# Patient Record
Sex: Female | Born: 1975 | Race: Black or African American | Hispanic: No | Marital: Single | Smoking: Never smoker
Health system: Southern US, Community
[De-identification: ages and names within clinical notes are randomized; demographics above are authoritative.]

## PROBLEM LIST (undated history)

## (undated) DIAGNOSIS — I8390 Asymptomatic varicose veins of unspecified lower extremity: Secondary | ICD-10-CM

## (undated) DIAGNOSIS — G43109 Migraine with aura, not intractable, without status migrainosus: Secondary | ICD-10-CM

## (undated) DIAGNOSIS — E119 Type 2 diabetes mellitus without complications: Secondary | ICD-10-CM

## (undated) HISTORY — DX: Asymptomatic varicose veins of unspecified lower extremity: I83.90

---

## 2010-09-26 ENCOUNTER — Emergency Department (HOSPITAL_COMMUNITY)
Admission: EM | Admit: 2010-09-26 | Discharge: 2010-09-27 | Disposition: A | Discharge status: Home / Self Care | Payer: 59 | Attending: Emergency Medicine | Admitting: Emergency Medicine

## 2010-09-26 DIAGNOSIS — M5430 Sciatica, unspecified side: Secondary | ICD-10-CM | POA: Insufficient documentation

## 2010-09-26 DIAGNOSIS — M545 Low back pain, unspecified: Secondary | ICD-10-CM | POA: Insufficient documentation

## 2010-09-26 DIAGNOSIS — N390 Urinary tract infection, site not specified: Secondary | ICD-10-CM | POA: Insufficient documentation

## 2010-09-27 LAB — URINE MICROSCOPIC-ADD ON

## 2010-09-27 LAB — URINALYSIS, ROUTINE W REFLEX MICROSCOPIC
Bilirubin Urine: NEGATIVE
Specific Gravity, Urine: 1.029 (ref 1.005–1.030)
Urobilinogen, UA: 0.2 mg/dL (ref 0.0–1.0)
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
## 2011-03-10 ENCOUNTER — Encounter: Payer: Self-pay | Admitting: Vascular Surgery

## 2011-03-12 ENCOUNTER — Other Ambulatory Visit: Payer: Self-pay

## 2011-03-12 DIAGNOSIS — I83893 Varicose veins of bilateral lower extremities with other complications: Secondary | ICD-10-CM

## 2011-03-20 ENCOUNTER — Encounter: Payer: Self-pay | Admitting: Vascular Surgery

## 2011-03-23 ENCOUNTER — Ambulatory Visit (INDEPENDENT_AMBULATORY_CARE_PROVIDER_SITE_OTHER): Payer: 59 | Admitting: Vascular Surgery

## 2011-03-23 ENCOUNTER — Encounter: Payer: Self-pay | Admitting: Vascular Surgery

## 2011-03-23 VITALS — BP 125/85 | HR 83 | Resp 18 | Ht 62.0 in | Wt 152.0 lb

## 2011-03-23 DIAGNOSIS — I83893 Varicose veins of bilateral lower extremities with other complications: Secondary | ICD-10-CM

## 2011-03-23 DIAGNOSIS — M79609 Pain in unspecified limb: Secondary | ICD-10-CM

## 2011-03-23 NOTE — Progress Notes (Signed)
Subjective:     Patient ID: Susan Guerrero, female   DOB: 1975-08-24, 35 y.o.   MRN: [id number]  [facility] [place] HISTORY AND PHYSICAL  Referring Physician:Dr Rankins History of Present Illness: This 35 year old healthy female was referred by Dr. Ephriam Knuckles for evaluation of venous insufficiency of both legs. 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 She has been having increasing aching throbbing and burning discomfort in both legs in the medial thigh and the back of the calf area bilaterally. She stands for 8-10 hours per day. She has recently started wearing short-leg elastic compression stockings with no improvement in her symptoms has no history of DVT or documented thrombophlebitis. He has no history of stasis ulcers or bleeding. He does have mild swelling in the ankles as the day progresses. Past Medical History  Diagnosis Date  . Asthma   . Varicose veins     ROS: [x]  Positive   [ ]  Negative   [ ]  All sytems reviewed and are negative  General: [ ]  Weight loss, [ ]  Fever, [ ]  chills Neurologic: [ ]  Dizziness, [ ]  Blackouts, [ ]  Seizure [ ]  Stroke, [ ]  "Mini stroke", [ ]  Slurred speech, [ ]  Temporary blindness; [ ]  weakness in arms or legs, [ ]  Hoarseness Cardiac: [ ]  Chest pain/pressure, [ ]  Shortness of breath at rest [x ] Shortness of breath with exertion, [ ]  Atrial fibrillation or irregular heartbeat Vascular: [ ]  Pain in legs with walking, [ ]  Pain in legs at rest, [ ]  Pain in legs at night,  [ ]  Non-healing ulcer, [ ]  Blood clot in vein/DVT,   Pulmonary: [ ]  Home oxygen, [ ]  Productive cough, [ ]  Coughing up blood, [x ] Asthma,  [x ] Wheezing Musculoskeletal:  [ ]  Arthritis, [ ]  Low back pain, [x ] Joint pain Hematologic: [ ]  Easy Bruising, [ ]  Anemia; [ ]  Hepatitis Gastrointestinal: [ ]  Blood in stool, [ ]  Gastroesophageal Reflux/heartburn, [ ]  Trouble swallowing Urinary: [ ]  chronic Kidney disease, [ ]  on HD - [ ]  MWF or [ ]   TTHS, [ ]  Burning with urination, [ ]  Difficulty urinating Skin: [ ]  Rashes, [ ]  Wounds Psychological: [ ]  Anxiety, [ ]  Depression   Social History History  Substance Use Topics  . Smoking status: Never Smoker   . Smokeless tobacco: Not on file  . Alcohol Use: Yes     occasionally    Family History Family History  Problem Relation Age of Onset  . Hypertension Father   . Migraines Sister   . Asthma Brother   . Obesity Brother   . Cancer Maternal Aunt     ovarian  . Cancer Maternal Grandmother     breast    Allergies  Allergen Reactions  . Flagyl (Metronidazole Hcl)     Causes stomach pain    Current Outpatient Prescriptions  Medication Sig Dispense Refill  . albuterol (VENTOLIN HFA) 108 (90 BASE) MCG/ACT inhaler Inhale 2 puffs into the lungs every 4 (four) hours as needed.        . Fluticasone-Salmeterol (ADVAIR) 100-50 MCG/DOSE AEPB Inhale 1 puff into the lungs every 12 (twelve) hours.        . naproxen sodium (ANAPROX) 220 MG tablet Take 220 mg by mouth as needed.        . clindamycin (CLEOCIN) 2 % vaginal cream Place 1 applicator vaginally at bedtime.        Marland Kitchen  omeprazole (PRILOSEC) 40 MG capsule Take 40 mg by mouth daily.        . prochlorperazine (COMPAZINE) 25 MG suppository Place 25 mg rectally every 12 (twelve) hours as needed.          Physical Examination  Filed Vitals:   03/23/11 1225  BP: 125/85  Pulse: 83  Resp: 18    Body mass index is 27.80 kg/(m^2).  General:  WDWN in NAD Gait: Normal HENT: WNL Eyes: Pupils equal Pulmonary: normal non-labored breathing , without Rales, rhonchi,  wheezing Cardiac: RRR, without  Murmurs, rubs or gallops; No carotid bruits Abdomen: soft, NT, no masses Skin: no rashes, ulcers noted Vascular Exam/Pulses: 23+ femoral popliteal and dorsalis pedis pulses palpable bilaterally. No significant distal edema is noted. She has some small reticular veins over the course of the great saphenous vein in the distal thigh left  worse than right. No bulging varicosities are noted. No hyperpigmentation or ulceration is noted. Extremities without ischemic changes, no Gangrene , no cellulitis; no open wounds;  Musculoskeletal: no muscle wasting or atrophy  Neurologic: A&O X 3; Appropriate Affect ; SENSATION: normal; MOTOR FUNCTION:  moving all extremities equally. Speech is fluent/normal  Non-Invasive Vascular Imaging: Today however a venous duplex exam of both lower extremities which I reviewed and interpreted to deep systems are essentially normal with the exception of mild reflux on the left side. There is some reflex in the right superficial system and the great saphenous vein but this vein is small. The left great saphenous vein and small saphenous veins are free of reflux.  ASSESSMENT: Symptomatic reticular veins both lower extremities in the medial thigh areas. PLAN: Patient will consider sclerotherapy for treatment of these areas. No laser ablation of greater saphenous or small saphenous systems is indicated   Review of Systems     Objective:   Physical Exam     Assessment:         Plan:

## 2011-03-23 NOTE — Progress Notes (Signed)
New VV

## 2011-03-30 NOTE — Procedures (Unsigned)
LOWER EXTREMITY VENOUS REFLUX EXAM  INDICATION:  Lower extremity varicose veins and lower extremity pain.  EXAM:  Using color-flow imaging and pulse Doppler spectral analysis, the bilateral common femoral, superficial femoral, popliteal, posterior tibial, greater and lesser saphenous veins are evaluated.  There is evidence suggesting deep venous insufficiency in the left popliteal vein.  The remainder appears normal without reflux.  The right saphenofemoral junction is not competent with Reflux of >582milliseconds. The left saphenofemoral junction is competent.  The right GSV is not competent with reflux >500 milliseconds with the caliber as described below.  The left GSV is competent.  The bilateral proximal small saphenous veins demonstrate competency.  GSV Diameter (used if found to be incompetent only)                                           Right    Left Proximal Greater Saphenous Vein           0.51 cm  cm Proximal-to-mid-thigh                     0.23 cm  cm Mid thigh                                 0.25 cm  cm Mid-distal thigh                          cm       cm Distal thigh                              0.35 cm  cm Knee                                      0.37 cm  cm  IMPRESSION: 1. The right greater saphenous vein is not competent with reflux     >57milliseconds. 2. The left greater saphenous vein is competent. 3. The bilateral greater saphenous veins are not tortuous. 4. The deep venous system of the right lower extremity is competent. 5. The deep venous system of the left lower extremity is not competent     with reflux >500 milliseconds. 6. The bilateral small saphenous veins are competent.  ___________________________________________ Quita Skye. Hart Rochester, M.D.  SH/MEDQ  D:  03/23/2011  T:  03/23/2011  Job:  [id number]

## 2012-08-21 ENCOUNTER — Emergency Department (HOSPITAL_COMMUNITY): Payer: 59

## 2012-08-21 ENCOUNTER — Encounter (HOSPITAL_COMMUNITY): Payer: Self-pay | Admitting: Emergency Medicine

## 2012-08-21 ENCOUNTER — Emergency Department (HOSPITAL_COMMUNITY)
Admission: EM | Admit: 2012-08-21 | Discharge: 2012-08-21 | Disposition: A | Discharge status: Home / Self Care | Payer: 59 | Attending: Emergency Medicine | Admitting: Emergency Medicine

## 2012-08-21 DIAGNOSIS — J45909 Unspecified asthma, uncomplicated: Secondary | ICD-10-CM | POA: Insufficient documentation

## 2012-08-21 DIAGNOSIS — S60511A Abrasion of right hand, initial encounter: Secondary | ICD-10-CM

## 2012-08-21 DIAGNOSIS — Z8679 Personal history of other diseases of the circulatory system: Secondary | ICD-10-CM | POA: Insufficient documentation

## 2012-08-21 DIAGNOSIS — S32591A Other specified fracture of right pubis, initial encounter for closed fracture: Secondary | ICD-10-CM

## 2012-08-21 DIAGNOSIS — S32509A Unspecified fracture of unspecified pubis, initial encounter for closed fracture: Secondary | ICD-10-CM | POA: Insufficient documentation

## 2012-08-21 DIAGNOSIS — IMO0002 Reserved for concepts with insufficient information to code with codable children: Secondary | ICD-10-CM | POA: Insufficient documentation

## 2012-08-21 DIAGNOSIS — Z792 Long term (current) use of antibiotics: Secondary | ICD-10-CM | POA: Insufficient documentation

## 2012-08-21 DIAGNOSIS — S0990XA Unspecified injury of head, initial encounter: Secondary | ICD-10-CM | POA: Insufficient documentation

## 2012-08-21 DIAGNOSIS — Z79899 Other long term (current) drug therapy: Secondary | ICD-10-CM | POA: Insufficient documentation

## 2012-08-21 DIAGNOSIS — Z23 Encounter for immunization: Secondary | ICD-10-CM | POA: Insufficient documentation

## 2012-08-21 DIAGNOSIS — Y9389 Activity, other specified: Secondary | ICD-10-CM | POA: Insufficient documentation

## 2012-08-21 DIAGNOSIS — Y92410 Unspecified street and highway as the place of occurrence of the external cause: Secondary | ICD-10-CM | POA: Insufficient documentation

## 2012-08-21 LAB — POCT I-STAT, CHEM 8
BUN: 9 mg/dL (ref 6–23)
Chloride: 110 mEq/L (ref 96–112)
Potassium: 3.4 mEq/L — ABNORMAL LOW (ref 3.5–5.1)
Sodium: 142 mEq/L (ref 135–145)
TCO2: 21 mmol/L (ref 0–100)

## 2012-08-21 LAB — COMPREHENSIVE METABOLIC PANEL
ALT: 18 U/L (ref 0–35)
AST: 37 U/L (ref 0–37)
CO2: 20 mEq/L (ref 19–32)
Calcium: 9 mg/dL (ref 8.4–10.5)
Sodium: 140 mEq/L (ref 135–145)
Total Protein: 7.1 g/dL (ref 6.0–8.3)

## 2012-08-21 LAB — CBC
HCT: 39.2 % (ref 36.0–46.0)
Hemoglobin: 13.6 g/dL (ref 12.0–15.0)
MCH: 29.3 pg (ref 26.0–34.0)
MCHC: 34.7 g/dL (ref 30.0–36.0)
MCV: 84.5 fL (ref 78.0–100.0)

## 2012-08-21 LAB — CG4 I-STAT (LACTIC ACID): Lactic Acid, Venous: 2.94 mmol/L — ABNORMAL HIGH (ref 0.5–2.2)

## 2012-08-21 LAB — URINALYSIS, MICROSCOPIC ONLY
Glucose, UA: NEGATIVE mg/dL
Ketones, ur: NEGATIVE mg/dL
Protein, ur: NEGATIVE mg/dL

## 2012-08-21 LAB — SAMPLE TO BLOOD BANK

## 2012-08-21 LAB — PROTIME-INR: Prothrombin Time: 12.9 seconds (ref 11.6–15.2)
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
## 2012-08-21 MED ORDER — SODIUM CHLORIDE 0.9 % IV SOLN
Freq: Once | INTRAVENOUS | Status: AC
  Administered 2012-08-21: 21:00:00 via INTRAVENOUS

## 2012-08-21 MED ORDER — LORAZEPAM 2 MG/ML IJ SOLN
1.0000 mg | Freq: Once | INTRAMUSCULAR | Status: DC
  Filled 2012-08-21: qty 1

## 2012-08-21 MED ORDER — MORPHINE SULFATE 4 MG/ML IJ SOLN
4.0000 mg | Freq: Once | INTRAMUSCULAR | Status: AC
  Administered 2012-08-21: 4 mg via INTRAVENOUS
  Filled 2012-08-21: qty 1

## 2012-08-21 MED ORDER — TETANUS-DIPHTH-ACELL PERTUSSIS 5-2.5-18.5 LF-MCG/0.5 IM SUSP
0.5000 mL | Freq: Once | INTRAMUSCULAR | Status: AC
Start: 2012-08-21 — End: 2012-08-21
  Administered 2012-08-21: 0.5 mL via INTRAMUSCULAR
  Filled 2012-08-21: qty 0.5

## 2012-08-21 MED ORDER — KETOROLAC TROMETHAMINE 30 MG/ML IJ SOLN
15.0000 mg | Freq: Once | INTRAMUSCULAR | Status: AC
  Administered 2012-08-21: 15 mg via INTRAVENOUS
  Filled 2012-08-21: qty 1

## 2012-08-21 MED ORDER — FENTANYL CITRATE 0.05 MG/ML IJ SOLN
50.0000 ug | Freq: Once | INTRAMUSCULAR | Status: AC
  Administered 2012-08-21: 50 ug via INTRAVENOUS
  Filled 2012-08-21: qty 2

## 2012-08-21 MED ORDER — SODIUM CHLORIDE 0.9 % IV BOLUS (SEPSIS)
1000.0000 mL | Freq: Once | INTRAVENOUS | Status: AC
  Administered 2012-08-21: 1000 mL via INTRAVENOUS

## 2012-08-21 MED ORDER — OXYCODONE-ACETAMINOPHEN 5-325 MG PO TABS: 1.0000 | ORAL_TABLET | ORAL | Status: DC | PRN

## 2012-08-21 MED ORDER — IOHEXOL 300 MG/ML  SOLN
100.0000 mL | Freq: Once | INTRAMUSCULAR | Status: AC | PRN
  Administered 2012-08-21: 100 mL via INTRAVENOUS
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
## 2012-08-21 NOTE — ED Notes (Signed)
Pt in xray

## 2012-08-21 NOTE — ED Notes (Signed)
Alert, NAD, calm, interactive, resps e/u, no changes, MAEx4, CMS intact, LS CTA, "feels about the same", pain med given, hand cleaned, dressed and bandaged. (saline, betadine, bacitracin, xeroform, telfa, gauze, kling and coban).
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
## 2012-08-21 NOTE — ED Notes (Signed)
Rx x 1.  Pt voiced understanding to f/u with PCP on Thursday and return for worsening condition.

## 2012-08-21 NOTE — ED Notes (Signed)
Dr. Bebe Shaggy in to room at [facility]

## 2012-08-21 NOTE — ED Notes (Signed)
Patient transported to CT 

## 2012-08-21 NOTE — ED Notes (Signed)
Patient has menstrual cycle at this time.

## 2012-08-22 ENCOUNTER — Telehealth (HOSPITAL_COMMUNITY): Payer: Self-pay | Admitting: *Deleted

## 2012-08-22 LAB — CDS SEROLOGY

## 2012-08-23 LAB — URINE CULTURE

## 2012-08-31 ENCOUNTER — Telehealth (HOSPITAL_COMMUNITY): Payer: Self-pay | Admitting: Emergency Medicine

## 2013-04-20 ENCOUNTER — Other Ambulatory Visit: Payer: Self-pay

## 2014-04-04 ENCOUNTER — Emergency Department (HOSPITAL_COMMUNITY)
Admission: EM | Admit: 2014-04-04 | Discharge: 2014-04-04 | Disposition: A | Discharge status: Home / Self Care | Payer: No Typology Code available for payment source | Attending: Emergency Medicine | Admitting: Emergency Medicine

## 2014-04-04 ENCOUNTER — Encounter (HOSPITAL_COMMUNITY): Payer: Self-pay | Admitting: Emergency Medicine

## 2014-04-04 DIAGNOSIS — M542 Cervicalgia: Secondary | ICD-10-CM | POA: Insufficient documentation

## 2014-04-04 DIAGNOSIS — M62830 Muscle spasm of back: Secondary | ICD-10-CM

## 2014-04-04 DIAGNOSIS — Z8679 Personal history of other diseases of the circulatory system: Secondary | ICD-10-CM | POA: Insufficient documentation

## 2014-04-04 DIAGNOSIS — Z79899 Other long term (current) drug therapy: Secondary | ICD-10-CM | POA: Insufficient documentation

## 2014-04-04 DIAGNOSIS — J45909 Unspecified asthma, uncomplicated: Secondary | ICD-10-CM | POA: Insufficient documentation

## 2014-04-04 MED ORDER — DIAZEPAM 5 MG PO TABS
5.0000 mg | ORAL_TABLET | Freq: Once | ORAL | Status: AC
  Administered 2014-04-04: 5 mg via ORAL
  Filled 2014-04-04: qty 1

## 2014-04-04 MED ORDER — TRAMADOL HCL 50 MG PO TABS: 50.0000 mg | ORAL_TABLET | Freq: Four times a day (QID) | ORAL | Status: DC | PRN

## 2014-04-04 MED ORDER — HYDROCODONE-ACETAMINOPHEN 5-325 MG PO TABS
2.0000 | ORAL_TABLET | Freq: Once | ORAL | Status: AC
  Administered 2014-04-04: 2 via ORAL
  Filled 2014-04-04: qty 2

## 2014-04-04 MED ORDER — MELOXICAM 7.5 MG PO TABS: 15.0000 mg | ORAL_TABLET | Freq: Every day | ORAL | Status: DC

## 2014-04-04 MED ORDER — DIAZEPAM 5 MG PO TABS: 5.0000 mg | ORAL_TABLET | Freq: Two times a day (BID) | ORAL | Status: DC

## 2014-04-04 NOTE — ED Notes (Signed)
Pt verbalizes she is leaving with ALL belongings she arrived with. She is alert and oriented. Her female visitor is driving her home.

## 2014-04-04 NOTE — Discharge Instructions (Signed)
Recommend Mobic as prescribed instead of ibuprofen or BC Powders. Take Valium for muscle spasms and tramadol as needed for severe pain. Alternate ice and heat to your back 4 times a day. Follow up with orthopedics. Followup with your primary doctor as needed for additional pain control.  Muscle Cramps and Spasms Muscle cramps and spasms occur when a muscle or muscles tighten and you have no control over this tightening (involuntary muscle contraction). They are a common problem and can develop in any muscle. The most common place is in the calf muscles of the leg. Both muscle cramps and muscle spasms are involuntary muscle contractions, but they also have differences:   Muscle cramps are sporadic and painful. They may last a few seconds to a quarter of an hour. Muscle cramps are often more forceful and last longer than muscle spasms.  Muscle spasms may or may not be painful. They may also last just a few seconds or much longer. CAUSES  It is uncommon for cramps or spasms to be due to a serious underlying problem. In many cases, the cause of cramps or spasms is unknown. Some common causes are:   Overexertion.   Overuse from repetitive motions (doing the same thing over and over).   Remaining in a certain position for a long period of time.   Improper preparation, form, or technique while performing a sport or activity.   Dehydration.   Injury.   Side effects of some medicines.   Abnormally low levels of the salts and ions in your blood (electrolytes), especially potassium and calcium. This could happen if you are taking water pills (diuretics) or you are pregnant.  Some underlying medical problems can make it more likely to develop cramps or spasms. These include, but are not limited to:   Diabetes.   Parkinson disease.   Hormone disorders, such as thyroid problems.   Alcohol abuse.   Diseases specific to muscles, joints, and bones.   Blood vessel disease where not  enough blood is getting to the muscles.  HOME CARE INSTRUCTIONS   Stay well hydrated. Drink enough water and fluids to keep your urine clear or pale yellow.  It may be helpful to massage, stretch, and relax the affected muscle.  For tight or tense muscles, use a warm towel, heating pad, or hot shower water directed to the affected area.  If you are sore or have pain after a cramp or spasm, applying ice to the affected area may relieve discomfort.  Put ice in a plastic bag.  Place a towel between your skin and the bag.  Leave the ice on for 15-20 minutes, 03-04 times a day.  Medicines used to treat a known cause of cramps or spasms may help reduce their frequency or severity. Only take over-the-counter or prescription medicines as directed by your caregiver. SEEK MEDICAL CARE IF:  Your cramps or spasms get more severe, more frequent, or do not improve over time.  MAKE SURE YOU:   Understand these instructions.  Will watch your condition.  Will get help right away if you are not doing well or get worse. Document Released: 11/21/2001 Document Revised: 09/26/2012 Document Reviewed: 05/18/2012 [facility] Patient Information 2015 [place], [place]. This information is not intended to replace advice given to you by your health care provider. Make sure you discuss any questions you have with your health care provider.
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
## 2014-04-05 NOTE — ED Provider Notes (Signed)
Medical screening examination/treatment/procedure(s) were performed by non-physician practitioner and as supervising physician I was immediately available for consultation/collaboration.   EKG Interpretation None       Jasper Riling. Alvino Chapel, MD 04/05/14 8251

## 2014-07-09 ENCOUNTER — Encounter (HOSPITAL_COMMUNITY): Payer: Self-pay | Admitting: Emergency Medicine

## 2014-07-09 ENCOUNTER — Emergency Department (HOSPITAL_COMMUNITY)
Admission: EM | Admit: 2014-07-09 | Discharge: 2014-07-10 | Disposition: A | Discharge status: Home / Self Care | Payer: Managed Care, Other (non HMO) | Attending: Emergency Medicine | Admitting: Emergency Medicine

## 2014-07-09 DIAGNOSIS — E876 Hypokalemia: Secondary | ICD-10-CM

## 2014-07-09 DIAGNOSIS — Z79899 Other long term (current) drug therapy: Secondary | ICD-10-CM | POA: Insufficient documentation

## 2014-07-09 DIAGNOSIS — R112 Nausea with vomiting, unspecified: Secondary | ICD-10-CM | POA: Diagnosis not present

## 2014-07-09 DIAGNOSIS — R0789 Other chest pain: Secondary | ICD-10-CM | POA: Diagnosis not present

## 2014-07-09 DIAGNOSIS — R51 Headache: Secondary | ICD-10-CM

## 2014-07-09 DIAGNOSIS — Z8679 Personal history of other diseases of the circulatory system: Secondary | ICD-10-CM | POA: Diagnosis not present

## 2014-07-09 DIAGNOSIS — H9203 Otalgia, bilateral: Secondary | ICD-10-CM | POA: Insufficient documentation

## 2014-07-09 DIAGNOSIS — M791 Myalgia, unspecified site: Secondary | ICD-10-CM

## 2014-07-09 DIAGNOSIS — Z791 Long term (current) use of non-steroidal anti-inflammatories (NSAID): Secondary | ICD-10-CM | POA: Diagnosis not present

## 2014-07-09 DIAGNOSIS — R1084 Generalized abdominal pain: Secondary | ICD-10-CM

## 2014-07-09 DIAGNOSIS — R197 Diarrhea, unspecified: Secondary | ICD-10-CM | POA: Insufficient documentation

## 2014-07-09 DIAGNOSIS — Z3202 Encounter for pregnancy test, result negative: Secondary | ICD-10-CM | POA: Insufficient documentation

## 2014-07-09 DIAGNOSIS — J069 Acute upper respiratory infection, unspecified: Secondary | ICD-10-CM

## 2014-07-09 DIAGNOSIS — R509 Fever, unspecified: Secondary | ICD-10-CM | POA: Diagnosis present

## 2014-07-09 DIAGNOSIS — J45909 Unspecified asthma, uncomplicated: Secondary | ICD-10-CM | POA: Diagnosis not present

## 2014-07-09 DIAGNOSIS — R0981 Nasal congestion: Secondary | ICD-10-CM

## 2014-07-09 DIAGNOSIS — J029 Acute pharyngitis, unspecified: Secondary | ICD-10-CM

## 2014-07-09 DIAGNOSIS — R519 Headache, unspecified: Secondary | ICD-10-CM

## 2014-07-09 LAB — CBC WITH DIFFERENTIAL/PLATELET
BASOS PCT: 0 % (ref 0–1)
Basophils Absolute: 0 10*3/uL (ref 0.0–0.1)
Eosinophils Absolute: 0 10*3/uL (ref 0.0–0.7)
Eosinophils Relative: 1 % (ref 0–5)
HEMATOCRIT: 44.4 % (ref 36.0–46.0)
HEMOGLOBIN: 14.6 g/dL (ref 12.0–15.0)
Lymphocytes Relative: 41 % (ref 12–46)
Lymphs Abs: 3 10*3/uL (ref 0.7–4.0)
MCH: 28.9 pg (ref 26.0–34.0)
MCHC: 32.9 g/dL (ref 30.0–36.0)
MCV: 87.7 fL (ref 78.0–100.0)
MONOS PCT: 8 % (ref 3–12)
Monocytes Absolute: 0.6 10*3/uL (ref 0.1–1.0)
NEUTROS PCT: 50 % (ref 43–77)
Neutro Abs: 3.7 10*3/uL (ref 1.7–7.7)
PLATELETS: 287 10*3/uL (ref 150–400)
RBC: 5.06 MIL/uL (ref 3.87–5.11)
RDW: 12.6 % (ref 11.5–15.5)
WBC: 7.4 10*3/uL (ref 4.0–10.5)

## 2014-07-09 LAB — COMPREHENSIVE METABOLIC PANEL
ALBUMIN: 4.7 g/dL (ref 3.5–5.2)
ALK PHOS: 77 U/L (ref 39–117)
ALT: 22 U/L (ref 0–35)
AST: 30 U/L (ref 0–37)
Anion gap: 15 (ref 5–15)
BUN: 11 mg/dL (ref 6–23)
CALCIUM: 9.6 mg/dL (ref 8.4–10.5)
CO2: 24 mmol/L (ref 19–32)
Chloride: 112 mmol/L (ref 96–112)
Creatinine, Ser: 1.01 mg/dL (ref 0.50–1.10)
GFR calc Af Amer: 82 mL/min — ABNORMAL LOW (ref >90)
GFR calc non Af Amer: 71 mL/min — ABNORMAL LOW (ref >90)
GLUCOSE: 95 mg/dL (ref 70–99)
POTASSIUM: 3.6 mmol/L (ref 3.5–5.1)
SODIUM: 151 mmol/L — AB (ref 135–145)
Total Bilirubin: 0.5 mg/dL (ref 0.3–1.2)
Total Protein: 8.3 g/dL (ref 6.0–8.3)

## 2014-07-09 LAB — URINE MICROSCOPIC-ADD ON

## 2014-07-09 LAB — LIPASE, BLOOD: Lipase: 30 U/L (ref 11–59)

## 2014-07-09 LAB — URINALYSIS, ROUTINE W REFLEX MICROSCOPIC
Glucose, UA: NEGATIVE mg/dL
Ketones, ur: 15 mg/dL — AB
Leukocytes, UA: NEGATIVE
Nitrite: NEGATIVE
PROTEIN: 30 mg/dL — AB
SPECIFIC GRAVITY, URINE: 1.034 — AB (ref 1.005–1.030)
Urobilinogen, UA: 1 mg/dL (ref 0.0–1.0)
pH: 5.5 (ref 5.0–8.0)

## 2014-07-09 LAB — PREGNANCY, URINE: PREG TEST UR: NEGATIVE

## 2014-07-09 MED ORDER — SODIUM CHLORIDE 0.9 % IV BOLUS (SEPSIS)
1000.0000 mL | Freq: Once | INTRAVENOUS | Status: AC
  Administered 2014-07-10: 1000 mL via INTRAVENOUS

## 2014-07-09 MED ORDER — ONDANSETRON HCL 4 MG/2ML IJ SOLN
4.0000 mg | Freq: Once | INTRAMUSCULAR | Status: AC
  Administered 2014-07-10: 4 mg via INTRAVENOUS
  Filled 2014-07-09: qty 2

## 2014-07-09 NOTE — ED Notes (Addendum)
Pt c/o n/vd, abd pain and fever x 1 week .

## 2014-07-09 NOTE — ED Provider Notes (Signed)
CSN: [id number]     Arrival date & time 07/09/14  1839 History   First MD Initiated Contact with Patient 07/09/14 2331     Chief Complaint  Patient presents with  . Emesis  . Fever     (Consider location/radiation/quality/duration/timing/severity/associated sxs/prior Treatment) HPI Comments: Susan Guerrero is a 39 y.o. female with a PMHx of asthma and varicose veins, who presents to the ED with complaints of 1 week of flulike symptoms which include body aches, cough with yellow sputum production, sore throat, sinus congestion, b/l ear pressure, generalized abdominal pain, nausea, vomiting, diarrhea, subjective fevers of 102 oral which have resolved, headache, and chest tightness. She states her symptoms started 1 week ago, beginning with body aches and fevers, for which she has been taking NyQuil and airborne with no relief. Her fevers have since subsided. She describes her chest tightness as 8/10 centrally located nonradiating worse with coughing and unrelieved with NyQuil and Motrin. Her headache is 7/10 frontal constant throbbing, nonradiating, worse with coughing, unrelieved with Motrin which she last took at 2 AM, and similar to prior headaches although she admits this is less severe than her normal headaches. Denies any thunderclap onset. Emesis has been 2 times today, nonbloody nonbilious with stomach contents only. She has had proximally for episodes of loose watery nonbloody diarrhea. Abdominal pain is generalized constant worse with vomiting and nonradiating. She states that she works around a lot of people, and is unsure if she may have picked up something at work. She reports that 3 days ago her daughter began to have similar symptoms and she believes she gave this to her. She denies any ear drainage, vision changes, dizziness, lightheadedness, syncope, shortness of breath, wheezing, leg swelling, hemoptysis, drooling, tinnitus, dysuria, hematuria, vaginal bleeding or discharge, recent  travel, or alcohol use. She denies any estrogen use and her last menstrual period was 06/15/2014.   Patient is a 39 y.o. female presenting with vomiting and fever. The history is provided by the patient. No language interpreter was used.  Emesis Severity:  Mild Duration:  1 week Timing:  Intermittent Number of daily episodes:  2 Quality:  Stomach contents Progression:  Unchanged Chronicity:  New Recent urination:  Decreased Relieved by:  None tried Worsened by:  Nothing tried Ineffective treatments:  None tried Associated symptoms: abdominal pain (generalized), arthralgias (generalized), diarrhea, headaches, myalgias (generalized body aches) and sore throat   Associated symptoms: no chills   Risk factors: sick contacts   Risk factors: no suspect food intake and no travel to endemic areas   Fever Associated symptoms: congestion, cough (yellow phlegm), diarrhea, ear pain (bilateral ear pressure), headaches, myalgias (generalized body aches), nausea, sore throat and vomiting   Associated symptoms: no chills, no confusion, no dysuria, no rash and no rhinorrhea     Past Medical History  Diagnosis Date  . Asthma   . Varicose veins    History reviewed. No pertinent past surgical history. Family History  Problem Relation Age of Onset  . Hypertension Father   . Migraines Sister   . Asthma Brother   . Obesity Brother   . Cancer Maternal Aunt     ovarian  . Cancer Maternal Grandmother     breast   History  Substance Use Topics  . Smoking status: Never Smoker   . Smokeless tobacco: Not on file  . Alcohol Use: Yes     Comment: occasionally   OB History    No data available     Review of  Systems  Constitutional: Positive for fever. Negative for chills.  HENT: Positive for congestion, ear pain (bilateral ear pressure), sinus pressure and sore throat. Negative for drooling, ear discharge, rhinorrhea, tinnitus and trouble swallowing.   Eyes: Negative for photophobia, pain,  discharge, itching and visual disturbance.  Respiratory: Positive for cough (yellow phlegm) and chest tightness (anteriorly). Negative for shortness of breath and wheezing.   Cardiovascular: Negative for palpitations and leg swelling.  Gastrointestinal: Positive for nausea, vomiting, abdominal pain (generalized) and diarrhea. Negative for constipation, blood in stool and anal bleeding.  Genitourinary: Positive for decreased urine volume. Negative for dysuria, hematuria, vaginal bleeding and vaginal discharge.  Musculoskeletal: Positive for myalgias (generalized body aches) and arthralgias (generalized). Negative for back pain, joint swelling, neck pain and neck stiffness.  Skin: Negative for rash.  Allergic/Immunologic: Positive for environmental allergies. Negative for immunocompromised state.  Neurological: Positive for headaches. Negative for dizziness, syncope, weakness, light-headedness and numbness.  Psychiatric/Behavioral: Negative for confusion.   10 Systems reviewed and are negative for acute change except as noted in the HPI.    Allergies  Flagyl  Home Medications   Prior to Admission medications   Medication Sig Start Date End Date Taking? Authorizing Provider  albuterol (VENTOLIN HFA) 108 (90 BASE) MCG/ACT inhaler Inhale 2 puffs into the lungs every 4 (four) hours as needed for shortness of breath.    Yes Historical Provider, MD  ibuprofen (ADVIL,MOTRIN) 200 MG tablet Take 400 mg by mouth every 6 (six) hours as needed for moderate pain.   Yes Historical Provider, MD  diazepam (VALIUM) 5 MG tablet Take 1 tablet (5 mg total) by mouth 2 (two) times daily. Patient not taking: Reported on 07/09/2014 04/04/14   Antonietta Breach, PA-C  meloxicam (MOBIC) 7.5 MG tablet Take 2 tablets (15 mg total) by mouth daily. Patient not taking: Reported on 07/09/2014 04/04/14   Antonietta Breach, PA-C  traMADol (ULTRAM) 50 MG tablet Take 1 tablet (50 mg total) by mouth every 6 (six) hours as needed. Patient  not taking: Reported on 07/09/2014 04/04/14   Antonietta Breach, PA-C   BP 114/80 mmHg  Pulse 65  Temp(Src) 98.3 F (36.8 C) (Oral)  Resp 20  SpO2 98%  LMP 06/14/2014 (Exact Date) Physical Exam  Constitutional: She is oriented to person, place, and time. Vital signs are normal. She appears well-developed and well-nourished.  Non-toxic appearance. No distress.  Afebrile, nontoxic, NAD, VSS  HENT:  Head: Normocephalic and atraumatic.  Right Ear: Hearing, tympanic membrane, external ear and ear canal normal.  Left Ear: Hearing, tympanic membrane, external ear and ear canal normal.  Nose: Mucosal edema and rhinorrhea present. Right sinus exhibits maxillary sinus tenderness. Left sinus exhibits maxillary sinus tenderness.  Mouth/Throat: Uvula is midline and oropharynx is clear and moist. Mucous membranes are dry. No trismus in the jaw. No uvula swelling. No oropharyngeal exudate, posterior oropharyngeal edema, posterior oropharyngeal erythema or tonsillar abscesses.  Ears clear bilaterally Nose with mild mucosal edema, clear rhinorrhea, mild maxillary sinus TTP Oropharynx clear with dry mucous membranes, no tonsillar swelling/exudates, no erythema, no PTA. Uvula midline without deviation  Eyes: Conjunctivae and EOM are normal. Pupils are equal, round, and reactive to light. Right eye exhibits no discharge. Left eye exhibits no discharge.  PERRL, EOMI without nystagmus  Neck: Normal range of motion. Neck supple.  FROM intact without spinous process or paraspinous muscle TTP, no bony stepoffs or deformities, no muscle spasms. No rigidity or meningeal signs. No bruising or swelling.   Cardiovascular: Normal rate,  regular rhythm, normal heart sounds and intact distal pulses.  Exam reveals no gallop and no friction rub.   No murmur heard. RRR, nl s1/s2, no m/r/g, distal pulses intact, no pedal edema   Pulmonary/Chest: Effort normal and breath sounds normal. No respiratory distress. She has no decreased  breath sounds. She has no wheezes. She has no rhonchi. She has no rales. She exhibits tenderness. She exhibits no crepitus, no deformity and no retraction.    CTAB in all lung fields, no w/r/r, no hypoxia or increased WOB Mild TTP to anterior chest wall, no crepitus or deformity, no retractions SpO2 98% on RA  Abdominal: Soft. Normal appearance and bowel sounds are normal. She exhibits no distension. There is generalized tenderness. There is no rigidity, no rebound, no guarding, no CVA tenderness, no tenderness at McBurney's point and negative Murphy's sign.  Soft, ND, +BS throughout, with very mild generalized TTP, no r/g/r, neg murphy's, neg mcburney's, no CVA TTP   Musculoskeletal: Normal range of motion.  MAE x4 Gait steady Strength 5/5 in all extremities Sensation grossly intact in all extremities Distal pulses intact  Lymphadenopathy:       Head (right side): No submandibular and no tonsillar adenopathy present.       Head (left side): No submandibular and no tonsillar adenopathy present.    She has cervical adenopathy.  Shotty anterior cervical LAD which is mildly TTP, no tonsillar/submandibular LAD  Neurological: She is alert and oriented to person, place, and time. She has normal strength. No cranial nerve deficit or sensory deficit. Coordination and gait normal. GCS eye subscore is 4. GCS verbal subscore is 5. GCS motor subscore is 6.  CN 2-12 grossly intact A&O x4 GCS 15 Sensation and strength intact Gait nonataxic Coordination with finger-to-nose WNL  Skin: Skin is warm, dry and intact. No rash noted.  Psychiatric: She has a normal mood and affect.  Nursing note and vitals reviewed.   ED Course  Procedures (including critical care time) Labs Review Labs Reviewed  COMPREHENSIVE METABOLIC PANEL - Abnormal; Notable for the following:    Sodium 151 (*)    GFR calc non Af Amer 71 (*)    GFR calc Af Amer 82 (*)    All other components within normal limits  URINALYSIS,  ROUTINE W REFLEX MICROSCOPIC - Abnormal; Notable for the following:    Color, Urine AMBER (*)    APPearance CLOUDY (*)    Specific Gravity, Urine 1.034 (*)    Hgb urine dipstick SMALL (*)    Bilirubin Urine SMALL (*)    Ketones, ur 15 (*)    Protein, ur 30 (*)    All other components within normal limits  URINE MICROSCOPIC-ADD ON - Abnormal; Notable for the following:    Squamous Epithelial / LPF MANY (*)    Bacteria, UA FEW (*)    All other components within normal limits  I-STAT CHEM 8, ED - Abnormal; Notable for the following:    Potassium 3.2 (*)    Calcium, Ion 1.04 (*)    All other components within normal limits  RAPID STREP SCREEN  CULTURE, GROUP A STREP  LIPASE, BLOOD  CBC WITH DIFFERENTIAL/PLATELET  PREGNANCY, URINE  I-STAT TROPOININ, ED    Imaging Review Dg Chest 2 View  07/10/2014   CLINICAL DATA:  Cough, congestion, chest tightness, and fever for 6 days.  EXAM: CHEST  2 VIEW  COMPARISON:  08/21/2012  FINDINGS: Normal heart size and pulmonary vascularity. No focal airspace disease or  consolidation in the lungs. No blunting of costophrenic angles. No pneumothorax. Mediastinal contours appear intact. Mild thoracolumbar scoliosis convex towards the right.  IMPRESSION: No active cardiopulmonary disease.   Electronically Signed   By: Lucienne Capers M.D.   On: 07/10/2014 00:29     EKG Interpretation   Date/Time:  Tuesday July 10 2014 01:51:05 EST Ventricular Rate:  69 PR Interval:  171 QRS Duration: 70 QT Interval:  391 QTC Calculation: 419 R Axis:   86 Text Interpretation:  Sinus rhythm Borderline repol abnrm, anterior leads  No old tracing to compare Confirmed by Kathrynn Humble, MD, Thelma Comp [phone]) on  07/10/2014 1:55:37 AM      MDM   Final diagnoses:  Chest tightness  URI (upper respiratory infection)  Sinus congestion  Sinus headache  Myalgia  Hypokalemia  Sore throat  Generalized abdominal pain  Nausea, vomiting, and diarrhea    39 y.o. female with  URI symptoms, body aches, headaches, generalized abd pain/n/v/d. States her HA is less severe than typical headache, no red flag s/sx, doubt need for imaging. Nonfocal neuro exam. Upreg neg, U/A showing dehydration and contamination but no UTI. CMP with Na 151 but I believe this may be a lab error, will check istat chem 8. Lipase WNL. CBC w/diff unremarkable. Will obtain trop, EKG, CXR, and rapid strep. Will give zofran, bolus IVFs, and toradol. Likely viral illness. No SOB, hypoxia, or wheezing, doubt need for nebs. Doubt DVT/PE/ACS. Reproducible chest wall tenderness likely costochondritis. Will reassess shortly.   1:54 AM Chem 8 showing normal sodium, slight hypokalemia at 3.2 therefore will give kdur at discharge. Trop neg. EKG NSR. CXR WNL. Meds, IVFs, and rapid strep hasn't been done yet. Discussed with nursing, who state they're starting IV now, will reassess shortly.   2:40 AM RST neg. Symptoms overall improved, nausea improved but she states she tried to consume a small amount of ginger ale and states it came back up. Doesn't feel nauseated right now, but given that she didn't tolerate PO, will give phenergan and reattempt.   3:19 AM Pt care signed over to [facility]. If pt tolerates PO, she can be sent home with phenergan suppositories, kdur, naprosyn, and norco. Discussed symptomatic care and recheck at PCP in 1wk. Discussed using her home meds. I explained the diagnosis and have given explicit precautions to return to the ER including for any other new or worsening symptoms. The patient understands and accepts the medical plan as it's been dictated and I have answered their questions. Discharge instructions concerning home care and prescriptions have been given. The patient is STABLE and is discharged to home in good condition unless she fails her PO challenge now, in which case please see Antonietta Breach' documentation.  BP 114/80 mmHg  Pulse 65  Temp(Src) 98.3 F (36.8 C) (Oral)  Resp  20  SpO2 98%  LMP 06/14/2014 (Exact Date)  Meds ordered this encounter  Medications  . sodium chloride 0.9 % bolus 1,000 mL    Sig:   . ondansetron (ZOFRAN) injection 4 mg    Sig:   . ketorolac (TORADOL) 30 MG/ML injection 30 mg    Sig:   . promethazine (PHENERGAN) injection 25 mg    Sig:   . promethazine (PHENERGAN) 25 MG suppository    Sig: Place 1 suppository (25 mg total) rectally every 6 (six) hours as needed for nausea or vomiting.    Dispense:  12 suppository    Refill:  0  . potassium chloride SA (  K-DUR,KLOR-CON) 20 MEQ tablet    Sig: Take 1 tablet (20 mEq total) by mouth daily.    Dispense:  3 tablet    Refill:  0  . naproxen (NAPROSYN) 500 MG tablet    Sig: Take 1 tablet (500 mg total) by mouth 2 (two) times daily as needed for mild pain, moderate pain or headache (TAKE WITH MEALS.).    Dispense:  20 tablet    Refill:  0  . HYDROcodone-acetaminophen (NORCO) 5-325 MG per tablet    Sig: Take 1 tablet by mouth every 6 (six) hours as needed for severe pain.    Dispense:  6 tablet    Refill:  [id number] [place]Soms, PA-C 07/10/14 0320  Varney Biles, MD 07/11/14 2149

## 2014-07-10 ENCOUNTER — Emergency Department (HOSPITAL_COMMUNITY): Payer: Managed Care, Other (non HMO)

## 2014-07-10 DIAGNOSIS — J069 Acute upper respiratory infection, unspecified: Secondary | ICD-10-CM | POA: Diagnosis not present

## 2014-07-10 LAB — RAPID STREP SCREEN (MED CTR MEBANE ONLY): Streptococcus, Group A Screen (Direct): NEGATIVE

## 2014-07-10 LAB — I-STAT TROPONIN, ED: TROPONIN I, POC: 0 ng/mL (ref 0.00–0.08)

## 2014-07-10 LAB — I-STAT CHEM 8, ED
BUN: 10 mg/dL (ref 6–23)
CHLORIDE: 105 mmol/L (ref 96–112)
Calcium, Ion: 1.04 mmol/L — ABNORMAL LOW (ref 1.12–1.23)
Creatinine, Ser: 0.8 mg/dL (ref 0.50–1.10)
Glucose, Bld: 84 mg/dL (ref 70–99)
HEMATOCRIT: 43 % (ref 36.0–46.0)
HEMOGLOBIN: 14.6 g/dL (ref 12.0–15.0)
POTASSIUM: 3.2 mmol/L — AB (ref 3.5–5.1)
Sodium: 140 mmol/L (ref 135–145)
TCO2: 20 mmol/L (ref 0–100)

## 2014-07-10 IMAGING — CR DG CHEST 2V
2 series · 2 of 2 positions shown · non-contrast
Comparison: [DATE]

CLINICAL DATA: Cough, congestion, chest tightness, and fever for 6
days.

EXAM:
CHEST  2 VIEW

[w chest pa]
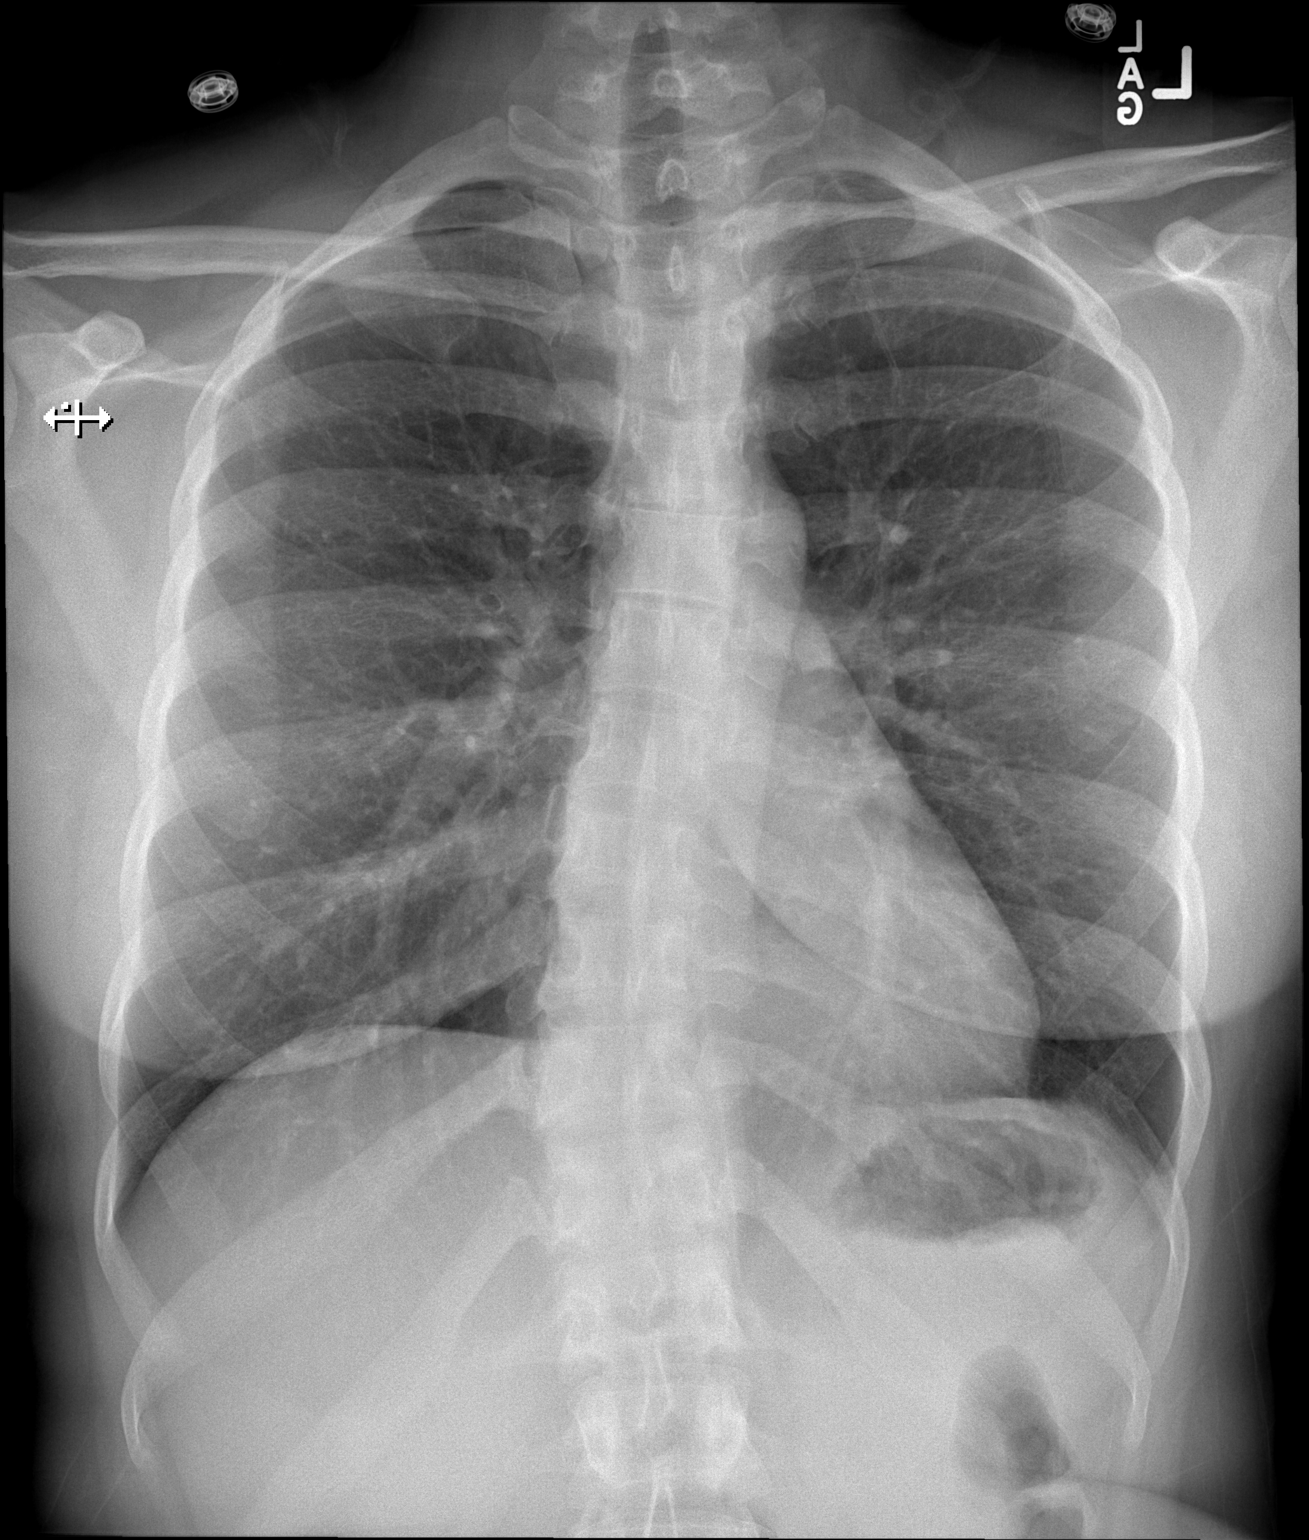

[w chest lat]
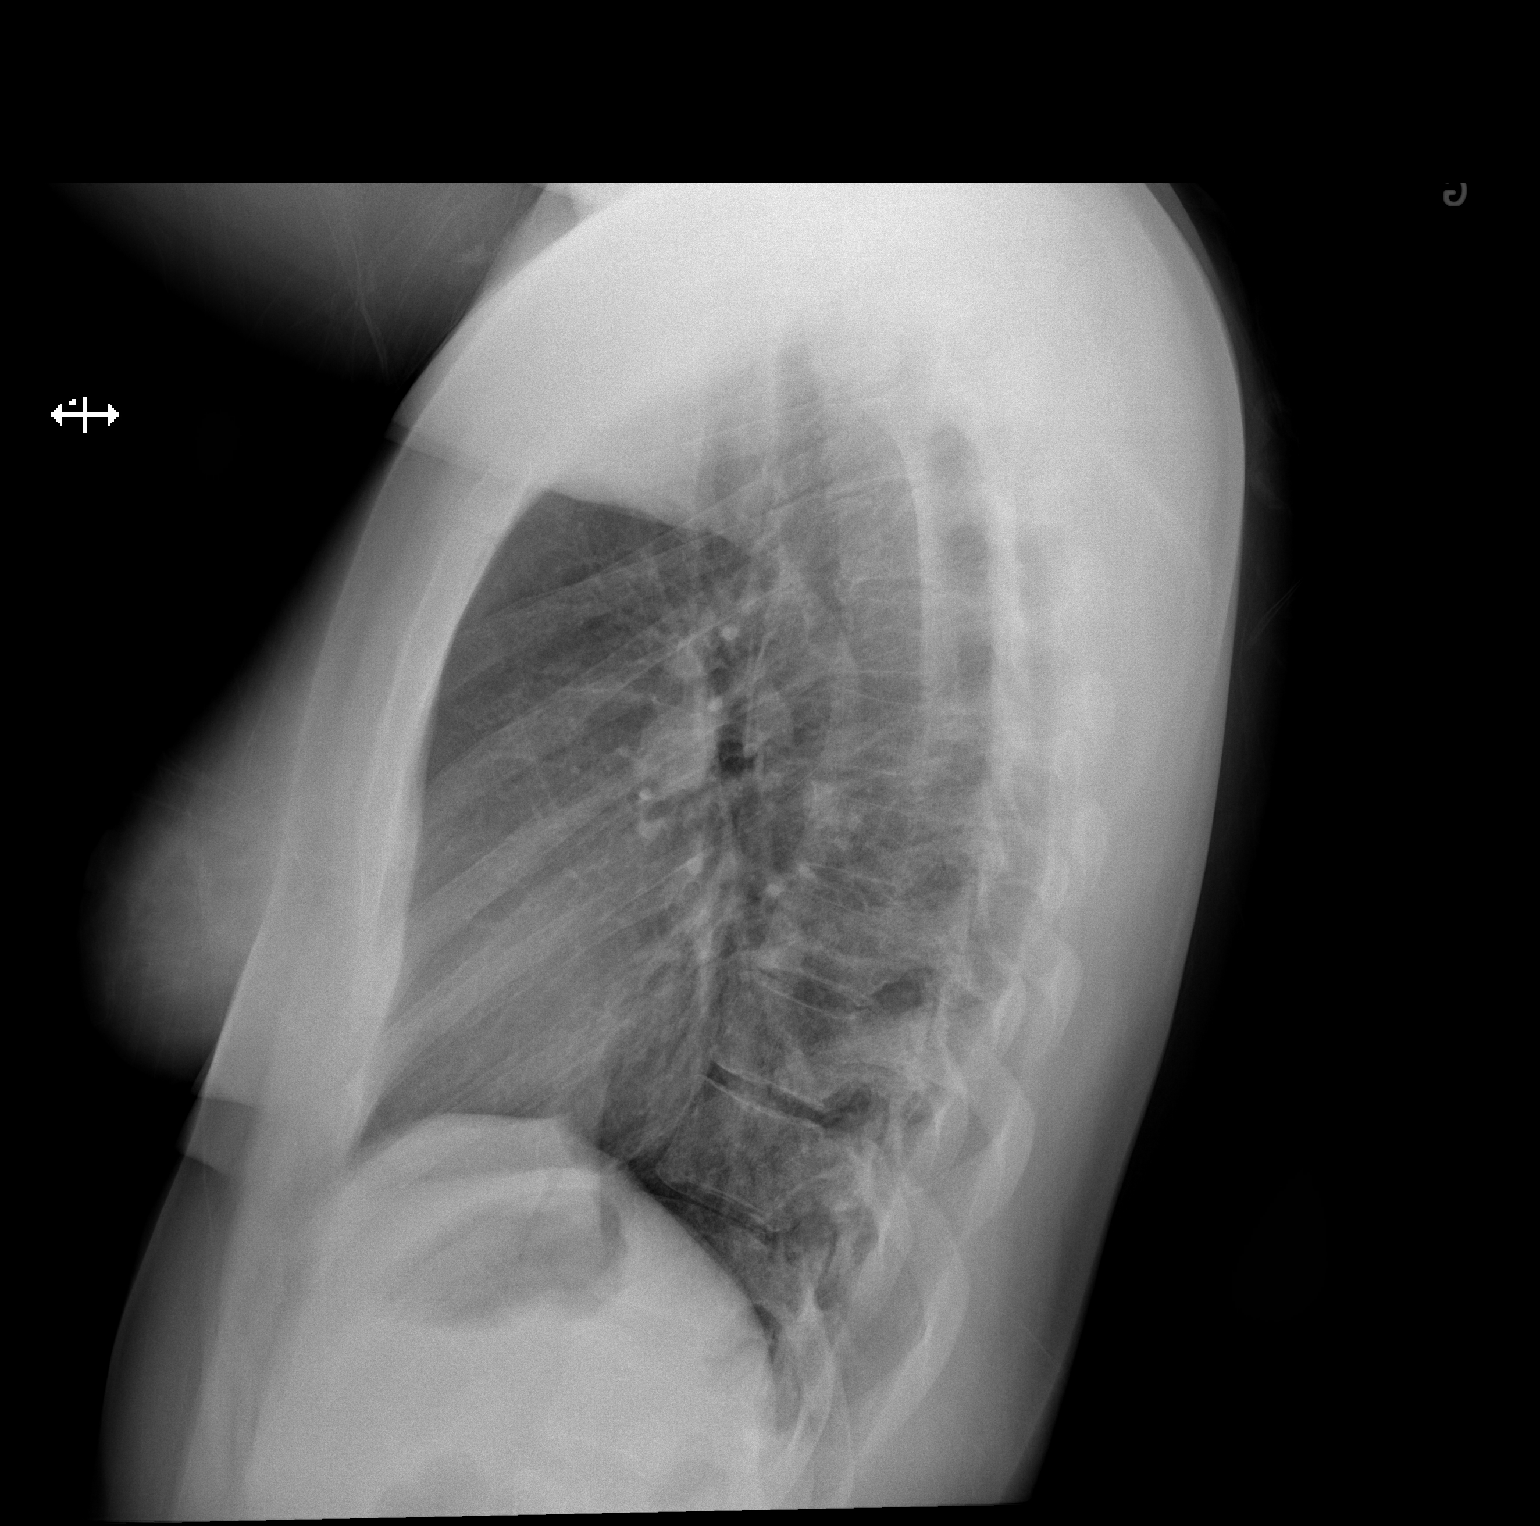

[2 of 2 positions shown; findings below may reference images not displayed]

FINDINGS: Normal heart size and pulmonary vascularity. No focal airspace
disease or consolidation in the lungs. No blunting of costophrenic
angles. No pneumothorax. Mediastinal contours appear intact. Mild
thoracolumbar scoliosis convex towards the right.
IMPRESSION: No active cardiopulmonary disease.

## 2014-07-10 MED ORDER — PROMETHAZINE HCL 25 MG/ML IJ SOLN
25.0000 mg | Freq: Once | INTRAMUSCULAR | Status: AC
  Administered 2014-07-10: 25 mg via INTRAVENOUS
  Filled 2014-07-10: qty 1

## 2014-07-10 MED ORDER — POTASSIUM CHLORIDE CRYS ER 20 MEQ PO TBCR: 20.0000 meq | EXTENDED_RELEASE_TABLET | Freq: Every day | ORAL | Status: DC

## 2014-07-10 MED ORDER — PROMETHAZINE HCL 25 MG RE SUPP: 25.0000 mg | Freq: Four times a day (QID) | RECTAL | Status: DC | PRN

## 2014-07-10 MED ORDER — HYDROCODONE-ACETAMINOPHEN 5-325 MG PO TABS: 1.0000 | ORAL_TABLET | Freq: Four times a day (QID) | ORAL | Status: DC | PRN

## 2014-07-10 MED ORDER — NAPROXEN 500 MG PO TABS: 500.0000 mg | ORAL_TABLET | Freq: Two times a day (BID) | ORAL | Status: DC | PRN

## 2014-07-10 MED ORDER — KETOROLAC TROMETHAMINE 30 MG/ML IJ SOLN
30.0000 mg | Freq: Once | INTRAMUSCULAR | Status: AC
  Administered 2014-07-10: 30 mg via INTRAVENOUS
  Filled 2014-07-10: qty 1

## 2014-07-10 NOTE — ED Provider Notes (Signed)
0435 - Patient care assumed from [facility], PA-C at shift change with plan to d/c when tolerating POs. Informed by nurse that patient has tolerated PO fluids without further emesis. She is stable for discharged at this time with instruction to f/u with her PCP; VSS.  Filed Vitals:   07/09/14 1852 07/09/14 2253  BP: 107/73 114/80  Pulse: 85 65  Temp: 98.1 F (36.7 C) 98.3 F (36.8 C)  TempSrc: Oral Oral  Resp: 22 20  SpO2: 96% 98%     Antonietta Breach, PA-C 07/10/14 [id number]

## 2014-07-10 NOTE — Discharge Instructions (Signed)
Continue to stay well-hydrated. Gargle warm salt water and spit it out. Continue to alternate between Tylenol and naprosyn for pain, headache, or fever. Use norco as directed as needed for severe pain but don't drive while taking this.  Use your normal home medications and inhalers. Use Mucinex for cough suppression/expectoration of mucus. Use netipot and flonase to help with nasal congestion. May consider over-the-counter Benadryl or other antihistamine to decrease secretions and for watery itchy eyes. Use phenergan as directed as needed for nausea. Follow the BRAT diet as described below to help with diarrhea. Followup with your primary care doctor in 5-7 days for recheck of ongoing symptoms. Return to emergency department for emergent changing or worsening of symptoms.   Viral Infections A virus is a type of germ. Viruses can cause:  Minor sore throats.  Aches and pains.  Headaches.  Runny nose.  Rashes.  Watery eyes.  Tiredness.  Coughs.  Loss of appetite.  Feeling sick to your stomach (nausea).  Throwing up (vomiting).  Watery poop (diarrhea). HOME CARE   Only take medicines as told by your doctor.  Drink enough water and fluids to keep your pee (urine) clear or pale yellow. Sports drinks are a good choice.  Get plenty of rest and eat healthy. Soups and broths with crackers or rice are fine. GET HELP RIGHT AWAY IF:   You have a very bad headache.  You have shortness of breath.  You have chest pain or neck pain.  You have an unusual rash.  You cannot stop throwing up.  You have watery poop that does not stop.  You cannot keep fluids down.  You or your child has a temperature by mouth above 102 F (38.9 C), not controlled by medicine.  Your baby is older than 3 months with a rectal temperature of 102 F (38.9 C) or higher.  Your baby is 20 months old or younger with a rectal temperature of 100.4 F (38 C) or higher. MAKE SURE YOU:   Understand these  instructions.  Will watch this condition.  Will get help right away if you are not doing well or get worse. Document Released: 05/14/2008 Document Revised: 08/24/2011 Document Reviewed: 10/07/2010 [facility] Patient Information 2015 [place], [place]. This information is not intended to replace advice given to you by your health care provider. Make sure you discuss any questions you have with your health care provider.  Sore Throat A sore throat is a painful, burning, sore, or scratchy feeling of the throat. There may be pain or tenderness when swallowing or talking. You may have other symptoms with a sore throat. These include coughing, sneezing, fever, or a swollen neck. A sore throat is often the first sign of another sickness. These sicknesses may include a cold, flu, strep throat, or an infection called mono. Most sore throats go away without medical treatment.  HOME CARE   Only take medicine as told by your doctor.  Drink enough fluids to keep your pee (urine) clear or pale yellow.  Rest as needed.  Try using throat sprays, lozenges, or suck on hard candy (if older than 4 years or as told).  Sip warm liquids, such as broth, herbal tea, or warm water with honey. Try sucking on frozen ice pops or drinking cold liquids.  Rinse the mouth (gargle) with salt water. Mix 1 teaspoon salt with 8 ounces of water.  Do not smoke. Avoid being around others when they are smoking.  Put a humidifier in your bedroom at  night to moisten the air. You can also turn on a hot shower and sit in the bathroom for 5-10 minutes. Be sure the bathroom door is closed. GET HELP RIGHT AWAY IF:   You have trouble breathing.  You cannot swallow fluids, soft foods, or your spit (saliva).  You have more puffiness (swelling) in the throat.  Your sore throat does not get better in 7 days.  You feel sick to your stomach (nauseous) and throw up (vomit).  You have a fever or lasting symptoms for more than 2-3  days.  You have a fever and your symptoms suddenly get worse. MAKE SURE YOU:   Understand these instructions.  Will watch your condition.  Will get help right away if you are not doing well or get worse. Document Released: 03/10/2008 Document Revised: 02/24/2012 Document Reviewed: 02/07/2012 [facility] Patient Information 2015 [place], [place]. This information is not intended to replace advice given to you by your health care provider. Make sure you discuss any questions you have with your health care provider.  Sinus Headache A sinus headache happens when your sinuses become clogged or puffy (swollen). Sinus headaches can be mild or severe. HOME CARE  Take your medicines (antibiotics) as told. Finish them even if you start to feel better.  Only take medicine as told by your doctor.  Use a nose spray if you feel stuffed up (congested). GET HELP RIGHT AWAY IF:  You have a fever.  You have trouble seeing.  You suddenly have pain in your face or head.  You start to twitch or shake (seizure).  You are confused.  You get headaches more than once a week.  Light or sound bothers you.  You feel sick to your stomach (nauseous) or throw up (vomit).  Your headaches do not get better with treatment. MAKE SURE YOU:  Understand these instructions.  Will watch your condition.  Will get help right away if you are not doing well or get worse. Document Released: 10/01/2010 Document Revised: 08/24/2011 Document Reviewed: 10/01/2010 [facility] Patient Information 2015 [place], [place]. This information is not intended to replace advice given to you by your health care provider. Make sure you discuss any questions you have with your health care provider.  Salt Water Gargle This solution will help make your mouth and throat feel better. HOME CARE INSTRUCTIONS   Mix 1 teaspoon of salt in 8 ounces of warm water.  Gargle with this solution as much or often as you need or as directed. Swish  and gargle gently if you have any sores or wounds in your mouth.  Do not swallow this mixture. Document Released: 03/05/2004 Document Revised: 08/24/2011 Document Reviewed: 07/27/2008 [facility] Patient Information 2015 [place], [place]. This information is not intended to replace advice given to you by your health care provider. Make sure you discuss any questions you have with your health care provider.  Nausea and Vomiting Nausea is a sick feeling that often comes before throwing up (vomiting). Vomiting is a reflex where stomach contents come out of your mouth. Vomiting can cause severe loss of body fluids (dehydration). Children and elderly adults can become dehydrated quickly, especially if they also have diarrhea. Nausea and vomiting are symptoms of a condition or disease. It is important to find the cause of your symptoms. CAUSES   Direct irritation of the stomach lining. This irritation can result from increased acid production (gastroesophageal reflux disease), infection, food poisoning, taking certain medicines (such as nonsteroidal anti-inflammatory drugs), alcohol use, or tobacco  use.  Signals from the brain.These signals could be caused by a headache, heat exposure, an inner ear disturbance, increased pressure in the brain from injury, infection, a tumor, or a concussion, pain, emotional stimulus, or metabolic problems.  An obstruction in the gastrointestinal tract (bowel obstruction).  Illnesses such as diabetes, hepatitis, gallbladder problems, appendicitis, kidney problems, cancer, sepsis, atypical symptoms of a heart attack, or eating disorders.  Medical treatments such as chemotherapy and radiation.  Receiving medicine that makes you sleep (general anesthetic) during surgery. DIAGNOSIS Your caregiver may ask for tests to be done if the problems do not improve after a few days. Tests may also be done if symptoms are severe or if the reason for the nausea and vomiting is not  clear. Tests may include:  Urine tests.  Blood tests.  Stool tests.  Cultures (to look for evidence of infection).  X-rays or other imaging studies. Test results can help your caregiver make decisions about treatment or the need for additional tests. TREATMENT You need to stay well hydrated. Drink frequently but in small amounts.You may wish to drink water, sports drinks, clear broth, or eat frozen ice pops or gelatin dessert to help stay hydrated.When you eat, eating slowly may help prevent nausea.There are also some antinausea medicines that may help prevent nausea. HOME CARE INSTRUCTIONS   Take all medicine as directed by your caregiver.  If you do not have an appetite, do not force yourself to eat. However, you must continue to drink fluids.  If you have an appetite, eat a normal diet unless your caregiver tells you differently.  Eat a variety of complex carbohydrates (rice, wheat, potatoes, bread), lean meats, yogurt, fruits, and vegetables.  Avoid high-fat foods because they are more difficult to digest.  Drink enough water and fluids to keep your urine clear or pale yellow.  If you are dehydrated, ask your caregiver for specific rehydration instructions. Signs of dehydration may include:  Severe thirst.  Dry lips and mouth.  Dizziness.  Dark urine.  Decreasing urine frequency and amount.  Confusion.  Rapid breathing or pulse. SEEK IMMEDIATE MEDICAL CARE IF:   You have blood or brown flecks (like coffee grounds) in your vomit.  You have black or bloody stools.  You have a severe headache or stiff neck.  You are confused.  You have severe abdominal pain.  You have chest pain or trouble breathing.  You do not urinate at least once every 8 hours.  You develop cold or clammy skin.  You continue to vomit for longer than 24 to 48 hours.  You have a fever. MAKE SURE YOU:   Understand these instructions.  Will watch your condition.  Will get help  right away if you are not doing well or get worse. Document Released: 06/01/2005 Document Revised: 08/24/2011 Document Reviewed: 10/29/2010 [facility] Patient Information 2015 [place], [place]. This information is not intended to replace advice given to you by your health care provider. Make sure you discuss any questions you have with your health care provider.  Food Choices to Help Relieve Diarrhea When you have diarrhea, the foods you eat and your eating habits are very important. Choosing the right foods and drinks can help relieve diarrhea. Also, because diarrhea can last up to 7 days, you need to replace lost fluids and electrolytes (such as sodium, potassium, and chloride) in order to help prevent dehydration.  WHAT GENERAL GUIDELINES DO I NEED TO FOLLOW?  Slowly drink 1 cup (8 oz) of fluid for  each episode of diarrhea. If you are getting enough fluid, your urine will be clear or pale yellow.  Eat starchy foods. Some good choices include white rice, white toast, pasta, low-fiber cereal, baked potatoes (without the skin), saltine crackers, and bagels.  Avoid large servings of any cooked vegetables.  Limit fruit to two servings per day. A serving is  cup or 1 small piece.  Choose foods with less than 2 g of fiber per serving.  Limit fats to less than 8 tsp (38 g) per day.  Avoid fried foods.  Eat foods that have probiotics in them. Probiotics can be found in certain dairy products.  Avoid foods and beverages that may increase the speed at which food moves through the stomach and intestines (gastrointestinal tract). Things to avoid include:  High-fiber foods, such as dried fruit, raw fruits and vegetables, nuts, seeds, and whole grain foods.  Spicy foods and high-fat foods.  Foods and beverages sweetened with high-fructose corn syrup, honey, or sugar alcohols such as xylitol, sorbitol, and mannitol. WHAT FOODS ARE RECOMMENDED? Grains White rice. White, [place], or pita breads (fresh  or toasted), including plain rolls, buns, or bagels. White pasta. Saltine, soda, or graham crackers. Pretzels. Low-fiber cereal. Cooked cereals made with water (such as cornmeal, farina, or cream cereals). Plain muffins. Matzo. Melba toast. Zwieback.  Vegetables Potatoes (without the skin). Strained tomato and vegetable juices. Most well-cooked and canned vegetables without seeds. Tender lettuce. Fruits Cooked or canned applesauce, apricots, cherries, fruit cocktail, grapefruit, peaches, pears, or plums. Fresh bananas, apples without skin, cherries, grapes, cantaloupe, grapefruit, peaches, oranges, or plums.  Meat and Other Protein Products Baked or boiled chicken. Eggs. Tofu. Fish. Seafood. Smooth peanut butter. Ground or well-cooked tender beef, ham, veal, lamb, pork, or poultry.  Dairy Plain yogurt, kefir, and unsweetened liquid yogurt. Lactose-free milk, buttermilk, or soy milk. Plain hard cheese. Beverages Sport drinks. Clear broths. Diluted fruit juices (except prune). Regular, caffeine-free sodas such as ginger ale. Water. Decaffeinated teas. Oral rehydration solutions. Sugar-free beverages not sweetened with sugar alcohols. Other Bouillon, broth, or soups made from recommended foods.  The items listed above may not be a complete list of recommended foods or beverages. Contact your dietitian for more options. WHAT FOODS ARE NOT RECOMMENDED? Grains Whole grain, whole wheat, bran, or rye breads, rolls, pastas, crackers, and cereals. Wild or brown rice. Cereals that contain more than 2 g of fiber per serving. Corn tortillas or taco shells. Cooked or dry oatmeal. Granola. Popcorn. Vegetables Raw vegetables. Cabbage, broccoli, Brussels sprouts, artichokes, baked beans, beet greens, corn, kale, legumes, peas, sweet potatoes, and yams. Potato skins. Cooked spinach and cabbage. Fruits Dried fruit, including raisins and dates. Raw fruits. Stewed or dried prunes. Fresh apples with skin, apricots,  mangoes, pears, raspberries, and strawberries.  Meat and Other Protein Products Chunky peanut butter. Nuts and seeds. Beans and lentils. Berniece Salines.  Dairy High-fat cheeses. Milk, chocolate milk, and beverages made with milk, such as milk shakes. Cream. Ice cream. Sweets and Desserts Sweet rolls, doughnuts, and sweet breads. Pancakes and waffles. Fats and Oils Butter. Cream sauces. Margarine. Salad oils. Plain salad dressings. Olives. Avocados.  Beverages Caffeinated beverages (such as coffee, tea, soda, or energy drinks). Alcoholic beverages. Fruit juices with pulp. Prune juice. Soft drinks sweetened with high-fructose corn syrup or sugar alcohols. Other Coconut. Hot sauce. Chili powder. Mayonnaise. Gravy. Cream-based or milk-based soups.  The items listed above may not be a complete list of foods and beverages to avoid. Contact your dietitian  for more information. WHAT SHOULD I DO IF I BECOME DEHYDRATED? Diarrhea can sometimes lead to dehydration. Signs of dehydration include dark urine and dry mouth and skin. If you think you are dehydrated, you should rehydrate with an oral rehydration solution. These solutions can be purchased at pharmacies, retail stores, or online.  Drink -1 cup (120-240 mL) of oral rehydration solution each time you have an episode of diarrhea. If drinking this amount makes your diarrhea worse, try drinking smaller amounts more often. For example, drink 1-3 tsp (5-15 mL) every 5-10 minutes.  A general rule for staying hydrated is to drink 1-2 L of fluid per day. Talk to your health care provider about the specific amount you should be drinking each day. Drink enough fluids to keep your urine clear or pale yellow. Document Released: 08/22/2003 Document Revised: 06/06/2013 Document Reviewed: 04/24/2013 [facility] Patient Information 2015 [place], [place]. This information is not intended to replace advice given to you by your health care provider. Make sure you discuss any  questions you have with your health care provider.  Chest Wall Pain Chest wall pain is pain in or around the bones and muscles of your chest. It may take up to 6 weeks to get better. It may take longer if you must stay physically active in your work and activities.  CAUSES  Chest wall pain may happen on its own. However, it may be caused by:  A viral illness like the flu.  Injury.  Coughing.  Exercise.  Arthritis.  Fibromyalgia.  Shingles. HOME CARE INSTRUCTIONS   Avoid overtiring physical activity. Try not to strain or perform activities that cause pain. This includes any activities using your chest or your abdominal and side muscles, especially if heavy weights are used.  Put ice on the sore area.  Put ice in a plastic bag.  Place a towel between your skin and the bag.  Leave the ice on for 15-20 minutes per hour while awake for the first 2 days.  Only take over-the-counter or prescription medicines for pain, discomfort, or fever as directed by your caregiver. SEEK IMMEDIATE MEDICAL CARE IF:   Your pain increases, or you are very uncomfortable.  You have a fever.  Your chest pain becomes worse.  You have new, unexplained symptoms.  You have nausea or vomiting.  You feel sweaty or lightheaded.  You have a cough with phlegm (sputum), or you cough up blood. MAKE SURE YOU:   Understand these instructions.  Will watch your condition.  Will get help right away if you are not doing well or get worse. Document Released: 06/01/2005 Document Revised: 08/24/2011 Document Reviewed: 01/26/2011 [facility] Patient Information 2015 [place], [place]. This information is not intended to replace advice given to you by your health care provider. Make sure you discuss any questions you have with your health care provider.

## 2014-07-10 NOTE — ED Notes (Signed)
Tolerated PO fluids well.

## 2014-07-11 LAB — CULTURE, GROUP A STREP

## 2014-08-17 ENCOUNTER — Encounter (HOSPITAL_COMMUNITY): Payer: Self-pay | Admitting: Emergency Medicine

## 2014-08-17 ENCOUNTER — Emergency Department (HOSPITAL_COMMUNITY)
Admission: EM | Admit: 2014-08-17 | Discharge: 2014-08-17 | Disposition: A | Discharge status: Home / Self Care | Payer: Medicaid Other | Attending: Emergency Medicine | Admitting: Emergency Medicine

## 2014-08-17 DIAGNOSIS — J45909 Unspecified asthma, uncomplicated: Secondary | ICD-10-CM | POA: Insufficient documentation

## 2014-08-17 DIAGNOSIS — Z79899 Other long term (current) drug therapy: Secondary | ICD-10-CM | POA: Insufficient documentation

## 2014-08-17 DIAGNOSIS — R05 Cough: Secondary | ICD-10-CM | POA: Diagnosis not present

## 2014-08-17 DIAGNOSIS — Z3202 Encounter for pregnancy test, result negative: Secondary | ICD-10-CM | POA: Diagnosis not present

## 2014-08-17 DIAGNOSIS — R110 Nausea: Secondary | ICD-10-CM

## 2014-08-17 DIAGNOSIS — R1912 Hyperactive bowel sounds: Secondary | ICD-10-CM | POA: Insufficient documentation

## 2014-08-17 DIAGNOSIS — R52 Pain, unspecified: Secondary | ICD-10-CM | POA: Diagnosis not present

## 2014-08-17 DIAGNOSIS — R509 Fever, unspecified: Secondary | ICD-10-CM | POA: Insufficient documentation

## 2014-08-17 DIAGNOSIS — R197 Diarrhea, unspecified: Secondary | ICD-10-CM | POA: Insufficient documentation

## 2014-08-17 DIAGNOSIS — Z8679 Personal history of other diseases of the circulatory system: Secondary | ICD-10-CM | POA: Diagnosis not present

## 2014-08-17 DIAGNOSIS — R112 Nausea with vomiting, unspecified: Secondary | ICD-10-CM | POA: Diagnosis not present

## 2014-08-17 LAB — URINALYSIS, ROUTINE W REFLEX MICROSCOPIC
GLUCOSE, UA: NEGATIVE mg/dL
KETONES UR: NEGATIVE mg/dL
Leukocytes, UA: NEGATIVE
Nitrite: NEGATIVE
Protein, ur: 30 mg/dL — AB
Specific Gravity, Urine: 1.031 — ABNORMAL HIGH (ref 1.005–1.030)
Urobilinogen, UA: 2 mg/dL — ABNORMAL HIGH (ref 0.0–1.0)
pH: 6.5 (ref 5.0–8.0)

## 2014-08-17 LAB — URINE MICROSCOPIC-ADD ON

## 2014-08-17 LAB — CBC WITH DIFFERENTIAL/PLATELET
Basophils Absolute: 0 10*3/uL (ref 0.0–0.1)
Basophils Relative: 0 % (ref 0–1)
EOS PCT: 0 % (ref 0–5)
Eosinophils Absolute: 0 10*3/uL (ref 0.0–0.7)
HEMATOCRIT: 42.1 % (ref 36.0–46.0)
Hemoglobin: 13.9 g/dL (ref 12.0–15.0)
Lymphocytes Relative: 27 % (ref 12–46)
Lymphs Abs: 1.5 10*3/uL (ref 0.7–4.0)
MCH: 28.7 pg (ref 26.0–34.0)
MCHC: 33 g/dL (ref 30.0–36.0)
MCV: 87 fL (ref 78.0–100.0)
Monocytes Absolute: 1.1 10*3/uL — ABNORMAL HIGH (ref 0.1–1.0)
Monocytes Relative: 20 % — ABNORMAL HIGH (ref 3–12)
Neutro Abs: 3 10*3/uL (ref 1.7–7.7)
Neutrophils Relative %: 53 % (ref 43–77)
PLATELETS: 205 10*3/uL (ref 150–400)
RBC: 4.84 MIL/uL (ref 3.87–5.11)
RDW: 12.4 % (ref 11.5–15.5)
WBC: 5.6 10*3/uL (ref 4.0–10.5)

## 2014-08-17 LAB — COMPREHENSIVE METABOLIC PANEL
ALT: 14 U/L (ref 0–35)
AST: 32 U/L (ref 0–37)
Albumin: 3.8 g/dL (ref 3.5–5.2)
Alkaline Phosphatase: 61 U/L (ref 39–117)
Anion gap: 7 (ref 5–15)
BUN: 11 mg/dL (ref 6–23)
CHLORIDE: 104 mmol/L (ref 96–112)
CO2: 28 mmol/L (ref 19–32)
Calcium: 8.4 mg/dL (ref 8.4–10.5)
Creatinine, Ser: 0.9 mg/dL (ref 0.50–1.10)
GFR calc Af Amer: >90 mL/min (ref >90)
GFR calc non Af Amer: 81 mL/min — ABNORMAL LOW (ref >90)
Glucose, Bld: 107 mg/dL — ABNORMAL HIGH (ref 70–99)
Potassium: 3 mmol/L — ABNORMAL LOW (ref 3.5–5.1)
Sodium: 139 mmol/L (ref 135–145)
Total Bilirubin: 0.5 mg/dL (ref 0.3–1.2)
Total Protein: 7.5 g/dL (ref 6.0–8.3)

## 2014-08-17 LAB — POC URINE PREG, ED: PREG TEST UR: NEGATIVE

## 2014-08-17 MED ORDER — ONDANSETRON HCL 4 MG/2ML IJ SOLN
4.0000 mg | Freq: Once | INTRAMUSCULAR | Status: AC
  Administered 2014-08-17: 4 mg via INTRAVENOUS
  Filled 2014-08-17: qty 2

## 2014-08-17 MED ORDER — MORPHINE SULFATE 2 MG/ML IJ SOLN
2.0000 mg | Freq: Once | INTRAMUSCULAR | Status: AC
  Administered 2014-08-17: 2 mg via INTRAVENOUS
  Filled 2014-08-17: qty 1

## 2014-08-17 MED ORDER — PROCHLORPERAZINE EDISYLATE 5 MG/ML IJ SOLN
10.0000 mg | Freq: Once | INTRAMUSCULAR | Status: AC
  Administered 2014-08-17: 10 mg via INTRAVENOUS
  Filled 2014-08-17: qty 2

## 2014-08-17 MED ORDER — SODIUM CHLORIDE 0.9 % IV BOLUS (SEPSIS)
500.0000 mL | Freq: Once | INTRAVENOUS | Status: AC
  Administered 2014-08-17: 500 mL via INTRAVENOUS

## 2014-08-17 MED ORDER — SODIUM CHLORIDE 0.9 % IV BOLUS (SEPSIS)
1000.0000 mL | Freq: Once | INTRAVENOUS | Status: AC
  Administered 2014-08-17: 1000 mL via INTRAVENOUS

## 2014-08-17 NOTE — Discharge Instructions (Signed)

## 2014-08-17 NOTE — ED Notes (Signed)
Bed: YT11 Expected date:  Expected time:  Means of arrival:  Comments: Hold for t2

## 2014-08-17 NOTE — ED Notes (Signed)
Pt from home c/o vomiting, fever, and body aches since Wednesday. Pt actively vomiting in triage.

## 2014-08-17 NOTE — ED Notes (Signed)
Pt. Tolerated PO fluid intake, no complaint of N/V.

## 2014-08-17 NOTE — ED Provider Notes (Signed)
CSN: [id number]     Arrival date & time 08/17/14  1537 History   First MD Initiated Contact with Patient 08/17/14 1727     Chief Complaint  Patient presents with  . Fever  . Emesis  . Generalized Body Aches     (Consider location/radiation/quality/duration/timing/severity/associated sxs/prior Treatment) The history is provided by the patient.     Susan Guerrero is a 39 y.o. female who presents for evaluation of 2 days illness with nausea, vomiting, diarrhea, fever, chills and cough. She is using Tylenol for fever. She has a sick contact, at work. She denies weakness, dizziness, back pain or headache. There are no other known modifying factors.   Past Medical History  Diagnosis Date  . Asthma   . Varicose veins    History reviewed. No pertinent past surgical history. Family History  Problem Relation Age of Onset  . Hypertension Father   . Migraines Sister   . Asthma Brother   . Obesity Brother   . Cancer Maternal Aunt     ovarian  . Cancer Maternal Grandmother     breast   History  Substance Use Topics  . Smoking status: Never Smoker   . Smokeless tobacco: Not on file  . Alcohol Use: Yes     Comment: occasionally   OB History    No data available     Review of Systems  All other systems reviewed and are negative.     Allergies  Flagyl  Home Medications   Prior to Admission medications   Medication Sig Start Date End Date Taking? Authorizing Provider  albuterol (VENTOLIN HFA) 108 (90 BASE) MCG/ACT inhaler Inhale 2 puffs into the lungs every 4 (four) hours as needed for shortness of breath.    Yes Historical Provider, MD  ibuprofen (ADVIL,MOTRIN) 200 MG tablet Take 400 mg by mouth every 6 (six) hours as needed for moderate pain.   Yes Historical Provider, MD  diazepam (VALIUM) 5 MG tablet Take 1 tablet (5 mg total) by mouth 2 (two) times daily. Patient not taking: Reported on 07/09/2014 04/04/14   Antonietta Breach, PA-C  HYDROcodone-acetaminophen (NORCO) 5-325  MG per tablet Take 1 tablet by mouth every 6 (six) hours as needed for severe pain. Patient not taking: Reported on 08/17/2014 07/10/14   Patty Sermons Camprubi-Soms, PA-C  meloxicam (MOBIC) 7.5 MG tablet Take 2 tablets (15 mg total) by mouth daily. Patient not taking: Reported on 07/09/2014 04/04/14   Antonietta Breach, PA-C  naproxen (NAPROSYN) 500 MG tablet Take 1 tablet (500 mg total) by mouth 2 (two) times daily as needed for mild pain, moderate pain or headache (TAKE WITH MEALS.). Patient not taking: Reported on 08/17/2014 07/10/14   Patty Sermons Camprubi-Soms, PA-C  potassium chloride SA (K-DUR,KLOR-CON) 20 MEQ tablet Take 1 tablet (20 mEq total) by mouth daily. Patient not taking: Reported on 08/17/2014 07/10/14   Patty Sermons Camprubi-Soms, PA-C  promethazine (PHENERGAN) 25 MG suppository Place 1 suppository (25 mg total) rectally every 6 (six) hours as needed for nausea or vomiting. Patient not taking: Reported on 08/17/2014 07/10/14   Patty Sermons Camprubi-Soms, PA-C  traMADol (ULTRAM) 50 MG tablet Take 1 tablet (50 mg total) by mouth every 6 (six) hours as needed. Patient not taking: Reported on 07/09/2014 04/04/14   Antonietta Breach, PA-C   BP 111/71 mmHg  Pulse 87  Temp(Src) 98.6 F (37 C) (Oral)  Resp 19  Ht 5\' 2"  (1.575 m)  Wt 150 lb (68.04 kg)  BMI 27.43 kg/m2  SpO2  96%  LMP 08/05/2014 Physical Exam  Constitutional: She is oriented to person, place, and time. She appears well-developed and well-nourished.  HENT:  Head: Normocephalic and atraumatic.  Right Ear: External ear normal.  Left Ear: External ear normal.  Eyes: Conjunctivae and EOM are normal. Pupils are equal, round, and reactive to light.  Neck: Normal range of motion and phonation normal. Neck supple.  Cardiovascular: Normal rate, regular rhythm and normal heart sounds.   Pulmonary/Chest: Effort normal and breath sounds normal. She exhibits no bony tenderness.  Abdominal: Soft. She exhibits no distension and no mass.  There is no tenderness. There is no guarding.  Hyperactive bowel sounds  Musculoskeletal: Normal range of motion.  Neurological: She is alert and oriented to person, place, and time. No cranial nerve deficit or sensory deficit. She exhibits normal muscle tone. Coordination normal.  Skin: Skin is warm, dry and intact.  Psychiatric: She has a normal mood and affect. Her behavior is normal. Judgment and thought content normal.  Nursing note and vitals reviewed.   ED Course  Procedures (including critical care time)  Medications  ondansetron (ZOFRAN) injection 4 mg (4 mg Intravenous Given 08/17/14 1738)  sodium chloride 0.9 % bolus 500 mL (0 mLs Intravenous Stopped 08/17/14 2051)  ondansetron (ZOFRAN) injection 4 mg (4 mg Intravenous Given 08/17/14 1924)  sodium chloride 0.9 % bolus 1,000 mL (0 mLs Intravenous Stopped 08/17/14 2114)  prochlorperazine (COMPAZINE) injection 10 mg (10 mg Intravenous Given 08/17/14 2116)  morphine 2 MG/ML injection 2 mg (2 mg Intravenous Given 08/17/14 2117)    Patient Vitals for the past 24 hrs:  BP Temp Temp src Pulse Resp SpO2 Height Weight  08/17/14 2136 111/71 mmHg 98.6 F (37 C) Oral 87 19 96 % - -  08/17/14 2112 (!) 97/49 mmHg 100 F (37.8 C) Oral 88 16 98 % 5\' 2"  (1.575 m) 150 lb (68.04 kg)  08/17/14 1601 118/70 mmHg 98.4 F (36.9 C) Oral 109 20 95 % - -    9:18 PM Reevaluation with update and discussion. After initial assessment and treatment, an updated evaluation reveals she is still uncomfortable with vomiting and a sensation of discomfort in the center of her chest. Additional medication ordered. [place] Review Labs Reviewed  CBC WITH DIFFERENTIAL/PLATELET - Abnormal; Notable for the following:    Monocytes Relative 20 (*)    Monocytes Absolute 1.1 (*)    All other components within normal limits  COMPREHENSIVE METABOLIC PANEL - Abnormal; Notable for the following:    Potassium 3.0 (*)    Glucose, Bld 107 (*)    GFR calc non Af  Amer 81 (*)    All other components within normal limits  URINALYSIS, ROUTINE W REFLEX MICROSCOPIC - Abnormal; Notable for the following:    APPearance CLOUDY (*)    Specific Gravity, Urine 1.031 (*)    Hgb urine dipstick LARGE (*)    Bilirubin Urine SMALL (*)    Protein, ur 30 (*)    Urobilinogen, UA 2.0 (*)    All other components within normal limits  URINE MICROSCOPIC-ADD ON - Abnormal; Notable for the following:    Squamous Epithelial / LPF MANY (*)    Bacteria, UA FEW (*)    All other components within normal limits  POC URINE PREG, ED    Imaging Review No results found.   EKG Interpretation None      MDM   Final diagnoses:  Nausea    Nonspecific, nausea, and  vomiting. Patient improved after treatment in the emergency department. Doubt serious bacterial infection, metabolic instability or impending vascular collapse.  Nursing Notes Reviewed/ Care Coordinated Applicable Imaging Reviewed Interpretation of Laboratory Data incorporated into ED treatment  The patient appears reasonably screened and/or stabilized for discharge and I doubt any other medical condition or other [facility] requiring further screening, evaluation, or treatment in the ED at this time prior to discharge.  Plan: Home Medications- none; Home Treatments- rest; return here if the recommended treatment, does not improve the symptoms; Recommended follow up- PCP prn   Richarda Blade, MD 08/18/14 0030

## 2014-10-20 ENCOUNTER — Emergency Department (HOSPITAL_COMMUNITY): Payer: Managed Care, Other (non HMO)

## 2014-10-20 ENCOUNTER — Encounter (HOSPITAL_COMMUNITY): Payer: Self-pay

## 2014-10-20 ENCOUNTER — Emergency Department (HOSPITAL_COMMUNITY)
Admission: EM | Admit: 2014-10-20 | Discharge: 2014-10-20 | Disposition: A | Discharge status: Home / Self Care | Payer: Managed Care, Other (non HMO) | Attending: Emergency Medicine | Admitting: Emergency Medicine

## 2014-10-20 DIAGNOSIS — S80211A Abrasion, right knee, initial encounter: Secondary | ICD-10-CM | POA: Insufficient documentation

## 2014-10-20 DIAGNOSIS — Z8679 Personal history of other diseases of the circulatory system: Secondary | ICD-10-CM | POA: Insufficient documentation

## 2014-10-20 DIAGNOSIS — S161XXA Strain of muscle, fascia and tendon at neck level, initial encounter: Secondary | ICD-10-CM | POA: Insufficient documentation

## 2014-10-20 DIAGNOSIS — J45909 Unspecified asthma, uncomplicated: Secondary | ICD-10-CM | POA: Insufficient documentation

## 2014-10-20 DIAGNOSIS — Z23 Encounter for immunization: Secondary | ICD-10-CM | POA: Insufficient documentation

## 2014-10-20 DIAGNOSIS — S39012A Strain of muscle, fascia and tendon of lower back, initial encounter: Secondary | ICD-10-CM | POA: Insufficient documentation

## 2014-10-20 DIAGNOSIS — Y999 Unspecified external cause status: Secondary | ICD-10-CM | POA: Insufficient documentation

## 2014-10-20 DIAGNOSIS — S0990XA Unspecified injury of head, initial encounter: Secondary | ICD-10-CM | POA: Insufficient documentation

## 2014-10-20 DIAGNOSIS — Y9389 Activity, other specified: Secondary | ICD-10-CM | POA: Insufficient documentation

## 2014-10-20 DIAGNOSIS — Y92410 Unspecified street and highway as the place of occurrence of the external cause: Secondary | ICD-10-CM | POA: Insufficient documentation
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
## 2014-10-20 MED ORDER — BACITRACIN 500 UNIT/GM EX OINT: 1.0000 "application " | TOPICAL_OINTMENT | Freq: Two times a day (BID) | CUTANEOUS | Status: DC

## 2014-10-20 MED ORDER — DICLOFENAC SODIUM 50 MG PO TBEC: 50.0000 mg | DELAYED_RELEASE_TABLET | Freq: Two times a day (BID) | ORAL | Status: DC

## 2014-10-20 MED ORDER — TETANUS-DIPHTH-ACELL PERTUSSIS 5-2.5-18.5 LF-MCG/0.5 IM SUSP
0.5000 mL | Freq: Once | INTRAMUSCULAR | Status: AC
  Administered 2014-10-20: 0.5 mL via INTRAMUSCULAR
  Filled 2014-10-20: qty 0.5

## 2014-10-20 MED ORDER — CYCLOBENZAPRINE HCL 10 MG PO TABS: 10.0000 mg | ORAL_TABLET | Freq: Two times a day (BID) | ORAL | Status: DC | PRN

## 2014-10-20 MED ORDER — IBUPROFEN 200 MG PO TABS
600.0000 mg | ORAL_TABLET | Freq: Once | ORAL | Status: AC
  Administered 2014-10-20: 600 mg via ORAL
  Filled 2014-10-20: qty 3

## 2014-10-20 MED ORDER — CYCLOBENZAPRINE HCL 10 MG PO TABS
10.0000 mg | ORAL_TABLET | Freq: Once | ORAL | Status: AC
  Administered 2014-10-20: 10 mg via ORAL
  Filled 2014-10-20: qty 1
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
## 2014-10-20 NOTE — Discharge Instructions (Signed)
Take the medication as directed. Do not take the muscle relaxant if driving as it will make you sleepy. Return as needed for worsening symptoms.

## 2014-10-20 NOTE — ED Notes (Signed)
Engineer, structural at the bedside

## 2014-10-20 NOTE — ED Notes (Signed)
No visible marks noted to pt. 's chest or back
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
## 2015-02-22 IMAGING — US US PELVIS COMPLETE
1 series · 13 of 25 positions shown · non-contrast
Comparison: None

CLINICAL DATA: Menometrorrhagia

EXAM:
TRANSABDOMINAL AND TRANSVAGINAL ULTRASOUND OF PELVIS
TECHNIQUE: Both transabdominal and transvaginal ultrasound examinations of the
pelvis were performed. Transabdominal technique was performed for
global imaging of the pelvis including uterus, ovaries, adnexal
regions, and pelvic cul-de-sac. It was necessary to proceed with
endovaginal exam following the transabdominal exam to visualize the
endometrium and ovaries.

[Series 1: us pelvis complete · 0.21mm/px · 13 of 74 slices shown]
[im 1/74]
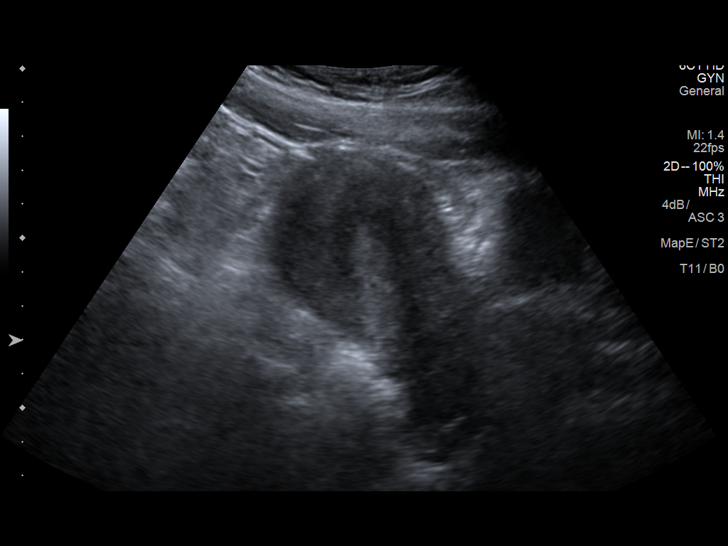
[im 7/74]
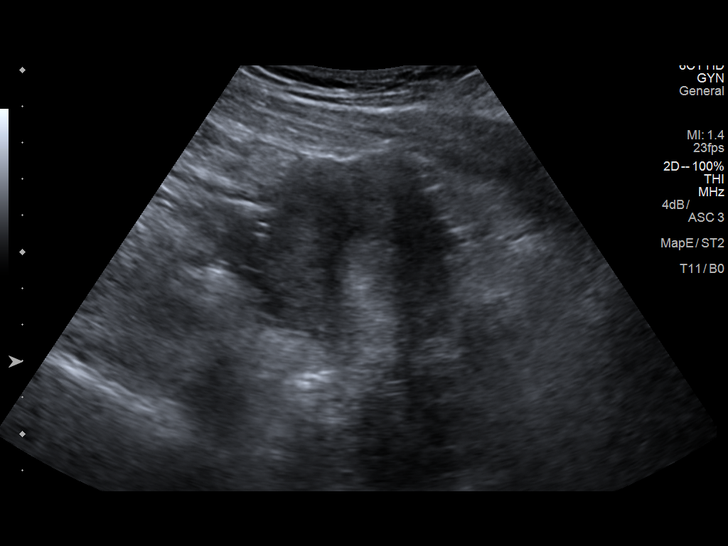
[im 13/74]
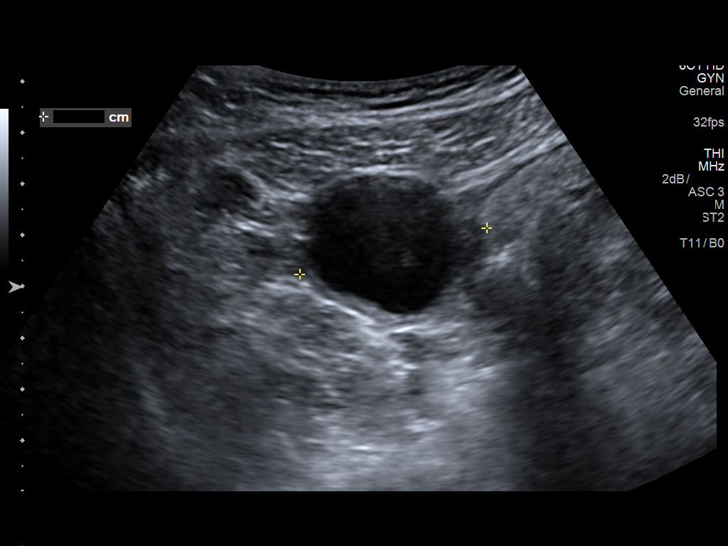
[im 19/74]
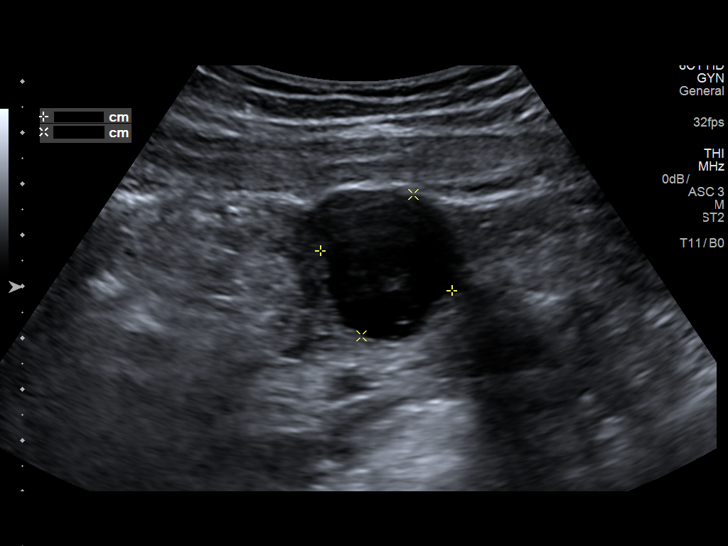
[im 25/74]
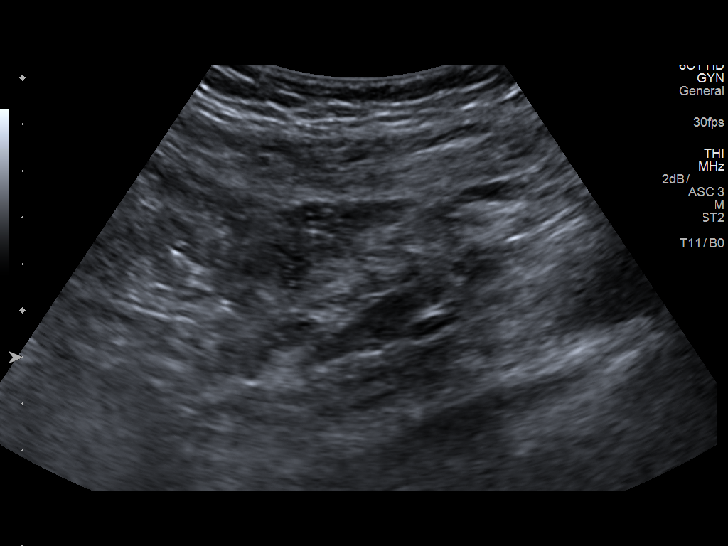
[im 31/74]
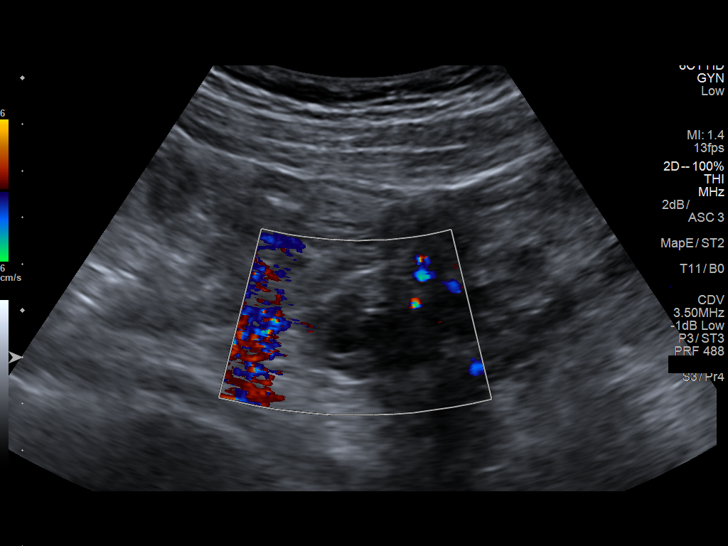
[im 37/74]
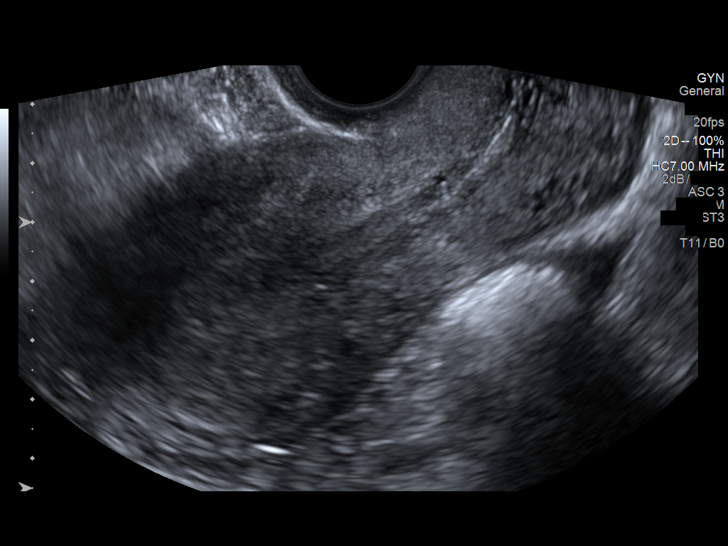
[im 43/74]
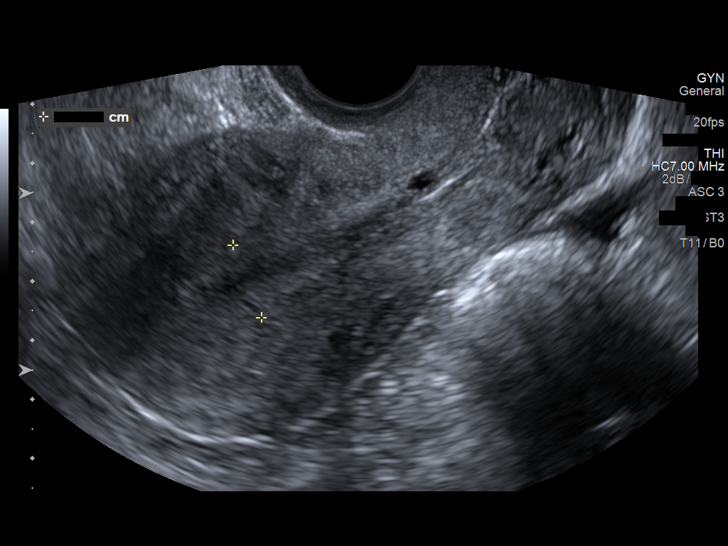
[im 49/74]
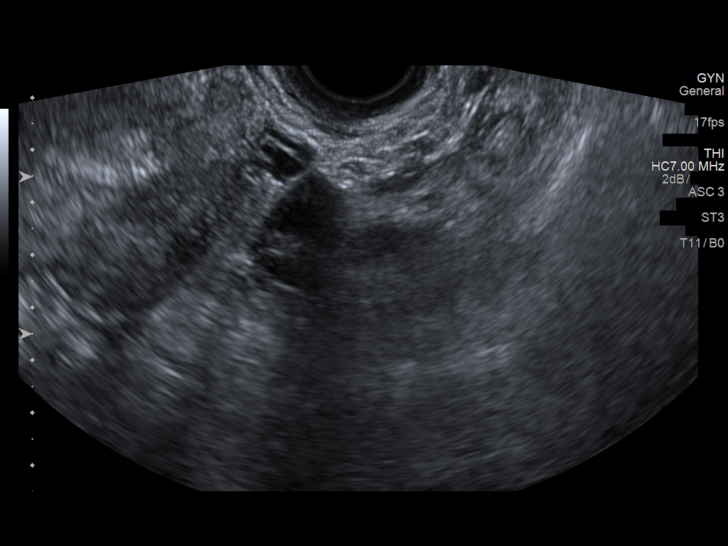
[im 55/74]
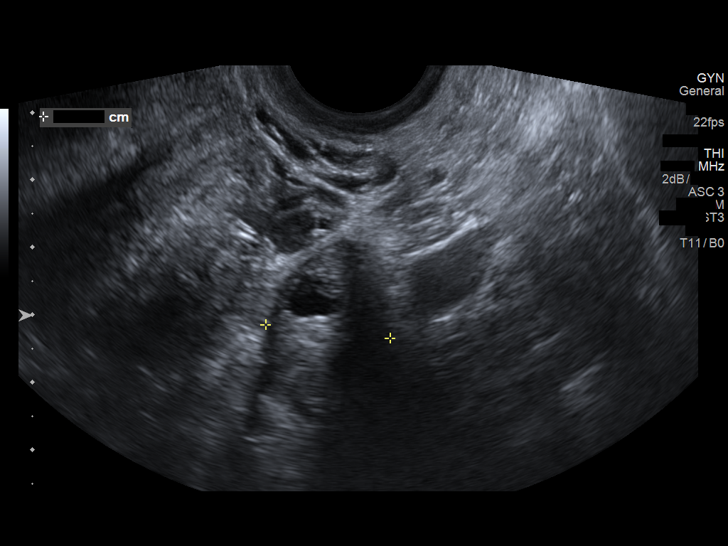
[im 61/74]
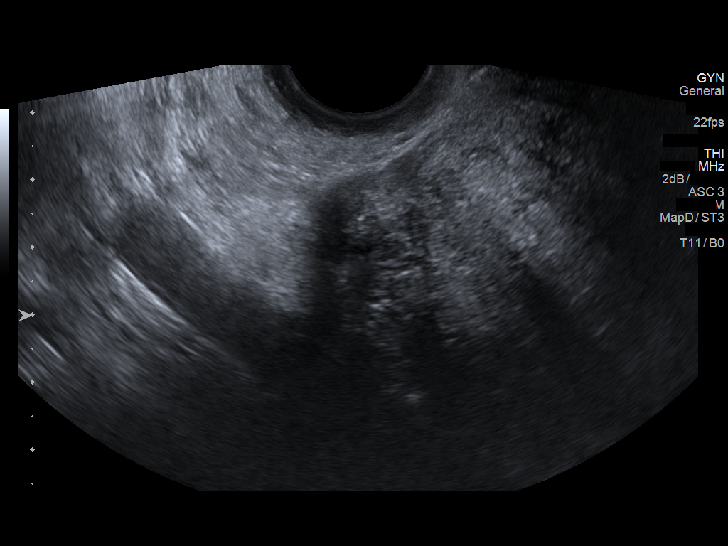
[im 67/74]
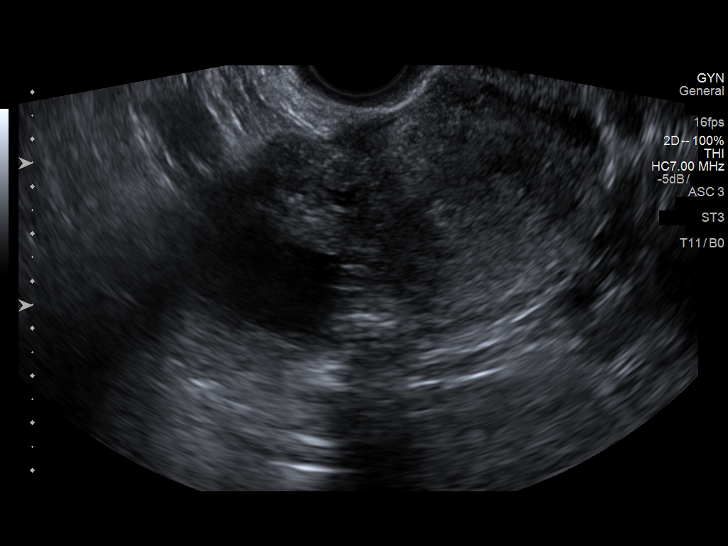
[im 74/74]
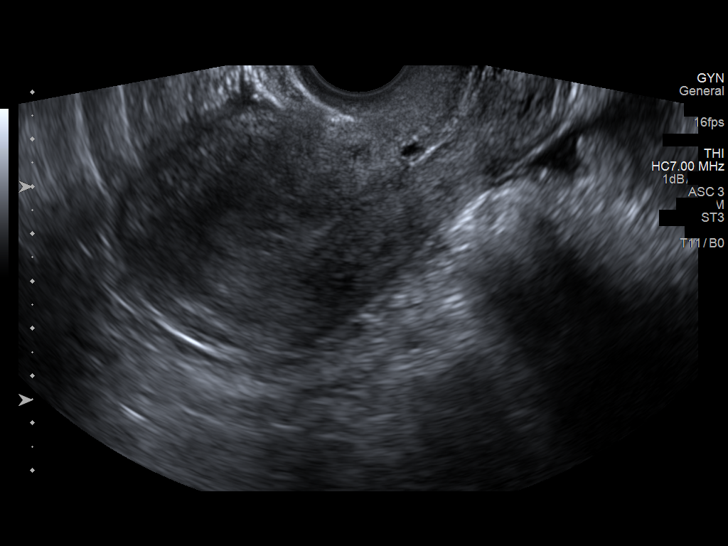

[13 of 25 positions shown; findings below may reference images not displayed]

FINDINGS: Uterus

Measurements: 9.9 x 4.9 x 5.9 cm. No fibroids or other mass
visualized.

Endometrium

Thickness: 13.2 mm.  No focal abnormality visualized.

Right ovary

Measurements: 3.8 x 3.7 x 3.7 cm. 2.5 x 2.4 x 2.9 cm avascular
hypoechoic right ovarian mass with increased through transmission.

Left ovary

Measurements: 3 x 2.5 x 1.9 cm. Normal appearance/no adnexal mass.

Other findings

Small amount pelvic free fluid likely physiologic.
IMPRESSION: 1. 2.5 x 2.4 x 2.9 cm hypoechoic right ovarian mass which may
reflect an endometrioma or hemorrhagic cyst. Short-interval follow
up ultrasound in 6-12 weeks is recommended, preferably during the
week following the patient's normal menses.
2. If bleeding remains unresponsive to hormonal or medical therapy,
sonohysterogram should be considered for focal lesion work-up. (Ref:
Radiological Reasoning: Algorithmic Workup of Abnormal Vaginal
Bleeding with Endovaginal Sonography and Sonohysterography. AJR
[E5]; 191:S68-73)

## 2015-05-10 ENCOUNTER — Encounter (HOSPITAL_COMMUNITY): Payer: Self-pay

## 2015-05-10 ENCOUNTER — Emergency Department (HOSPITAL_COMMUNITY)
Admission: EM | Admit: 2015-05-10 | Discharge: 2015-05-10 | Disposition: A | Discharge status: Home / Self Care | Payer: Medicaid Other | Attending: Emergency Medicine | Admitting: Emergency Medicine

## 2015-05-10 DIAGNOSIS — R1013 Epigastric pain: Secondary | ICD-10-CM

## 2015-05-10 DIAGNOSIS — N390 Urinary tract infection, site not specified: Secondary | ICD-10-CM | POA: Insufficient documentation

## 2015-05-10 DIAGNOSIS — J45909 Unspecified asthma, uncomplicated: Secondary | ICD-10-CM | POA: Insufficient documentation

## 2015-05-10 DIAGNOSIS — R112 Nausea with vomiting, unspecified: Secondary | ICD-10-CM | POA: Diagnosis not present

## 2015-05-10 DIAGNOSIS — Z8679 Personal history of other diseases of the circulatory system: Secondary | ICD-10-CM | POA: Diagnosis not present

## 2015-05-10 DIAGNOSIS — Z791 Long term (current) use of non-steroidal anti-inflammatories (NSAID): Secondary | ICD-10-CM | POA: Diagnosis not present

## 2015-05-10 DIAGNOSIS — Z3202 Encounter for pregnancy test, result negative: Secondary | ICD-10-CM | POA: Insufficient documentation

## 2015-05-10 LAB — COMPREHENSIVE METABOLIC PANEL
ALT: 12 U/L — ABNORMAL LOW (ref 14–54)
ANION GAP: 4 — AB (ref 5–15)
AST: 21 U/L (ref 15–41)
Albumin: 4.2 g/dL (ref 3.5–5.0)
Alkaline Phosphatase: 67 U/L (ref 38–126)
BILIRUBIN TOTAL: 0.6 mg/dL (ref 0.3–1.2)
BUN: 10 mg/dL (ref 6–20)
CALCIUM: 9 mg/dL (ref 8.9–10.3)
CO2: 27 mmol/L (ref 22–32)
Chloride: 106 mmol/L (ref 101–111)
Creatinine, Ser: 0.91 mg/dL (ref 0.44–1.00)
GFR calc non Af Amer: >60 mL/min (ref >60)
Glucose, Bld: 120 mg/dL — ABNORMAL HIGH (ref 65–99)
Potassium: 3.2 mmol/L — ABNORMAL LOW (ref 3.5–5.1)
Sodium: 137 mmol/L (ref 135–145)
TOTAL PROTEIN: 7.8 g/dL (ref 6.5–8.1)

## 2015-05-10 LAB — URINE MICROSCOPIC-ADD ON

## 2015-05-10 LAB — URINALYSIS, ROUTINE W REFLEX MICROSCOPIC
BILIRUBIN URINE: NEGATIVE
Glucose, UA: NEGATIVE mg/dL
Ketones, ur: NEGATIVE mg/dL
Nitrite: NEGATIVE
Protein, ur: NEGATIVE mg/dL
Specific Gravity, Urine: 1.027 (ref 1.005–1.030)
pH: 5.5 (ref 5.0–8.0)

## 2015-05-10 LAB — CBC
HCT: 43.3 % (ref 36.0–46.0)
HEMOGLOBIN: 14.1 g/dL (ref 12.0–15.0)
MCH: 28.4 pg (ref 26.0–34.0)
MCHC: 32.6 g/dL (ref 30.0–36.0)
MCV: 87.1 fL (ref 78.0–100.0)
Platelets: 246 10*3/uL (ref 150–400)
RBC: 4.97 MIL/uL (ref 3.87–5.11)
RDW: 12.8 % (ref 11.5–15.5)
WBC: 6.9 10*3/uL (ref 4.0–10.5)

## 2015-05-10 LAB — LIPASE, BLOOD: Lipase: 43 U/L (ref 11–51)

## 2015-05-10 LAB — POC URINE PREG, ED: Preg Test, Ur: NEGATIVE

## 2015-05-10 MED ORDER — ALUM & MAG HYDROXIDE-SIMETH 200-200-20 MG/5ML PO SUSP
15.0000 mL | Freq: Once | ORAL | Status: AC
  Administered 2015-05-10: 15 mL via ORAL
  Filled 2015-05-10: qty 30

## 2015-05-10 MED ORDER — RANITIDINE HCL 150 MG/10ML PO SYRP
150.0000 mg | ORAL_SOLUTION | Freq: Once | ORAL | Status: AC
  Administered 2015-05-10: 150 mg via ORAL
  Filled 2015-05-10: qty 10

## 2015-05-10 MED ORDER — LIDOCAINE VISCOUS 2 % MT SOLN
15.0000 mL | Freq: Once | OROMUCOSAL | Status: AC
  Administered 2015-05-10: 15 mL via OROMUCOSAL
  Filled 2015-05-10: qty 15

## 2015-05-10 MED ORDER — FOSFOMYCIN TROMETHAMINE 3 G PO PACK
3.0000 g | PACK | Freq: Once | ORAL | Status: AC
  Administered 2015-05-10: 3 g via ORAL
  Filled 2015-05-10: qty 3

## 2015-05-10 MED ORDER — PANTOPRAZOLE SODIUM 20 MG PO TBEC: 20.0000 mg | DELAYED_RELEASE_TABLET | Freq: Every day | ORAL | Status: DC

## 2015-05-10 MED ORDER — RANITIDINE HCL 150 MG PO TABS: 150.0000 mg | ORAL_TABLET | Freq: Two times a day (BID) | ORAL | Status: DC

## 2015-05-10 NOTE — ED Provider Notes (Signed)
CSN: [id number]     Arrival date & time 05/10/15  1816 History   First MD Initiated Contact with Patient 05/10/15 2205     Chief Complaint  Patient presents with  . Abdominal Pain  . Emesis   Patient is a 39 y.o. female presenting with abdominal pain and vomiting. The history is provided by the patient.  Abdominal Pain Pain location:  Epigastric Pain radiates to:  Does not radiate Pain severity:  Moderate Onset quality:  Gradual Duration:  1 day Timing:  Constant Chronicity:  New Relieved by:  Nothing Associated symptoms: nausea and vomiting   Associated symptoms: no chest pain, no constipation, no cough, no diarrhea, no dysuria, no fever, no hematuria, no shortness of breath and no sore throat   Emesis Associated symptoms: abdominal pain   Associated symptoms: no diarrhea, no headaches and no sore throat     Past Medical History  Diagnosis Date  . Asthma   . Varicose veins    History reviewed. No pertinent past surgical history. Family History  Problem Relation Age of Onset  . Hypertension Father   . Migraines Sister   . Asthma Brother   . Obesity Brother   . Cancer Maternal Aunt     ovarian  . Cancer Maternal Grandmother     breast   Social History  Substance Use Topics  . Smoking status: Never Smoker   . Smokeless tobacco: None  . Alcohol Use: Yes     Comment: occasionally   OB History    No data available     Review of Systems  Constitutional: Negative for fever.  HENT: Negative for rhinorrhea and sore throat.   Eyes: Negative for visual disturbance.  Respiratory: Negative for cough, chest tightness and shortness of breath.   Cardiovascular: Negative for chest pain and palpitations.  Gastrointestinal: Positive for nausea, vomiting and abdominal pain. Negative for diarrhea and constipation.  Genitourinary: Negative for dysuria and hematuria.  Musculoskeletal: Negative for back pain and neck pain.  Skin: Negative for rash.  Neurological: Negative for  dizziness and headaches.  Psychiatric/Behavioral: Negative for confusion.  All other systems reviewed and are negative.  Allergies  Flagyl  Home Medications   Prior to Admission medications   Medication Sig Start Date End Date Taking? Authorizing Provider  albuterol (VENTOLIN HFA) 108 (90 BASE) MCG/ACT inhaler Inhale 2 puffs into the lungs every 4 (four) hours as needed for shortness of breath.     Historical Provider, MD  cyclobenzaprine (FLEXERIL) 10 MG tablet Take 1 tablet (10 mg total) by mouth 2 (two) times daily as needed for muscle spasms. 10/20/14   Hope Bunnie Pion, NP  diazepam (VALIUM) 5 MG tablet Take 1 tablet (5 mg total) by mouth 2 (two) times daily. Patient not taking: Reported on 07/09/2014 04/04/14   Antonietta Breach, PA-C  diclofenac (VOLTAREN) 50 MG EC tablet Take 1 tablet (50 mg total) by mouth 2 (two) times daily. 10/20/14   Hope Bunnie Pion, NP  HYDROcodone-acetaminophen (NORCO) 5-325 MG per tablet Take 1 tablet by mouth every 6 (six) hours as needed for severe pain. Patient not taking: Reported on 08/17/2014 07/10/14   Mercedes Camprubi-Soms, PA-C  ibuprofen (ADVIL,MOTRIN) 200 MG tablet Take 400 mg by mouth every 6 (six) hours as needed for moderate pain.    Historical Provider, MD  meloxicam (MOBIC) 7.5 MG tablet Take 2 tablets (15 mg total) by mouth daily. Patient not taking: Reported on 07/09/2014 04/04/14   Antonietta Breach, PA-C  naproxen (NAPROSYN)  500 MG tablet Take 1 tablet (500 mg total) by mouth 2 (two) times daily as needed for mild pain, moderate pain or headache (TAKE WITH MEALS.). Patient not taking: Reported on 08/17/2014 07/10/14   Mercedes Camprubi-Soms, PA-C  pantoprazole (PROTONIX) 20 MG tablet Take 1 tablet (20 mg total) by mouth daily. 05/10/15   Gustavus Bryant, MD  potassium chloride SA (K-DUR,KLOR-CON) 20 MEQ tablet Take 1 tablet (20 mEq total) by mouth daily. Patient not taking: Reported on 08/17/2014 07/10/14   Mercedes Camprubi-Soms, PA-C  promethazine (PHENERGAN) 25 MG  suppository Place 1 suppository (25 mg total) rectally every 6 (six) hours as needed for nausea or vomiting. Patient not taking: Reported on 08/17/2014 07/10/14   Mercedes Camprubi-Soms, PA-C  ranitidine (ZANTAC) 150 MG tablet Take 1 tablet (150 mg total) by mouth 2 (two) times daily. 05/10/15   Gustavus Bryant, MD  traMADol (ULTRAM) 50 MG tablet Take 1 tablet (50 mg total) by mouth every 6 (six) hours as needed. Patient not taking: Reported on 07/09/2014 04/04/14   Antonietta Breach, PA-C   BP 105/63 mmHg  Pulse 70  Temp(Src) 98.8 F (37.1 C) (Oral)  Resp 18  Ht 5\' 2"  (1.575 m)  Wt 63.504 kg  BMI 25.60 kg/m2  SpO2 100%  LMP 04/13/2015 Physical Exam  Constitutional: She is oriented to person, place, and time. She appears well-developed and well-nourished. No distress.  HENT:  Head: Normocephalic and atraumatic.  Mouth/Throat: Oropharynx is clear and moist.  Eyes: EOM are normal. Pupils are equal, round, and reactive to light.  Neck: Neck supple. No JVD present.  Cardiovascular: Normal rate, regular rhythm, normal heart sounds and intact distal pulses.  Exam reveals no gallop.   No murmur heard. Pulmonary/Chest: Effort normal and breath sounds normal. She has no wheezes. She has no rales.  Abdominal: Soft. She exhibits no distension. There is tenderness in the epigastric area. There is no rigidity, no rebound and no guarding.  Musculoskeletal: Normal range of motion. She exhibits no tenderness.  Neurological: She is alert and oriented to person, place, and time. No cranial nerve deficit. She exhibits normal muscle tone.  Skin: Skin is warm and dry. No rash noted.  Psychiatric: Her behavior is normal.    ED Course  Procedures  None   Labs Review Labs Reviewed  COMPREHENSIVE METABOLIC PANEL - Abnormal; Notable for the following:    Potassium 3.2 (*)    Glucose, Bld 120 (*)    ALT 12 (*)    Anion gap 4 (*)    All other components within normal limits  URINALYSIS, ROUTINE W REFLEX  MICROSCOPIC (NOT AT [facility]) - Abnormal; Notable for the following:    APPearance CLOUDY (*)    Hgb urine dipstick SMALL (*)    Leukocytes, UA SMALL (*)    All other components within normal limits  URINE MICROSCOPIC-ADD ON - Abnormal; Notable for the following:    Squamous Epithelial / LPF 6-30 (*)    Bacteria, UA MANY (*)    All other components within normal limits  LIPASE, BLOOD  CBC  POC URINE PREG, ED  I have personally reviewed and evaluated these images and lab results as part of my medical decision-making.  MDM   Final diagnoses:  Epigastric pain  UTI (lower urinary tract infection)   39 yo AAF with a PMH of asthma and vericose veins who presents with epigastric pain and N/V x 2 days. No lower abdominal or GU symptoms. No RUQ TTP on exam. Epigastric mild  TTP. Labs normal. Will give GI cocktail and reassess. UA concerning for UTI. Gave fosfomycin one time dose here. Symptoms improved with GI cocktail. Will d/c with zantac and protonix. Stable for d/c.   Discussed with Dr. Dayna Barker.   Gustavus Bryant, MD 05/11/15 [id number]  Merrily Pew, MD 05/11/15 [phone]

## 2015-05-10 NOTE — ED Notes (Signed)
Pt here for abdominal pain and vomiting for two days.

## 2015-05-10 NOTE — ED Notes (Signed)
Patient left at this time with all belongings. 

## 2015-05-10 NOTE — Discharge Instructions (Signed)
Asymptomatic Bacteriuria, Female Asymptomatic bacteriuria is the presence of a large number of bacteria in your urine without the usual symptoms of burning or frequent urination. The following conditions increase the risk of asymptomatic bacteriuria:  Diabetes mellitus.  Advanced age.  Pregnancy in the first trimester.  Kidney stones.  Kidney transplants.  Leaky kidney tube valve in young children (reflux). Treatment for this condition is not needed in most people and can lead to other problems such as too much yeast and growth of resistant bacteria. However, some people, such as pregnant women, do need treatment to prevent kidney infection. Asymptomatic bacteriuria in pregnancy is also associated with fetal growth restriction, premature labor, and newborn death. HOME CARE INSTRUCTIONS Monitor your condition for any changes. The following actions may help to relieve any discomfort you are feeling:  Drink enough water and fluids to keep your urine clear or pale yellow. Go to the bathroom more often to keep your bladder empty.  Keep the area around your vagina and rectum clean. Wipe yourself from front to back after urinating. SEEK IMMEDIATE MEDICAL CARE IF:  You develop signs of an infection such as:  Burning with urination.  Frequency of voiding.  Back pain.  Fever.  You have blood in the urine.  You develop a fever. MAKE SURE YOU:  Understand these instructions.  Will watch your condition.  Will get help right away if you are not doing well or get worse.   This information is not intended to replace advice given to you by your health care provider. Make sure you discuss any questions you have with your health care provider.   Document Released: 06/01/2005 Document Revised: 06/22/2014 Document Reviewed: 11/21/2012 Elsevier Interactive Patient Education 2016 [employer]  

## 2015-05-10 NOTE — ED Notes (Signed)
MD at bedside. 

## 2015-09-13 ENCOUNTER — Emergency Department (INDEPENDENT_AMBULATORY_CARE_PROVIDER_SITE_OTHER): Payer: Managed Care, Other (non HMO)

## 2015-09-13 ENCOUNTER — Emergency Department (HOSPITAL_COMMUNITY)
Admission: EM | Admit: 2015-09-13 | Discharge: 2015-09-13 | Disposition: A | Discharge status: Home / Self Care | Payer: Medicaid Other | Source: Home / Self Care | Attending: Family Medicine | Admitting: Family Medicine

## 2015-09-13 ENCOUNTER — Encounter (HOSPITAL_COMMUNITY): Payer: Self-pay | Admitting: *Deleted

## 2015-09-13 DIAGNOSIS — J189 Pneumonia, unspecified organism: Secondary | ICD-10-CM | POA: Diagnosis not present

## 2015-09-13 IMAGING — DX DG CHEST 2V
2 series · 2 of 2 positions shown · non-contrast
Comparison: [DATE]

CLINICAL DATA: Cough for 8 days, vomiting, fever, history chronic
asthma

EXAM:
CHEST  2 VIEW

[chest pa]
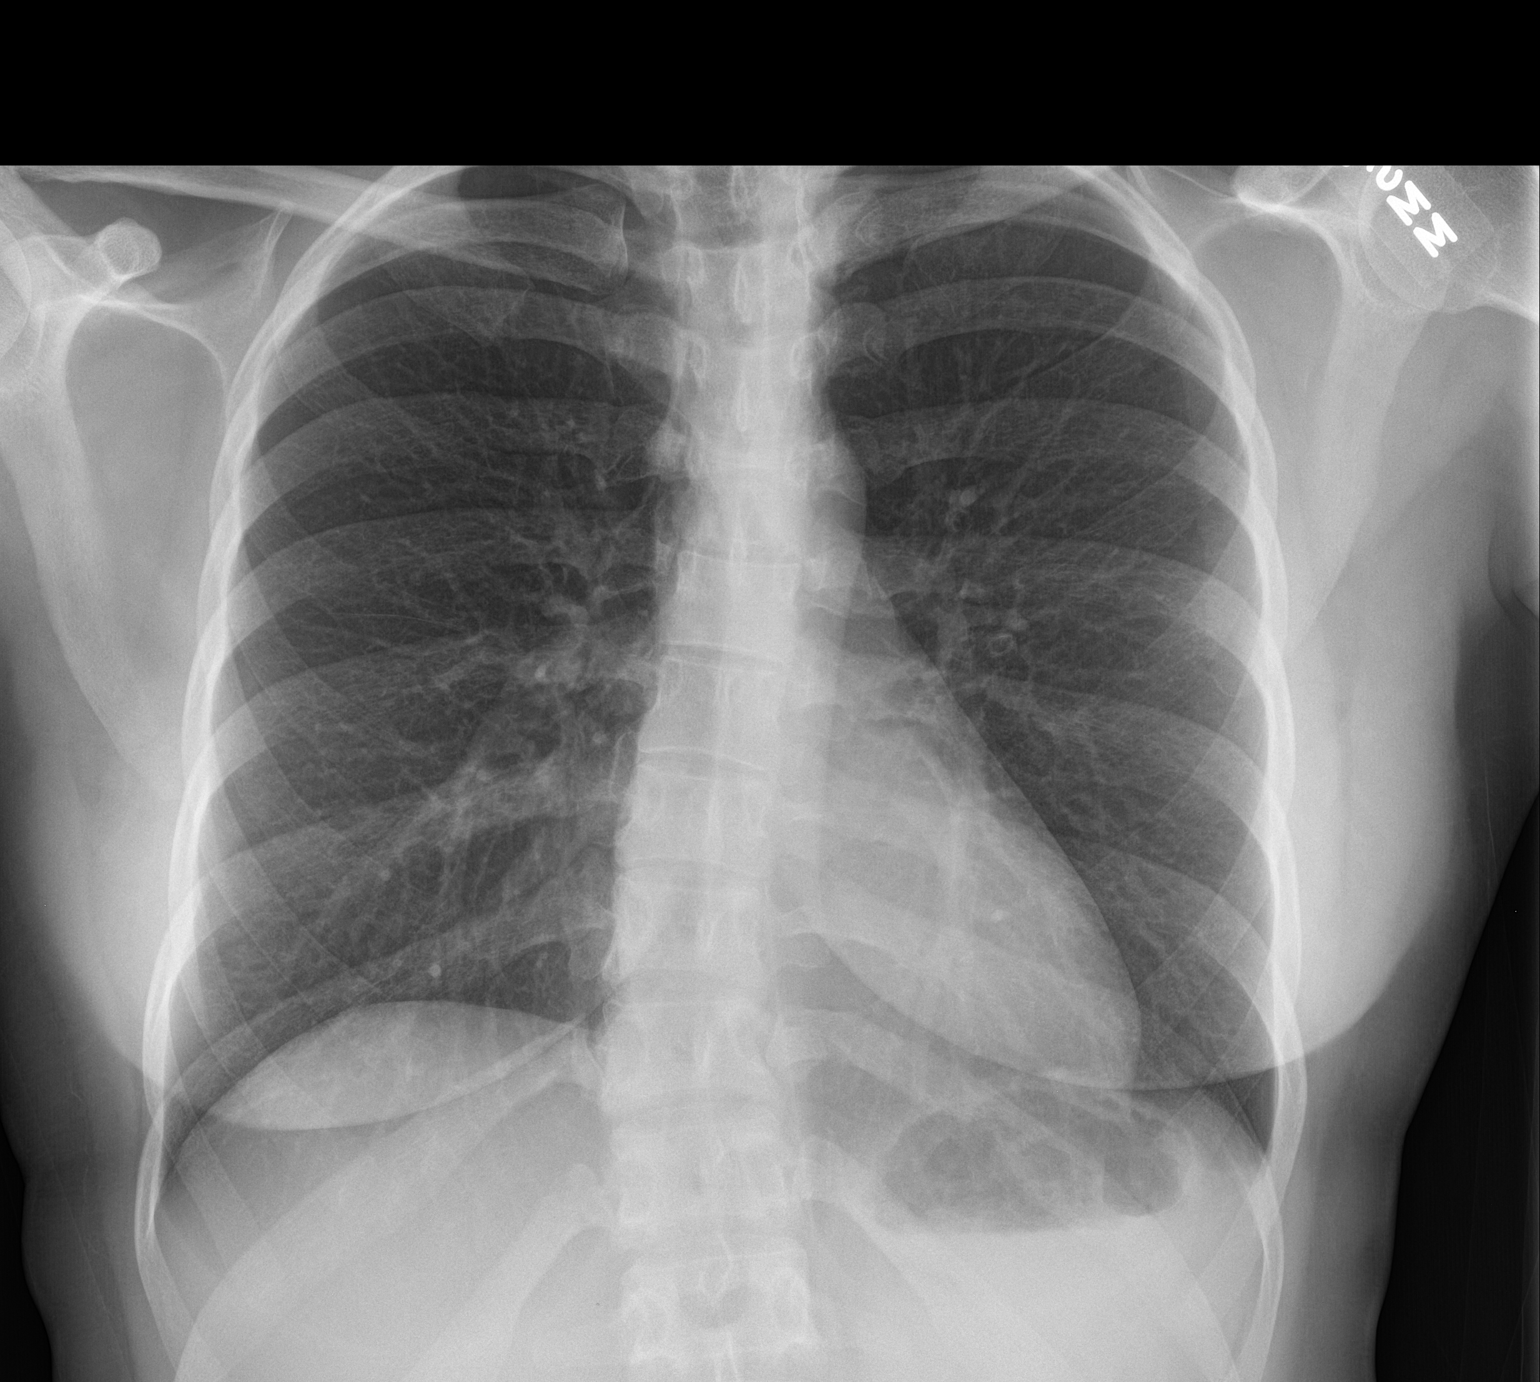

[chest lat]
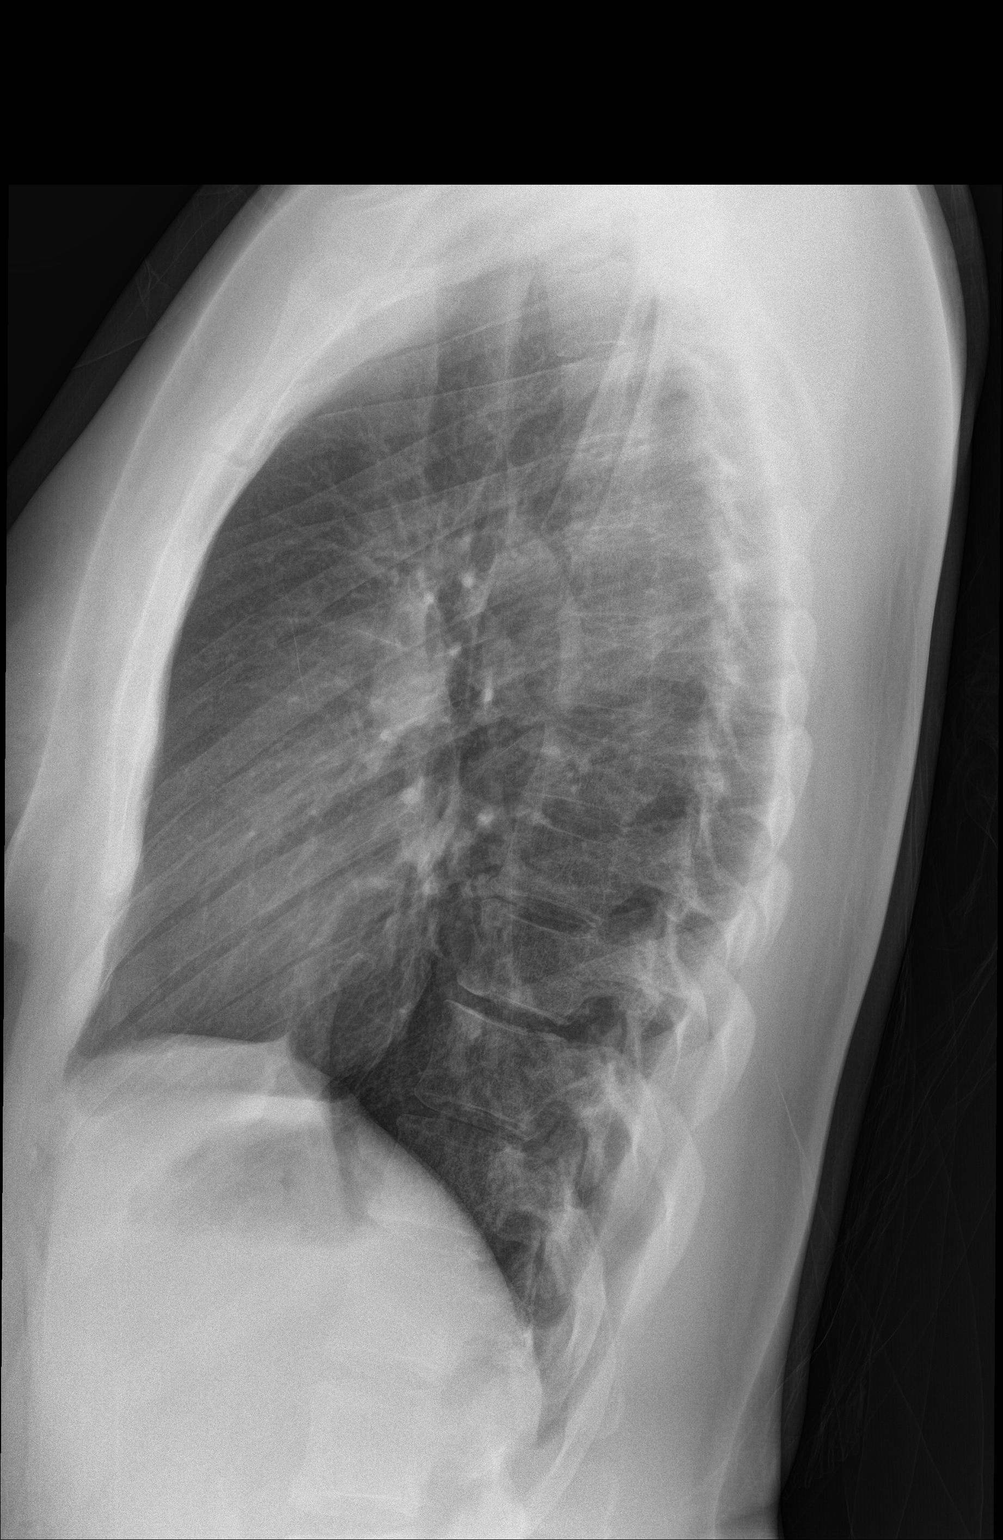

[2 of 2 positions shown; findings below may reference images not displayed]

FINDINGS: Normal heart size, mediastinal contours, and pulmonary vascularity.

Mild central peribronchial thickening, chronic.

Lungs clear.

No pleural effusion or pneumothorax.

Mild biconvex thoracic scoliosis.
IMPRESSION: Chronic peribronchial thickening which could reflect bronchitis or
asthma.

No acute infiltrate.

## 2015-09-13 MED ORDER — FLUCONAZOLE 150 MG PO TABS: 150.0000 mg | ORAL_TABLET | Freq: Every day | ORAL | Status: DC

## 2015-09-13 MED ORDER — CEFTRIAXONE SODIUM 1 G IJ SOLR
1.0000 g | Freq: Once | INTRAMUSCULAR | Status: AC
  Administered 2015-09-13: 1 g via INTRAMUSCULAR

## 2015-09-13 MED ORDER — ONDANSETRON 4 MG PO TBDP: 4.0000 mg | ORAL_TABLET | Freq: Three times a day (TID) | ORAL | Status: DC | PRN

## 2015-09-13 MED ORDER — CEFTRIAXONE SODIUM 1 G IJ SOLR
INTRAMUSCULAR | Status: AC
  Filled 2015-09-13: qty 10

## 2015-09-13 MED ORDER — LEVOFLOXACIN 750 MG PO TABS: 750.0000 mg | ORAL_TABLET | Freq: Every day | ORAL | Status: DC

## 2015-09-13 NOTE — Discharge Instructions (Signed)
You likely have pneumonia. Please take the antiobitic Please start a probiotic Please use the zofran for nausea Please take the diflucan if you devleop a yeast infection

## 2015-09-13 NOTE — ED Provider Notes (Signed)
CSN: [id number]     Arrival date & time 09/13/15  1412 History   None    Chief Complaint  Patient presents with  . Cough   (Consider location/radiation/quality/duration/timing/severity/associated sxs/prior Treatment) HPI   8 days of cough. Now posttussis emesis. Getting worse. Fevers developed over the last 2 days.. Motrin w/ some benefit. Inhalers w/ minimal benefit. Some chest discomfort on the R. cough is productive. Little to no nasal congestion.  Past Medical History  Diagnosis Date  . Asthma   . Varicose veins    History reviewed. No pertinent past surgical history. Family History  Problem Relation Age of Onset  . Hypertension Father   . Migraines Sister   . Asthma Brother   . Obesity Brother   . Cancer Maternal Aunt     ovarian  . Cancer Maternal Grandmother     breast   Social History  Substance Use Topics  . Smoking status: Never Smoker   . Smokeless tobacco: None  . Alcohol Use: Yes     Comment: occasionally   OB History    No data available     Review of Systems Per HPI with all other pertinent systems negative.   Allergies  Flagyl  Home Medications   Prior to Admission medications   Medication Sig Start Date End Date Taking? Authorizing Provider  albuterol (VENTOLIN HFA) 108 (90 BASE) MCG/ACT inhaler Inhale 2 puffs into the lungs every 4 (four) hours as needed for shortness of breath.     Historical Provider, MD  cyclobenzaprine (FLEXERIL) 10 MG tablet Take 1 tablet (10 mg total) by mouth 2 (two) times daily as needed for muscle spasms. 10/20/14   Hope Bunnie Pion, NP  diazepam (VALIUM) 5 MG tablet Take 1 tablet (5 mg total) by mouth 2 (two) times daily. Patient not taking: Reported on 07/09/2014 04/04/14   Antonietta Breach, PA-C  diclofenac (VOLTAREN) 50 MG EC tablet Take 1 tablet (50 mg total) by mouth 2 (two) times daily. 10/20/14   Morton, NP  fluconazole (DIFLUCAN) 150 MG tablet Take 1 tablet (150 mg total) by mouth daily. Repeat dose in 3 days 09/13/15    Waldemar Dickens, MD  HYDROcodone-acetaminophen [facility]) 5-325 MG per tablet Take 1 tablet by mouth every 6 (six) hours as needed for severe pain. Patient not taking: Reported on 08/17/2014 07/10/14   Mercedes Camprubi-Soms, PA-C  ibuprofen (ADVIL,MOTRIN) 200 MG tablet Take 400 mg by mouth every 6 (six) hours as needed for moderate pain.    Historical Provider, MD  levofloxacin (LEVAQUIN) 750 MG tablet Take 1 tablet (750 mg total) by mouth daily. 09/13/15   Waldemar Dickens, MD  meloxicam (MOBIC) 7.5 MG tablet Take 2 tablets (15 mg total) by mouth daily. Patient not taking: Reported on 07/09/2014 04/04/14   Antonietta Breach, PA-C  naproxen (NAPROSYN) 500 MG tablet Take 1 tablet (500 mg total) by mouth 2 (two) times daily as needed for mild pain, moderate pain or headache (TAKE WITH MEALS.). Patient not taking: Reported on 08/17/2014 07/10/14   Mercedes Camprubi-Soms, PA-C  ondansetron (ZOFRAN-ODT) 4 MG disintegrating tablet Take 1 tablet (4 mg total) by mouth every 8 (eight) hours as needed for nausea or vomiting. 09/13/15   Waldemar Dickens, MD  pantoprazole (PROTONIX) 20 MG tablet Take 1 tablet (20 mg total) by mouth daily. 05/10/15   Gustavus Bryant, MD  potassium chloride SA (K-DUR,KLOR-CON) 20 MEQ tablet Take 1 tablet (20 mEq total) by mouth daily. Patient not taking: Reported  on 08/17/2014 07/10/14   Mercedes Camprubi-Soms, PA-C  promethazine (PHENERGAN) 25 MG suppository Place 1 suppository (25 mg total) rectally every 6 (six) hours as needed for nausea or vomiting. Patient not taking: Reported on 08/17/2014 07/10/14   Mercedes Camprubi-Soms, PA-C  ranitidine (ZANTAC) 150 MG tablet Take 1 tablet (150 mg total) by mouth 2 (two) times daily. 05/10/15   Gustavus Bryant, MD  traMADol (ULTRAM) 50 MG tablet Take 1 tablet (50 mg total) by mouth every 6 (six) hours as needed. Patient not taking: Reported on 07/09/2014 04/04/14   Antonietta Breach, PA-C   Meds Ordered and Administered this Visit   Medications  cefTRIAXone  (ROCEPHIN) injection 1 g (not administered)    BP 142/78 mmHg  Pulse 78  Temp(Src) 98.7 F (37.1 C) (Oral)  Resp 18  SpO2 98%  LMP 09/08/2015 No data found.   Physical Exam Physical Exam  Constitutional: Ill-appearing, well-nourished well-developed  HENT:  Head: Normocephalic and atraumatic.  Eyes: EOMI. PERRL.  Neck: Normal range of motion.  Cardiovascular: RRR, no m/r/g, 2+ distal pulses,  Pulmonary/Chest: Effort normal and breath sounds normal. No respiratory distress.  Abdominal: Soft. Bowel sounds are normal. NonTTP, no distension.  Musculoskeletal: Normal range of motion. Non ttp, no effusion.  Neurological: alert and oriented to person, place, and time.  Skin: Skin is warm. No rash noted. non diaphoretic.  Psychiatric: normal mood and affect. behavior is normal. Judgment and thought content normal.   ED Course  Procedures (including critical care time)  Labs Review Labs Reviewed - No data to display  Imaging Review No results found.   Visual Acuity Review  Right Eye Distance:   Left Eye Distance:   Bilateral Distance:    Right Eye Near:   Left Eye Near:    Bilateral Near:         MDM   1. CAP (community acquired pneumonia)    Chest x-ray without overt sign of pneumonia the patient is clinically dry and suspect this may be masking a pneumonia. She'll be given 1 g of ceftriaxone here in clinic and will be prescribed Levaquin, Diflucan, and Zofran. Patient follow-up with primary care physician or the ED as needed.    Waldemar Dickens, MD 09/13/15 [phone]

## 2015-09-13 NOTE — ED Notes (Signed)
Pt  Reports  A  Non      Productive        Cough  X  1  Week  Associated    With vomiting   With  Onset     X    A  Few    Days          Fever  X  2  Days            Symptoms         X    2  Days

## 2015-09-17 ENCOUNTER — Emergency Department (HOSPITAL_COMMUNITY)
Admission: EM | Admit: 2015-09-17 | Discharge: 2015-09-18 | Disposition: A | Discharge status: Home / Self Care | Payer: Medicaid Other | Attending: Emergency Medicine | Admitting: Emergency Medicine

## 2015-09-17 ENCOUNTER — Encounter (HOSPITAL_COMMUNITY): Payer: Self-pay | Admitting: Emergency Medicine

## 2015-09-17 ENCOUNTER — Emergency Department (HOSPITAL_COMMUNITY): Payer: Medicaid Other

## 2015-09-17 DIAGNOSIS — Z79899 Other long term (current) drug therapy: Secondary | ICD-10-CM | POA: Insufficient documentation

## 2015-09-17 DIAGNOSIS — R509 Fever, unspecified: Secondary | ICD-10-CM | POA: Diagnosis not present

## 2015-09-17 DIAGNOSIS — R059 Cough, unspecified: Secondary | ICD-10-CM

## 2015-09-17 DIAGNOSIS — M791 Myalgia: Secondary | ICD-10-CM | POA: Diagnosis not present

## 2015-09-17 DIAGNOSIS — R000 Tachycardia, unspecified: Secondary | ICD-10-CM | POA: Diagnosis not present

## 2015-09-17 DIAGNOSIS — R0981 Nasal congestion: Secondary | ICD-10-CM | POA: Diagnosis not present

## 2015-09-17 DIAGNOSIS — Z3202 Encounter for pregnancy test, result negative: Secondary | ICD-10-CM | POA: Insufficient documentation

## 2015-09-17 DIAGNOSIS — R197 Diarrhea, unspecified: Secondary | ICD-10-CM | POA: Diagnosis not present

## 2015-09-17 DIAGNOSIS — Z792 Long term (current) use of antibiotics: Secondary | ICD-10-CM | POA: Insufficient documentation

## 2015-09-17 DIAGNOSIS — Z8679 Personal history of other diseases of the circulatory system: Secondary | ICD-10-CM | POA: Insufficient documentation

## 2015-09-17 DIAGNOSIS — R112 Nausea with vomiting, unspecified: Secondary | ICD-10-CM

## 2015-09-17 DIAGNOSIS — R05 Cough: Secondary | ICD-10-CM | POA: Diagnosis not present

## 2015-09-17 DIAGNOSIS — J45909 Unspecified asthma, uncomplicated: Secondary | ICD-10-CM | POA: Diagnosis not present

## 2015-09-17 LAB — URINALYSIS, ROUTINE W REFLEX MICROSCOPIC
GLUCOSE, UA: NEGATIVE mg/dL
Ketones, ur: 15 mg/dL — AB
Leukocytes, UA: NEGATIVE
Nitrite: NEGATIVE
Protein, ur: 30 mg/dL — AB
Specific Gravity, Urine: 1.04 — ABNORMAL HIGH (ref 1.005–1.030)
pH: 6 (ref 5.0–8.0)

## 2015-09-17 LAB — COMPREHENSIVE METABOLIC PANEL
ALT: 15 U/L (ref 14–54)
ANION GAP: 13 (ref 5–15)
AST: 26 U/L (ref 15–41)
Albumin: 4.5 g/dL (ref 3.5–5.0)
Alkaline Phosphatase: 60 U/L (ref 38–126)
BUN: 13 mg/dL (ref 6–20)
CHLORIDE: 108 mmol/L (ref 101–111)
CO2: 23 mmol/L (ref 22–32)
Calcium: 9.1 mg/dL (ref 8.9–10.3)
Creatinine, Ser: 0.88 mg/dL (ref 0.44–1.00)
GFR calc non Af Amer: >60 mL/min (ref >60)
Glucose, Bld: 108 mg/dL — ABNORMAL HIGH (ref 65–99)
Potassium: 3.3 mmol/L — ABNORMAL LOW (ref 3.5–5.1)
SODIUM: 144 mmol/L (ref 135–145)
Total Bilirubin: 1.1 mg/dL (ref 0.3–1.2)
Total Protein: 7.7 g/dL (ref 6.5–8.1)

## 2015-09-17 LAB — CBC
HCT: 40.1 % (ref 36.0–46.0)
HEMOGLOBIN: 13.7 g/dL (ref 12.0–15.0)
MCH: 28.7 pg (ref 26.0–34.0)
MCHC: 34.2 g/dL (ref 30.0–36.0)
MCV: 84.1 fL (ref 78.0–100.0)
Platelets: 285 10*3/uL (ref 150–400)
RBC: 4.77 MIL/uL (ref 3.87–5.11)
RDW: 12.2 % (ref 11.5–15.5)
WBC: 7.4 10*3/uL (ref 4.0–10.5)

## 2015-09-17 LAB — URINE MICROSCOPIC-ADD ON

## 2015-09-17 LAB — I-STAT BETA HCG BLOOD, ED (MC, WL, AP ONLY): I-stat hCG, quantitative: <5 m[IU]/mL (ref <5)

## 2015-09-17 LAB — LIPASE, BLOOD: Lipase: 32 U/L (ref 11–51)

## 2015-09-17 IMAGING — CR DG CHEST 2V
2 series · 2 of 2 positions shown · non-contrast
Comparison: [DATE]

CLINICAL DATA: Chest pain, cough and congestion for 2 weeks.
Febrile for 48 hours.

EXAM:
CHEST  2 VIEW

[w chest pa]
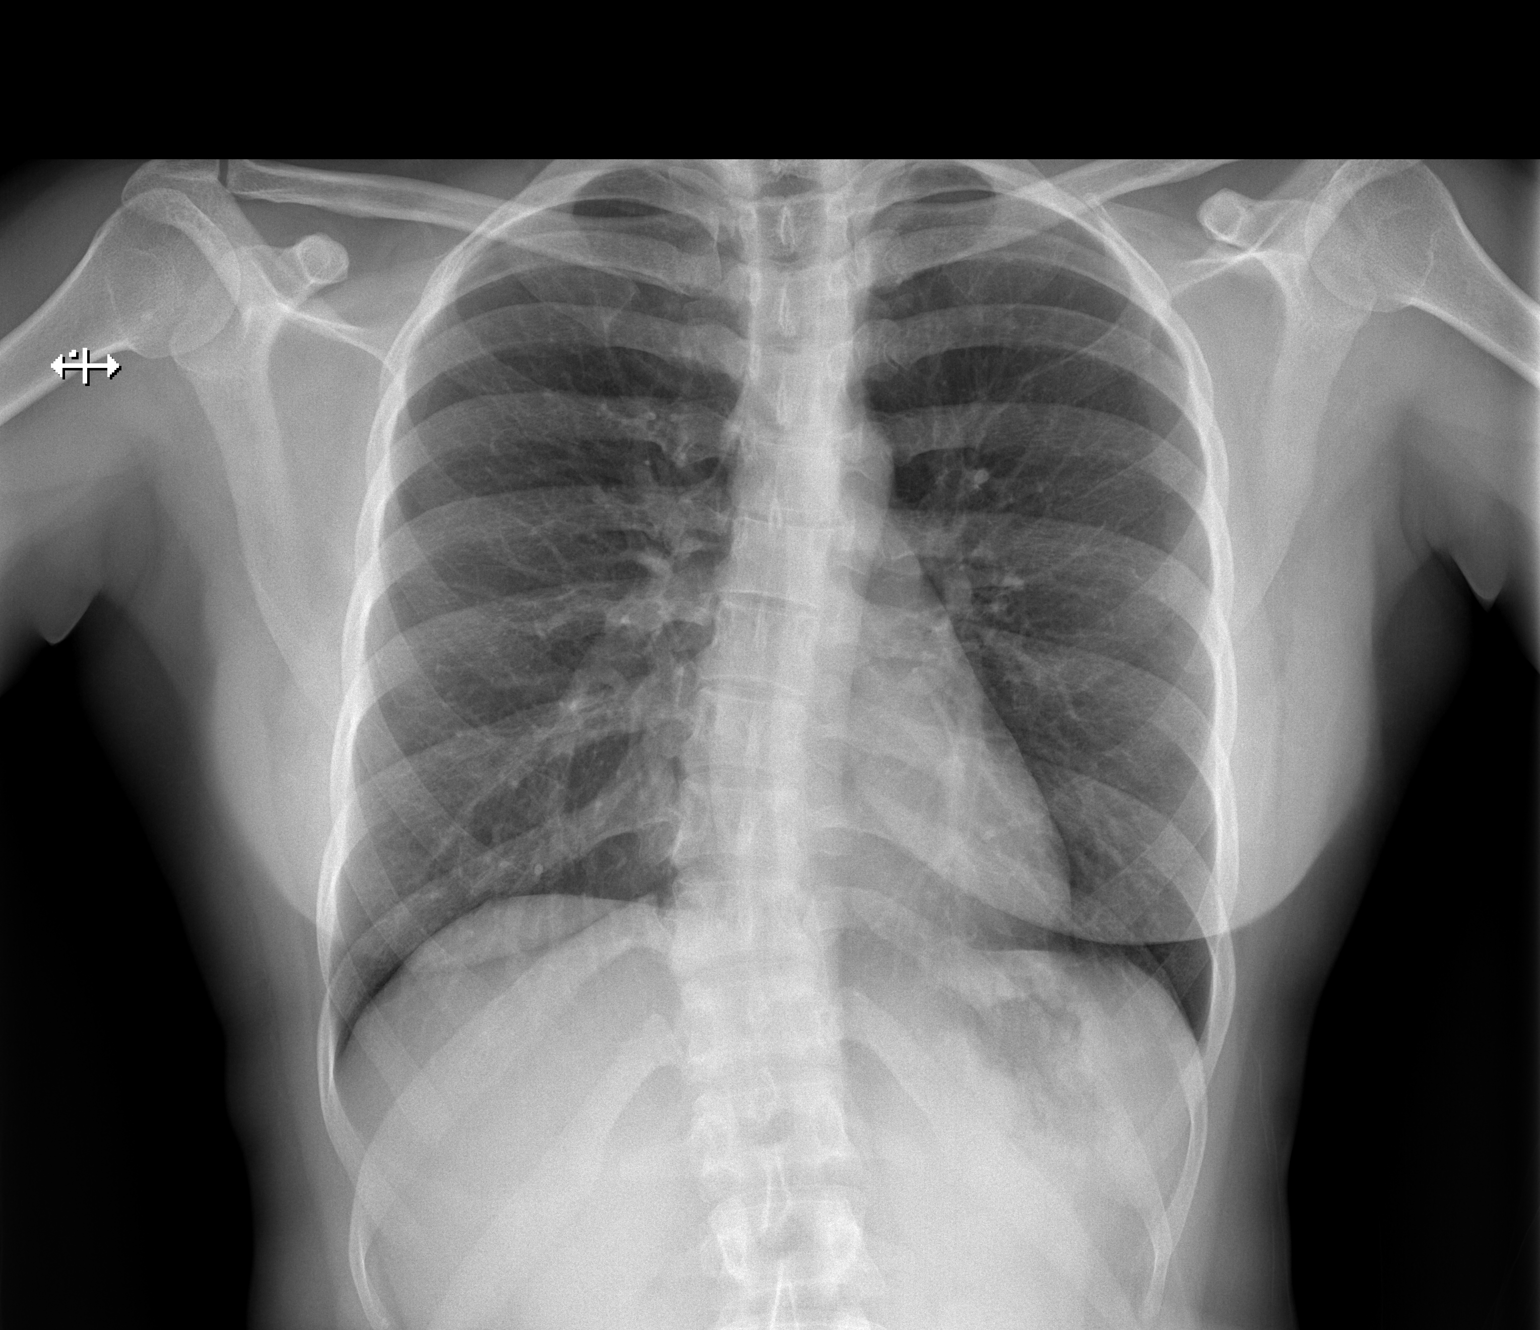

[w chest lat]
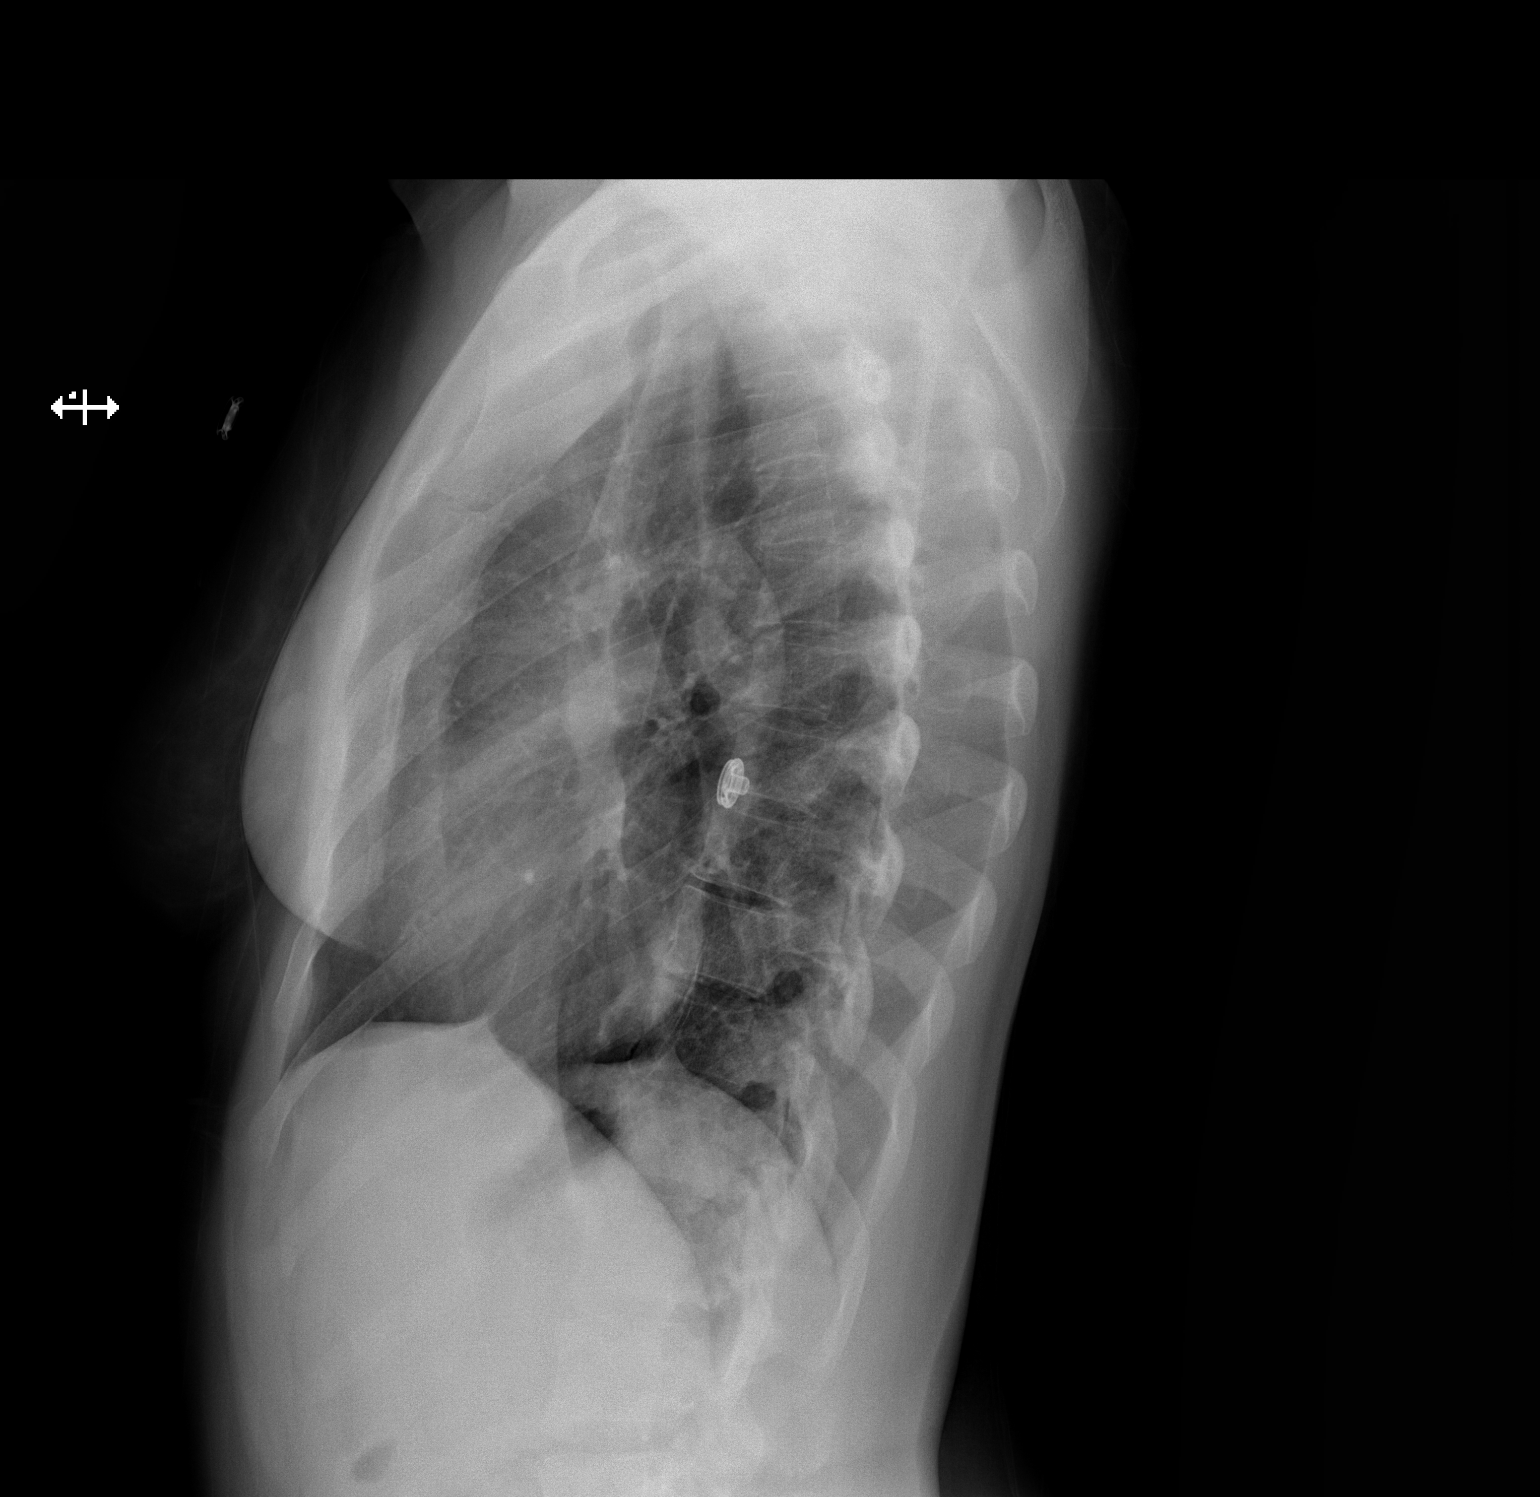

[2 of 2 positions shown; findings below may reference images not displayed]

FINDINGS: The heart size and mediastinal contours are within normal limits.
Both lungs are clear. The visualized skeletal structures are
unremarkable.
IMPRESSION: No active cardiopulmonary disease.

## 2015-09-17 MED ORDER — METOCLOPRAMIDE HCL 10 MG PO TABS: 10.0000 mg | ORAL_TABLET | Freq: Four times a day (QID) | ORAL | Status: DC

## 2015-09-17 MED ORDER — SODIUM CHLORIDE 0.9 % IV BOLUS (SEPSIS): 1000.0000 mL | Freq: Once | INTRAVENOUS | Status: DC

## 2015-09-17 MED ORDER — BENZONATATE 100 MG PO CAPS: 100.0000 mg | ORAL_CAPSULE | Freq: Three times a day (TID) | ORAL | Status: DC

## 2015-09-17 MED ORDER — METOCLOPRAMIDE HCL 5 MG/ML IJ SOLN
10.0000 mg | Freq: Once | INTRAMUSCULAR | Status: AC
  Administered 2015-09-17: 10 mg via INTRAVENOUS
  Filled 2015-09-17: qty 2

## 2015-09-17 MED ORDER — SODIUM CHLORIDE 0.9 % IV BOLUS (SEPSIS)
1000.0000 mL | Freq: Once | INTRAVENOUS | Status: AC
  Administered 2015-09-17: 1000 mL via INTRAVENOUS

## 2015-09-17 MED ORDER — AZITHROMYCIN 250 MG PO TABS: 250.0000 mg | ORAL_TABLET | Freq: Every day | ORAL | Status: DC

## 2015-09-17 MED ORDER — IBUPROFEN 800 MG PO TABS
800.0000 mg | ORAL_TABLET | Freq: Once | ORAL | Status: AC
  Administered 2015-09-17: 800 mg via ORAL
  Filled 2015-09-17: qty 1

## 2015-09-17 NOTE — ED Notes (Signed)
Patient transported to X-ray 

## 2015-09-17 NOTE — ED Provider Notes (Signed)
CSN: [id number]     Arrival date & time 09/17/15  2115 History   First MD Initiated Contact with Patient 09/17/15 2159     Chief Complaint  Patient presents with  . Emesis  . Diarrhea    HPI Comments: 40 year old female who presents with ongoing cough, malaise, and now nausea, vomiting, diarrhea. She was seen at urgent care on the 31st and diagnosed with CAP. Prescribed Levaquin and Zofran. She has been taking her medicines as prescribed along with Motrin however she often vomits after taking her medicines. She reports associated fever of 102 2 days ago, headache, congestion, sore throat, pleuritic chest pain, productive cough, back pain. Denies abdominal pain, dysuria    Patient is a 40 y.o. female presenting with vomiting and diarrhea.  Emesis Associated symptoms: diarrhea and myalgias   Associated symptoms: no abdominal pain and no sore throat   Diarrhea Associated symptoms: fever, myalgias and vomiting   Associated symptoms: no abdominal pain     Past Medical History  Diagnosis Date  . Asthma   . Varicose veins    History reviewed. No pertinent past surgical history. Family History  Problem Relation Age of Onset  . Hypertension Father   . Migraines Sister   . Asthma Brother   . Obesity Brother   . Cancer Maternal Aunt     ovarian  . Cancer Maternal Grandmother     breast   Social History  Substance Use Topics  . Smoking status: Never Smoker   . Smokeless tobacco: None  . Alcohol Use: Yes     Comment: occasionally   OB History    No data available     Review of Systems  Constitutional: Positive for fever.  HENT: Positive for congestion. Negative for ear pain, rhinorrhea and sore throat.   Respiratory: Positive for cough. Negative for shortness of breath.   Cardiovascular: Negative for chest pain.  Gastrointestinal: Positive for nausea, vomiting and diarrhea. Negative for abdominal pain.  Genitourinary: Negative for dysuria.  Musculoskeletal: Positive for  myalgias.   Allergies  Flagyl  Home Medications   Prior to Admission medications   Medication Sig Start Date End Date Taking? Authorizing Provider  albuterol (VENTOLIN HFA) 108 (90 BASE) MCG/ACT inhaler Inhale 2 puffs into the lungs every 4 (four) hours as needed for shortness of breath.    Yes Historical Provider, MD  ibuprofen (ADVIL,MOTRIN) 200 MG tablet Take 400 mg by mouth every 6 (six) hours as needed for moderate pain.   Yes Historical Provider, MD  levofloxacin (LEVAQUIN) 750 MG tablet Take 1 tablet (750 mg total) by mouth daily. 09/13/15  Yes Waldemar Dickens, MD  ondansetron (ZOFRAN-ODT) 4 MG disintegrating tablet Take 1 tablet (4 mg total) by mouth every 8 (eight) hours as needed for nausea or vomiting. 09/13/15  Yes Waldemar Dickens, MD  cyclobenzaprine (FLEXERIL) 10 MG tablet Take 1 tablet (10 mg total) by mouth 2 (two) times daily as needed for muscle spasms. Patient not taking: Reported on 09/17/2015 10/20/14   Ashley Murrain, NP  diazepam (VALIUM) 5 MG tablet Take 1 tablet (5 mg total) by mouth 2 (two) times daily. Patient not taking: Reported on 07/09/2014 04/04/14   Antonietta Breach, PA-C  diclofenac (VOLTAREN) 50 MG EC tablet Take 1 tablet (50 mg total) by mouth 2 (two) times daily. Patient not taking: Reported on 09/17/2015 10/20/14   Ashley Murrain, NP  fluconazole (DIFLUCAN) 150 MG tablet Take 1 tablet (150 mg total) by mouth daily. Repeat  dose in 3 days 09/13/15   Waldemar Dickens, MD  HYDROcodone-acetaminophen [facility]) 5-325 MG per tablet Take 1 tablet by mouth every 6 (six) hours as needed for severe pain. Patient not taking: Reported on 08/17/2014 07/10/14   Mercedes Camprubi-Soms, PA-C  meloxicam (MOBIC) 7.5 MG tablet Take 2 tablets (15 mg total) by mouth daily. Patient not taking: Reported on 07/09/2014 04/04/14   Antonietta Breach, PA-C  naproxen (NAPROSYN) 500 MG tablet Take 1 tablet (500 mg total) by mouth 2 (two) times daily as needed for mild pain, moderate pain or headache (TAKE WITH  MEALS.). Patient not taking: Reported on 08/17/2014 07/10/14   Mercedes Camprubi-Soms, PA-C  pantoprazole (PROTONIX) 20 MG tablet Take 1 tablet (20 mg total) by mouth daily. Patient not taking: Reported on 09/17/2015 05/10/15   Gustavus Bryant, MD  potassium chloride SA (K-DUR,KLOR-CON) 20 MEQ tablet Take 1 tablet (20 mEq total) by mouth daily. Patient not taking: Reported on 08/17/2014 07/10/14   Mercedes Camprubi-Soms, PA-C  promethazine (PHENERGAN) 25 MG suppository Place 1 suppository (25 mg total) rectally every 6 (six) hours as needed for nausea or vomiting. Patient not taking: Reported on 08/17/2014 07/10/14   Mercedes Camprubi-Soms, PA-C  ranitidine (ZANTAC) 150 MG tablet Take 1 tablet (150 mg total) by mouth 2 (two) times daily. Patient not taking: Reported on 09/17/2015 05/10/15   Gustavus Bryant, MD  traMADol (ULTRAM) 50 MG tablet Take 1 tablet (50 mg total) by mouth every 6 (six) hours as needed. Patient not taking: Reported on 07/09/2014 04/04/14   Antonietta Breach, PA-C   BP 114/85 mmHg  Pulse 108  Temp(Src) 98.2 F (36.8 C) (Oral)  Resp 20  SpO2 95%  LMP 09/08/2015   Physical Exam  Constitutional: She is oriented to person, place, and time. She appears well-developed and well-nourished. No distress.  HENT:  Head: Normocephalic and atraumatic.  Right Ear: Hearing, tympanic membrane, external ear and ear canal normal.  Left Ear: Hearing, tympanic membrane, external ear and ear canal normal.  Nose: Mucosal edema and rhinorrhea present.  Mouth/Throat: Uvula is midline, oropharynx is clear and moist and mucous membranes are normal.  Eyes: Conjunctivae are normal. Pupils are equal, round, and reactive to light. Right eye exhibits no discharge. Left eye exhibits no discharge. No scleral icterus.  Neck: Normal range of motion.  Cardiovascular: Regular rhythm, S1 normal and S2 normal.  Tachycardia present.  Exam reveals no gallop and no friction rub.   No murmur heard. Pulmonary/Chest: Effort normal.  No accessory muscle usage. No respiratory distress. She has no decreased breath sounds. She has no wheezes. She has no rhonchi. She has no rales. She exhibits no tenderness.  Abdominal: Soft. There is no tenderness.  Lymphadenopathy:    She has no cervical adenopathy.  Neurological: She is alert and oriented to person, place, and time.  Skin: Skin is warm and dry.  Psychiatric: She has a normal mood and affect.    ED Course  Procedures (including critical care time) Labs Review Labs Reviewed  COMPREHENSIVE METABOLIC PANEL - Abnormal; Notable for the following:    Potassium 3.3 (*)    Glucose, Bld 108 (*)    All other components within normal limits  URINALYSIS, ROUTINE W REFLEX MICROSCOPIC (NOT AT [facility]) - Abnormal; Notable for the following:    Color, Urine AMBER (*)    APPearance CLOUDY (*)    Specific Gravity, Urine 1.040 (*)    Hgb urine dipstick SMALL (*)    Bilirubin Urine SMALL (*)  Ketones, ur 15 (*)    Protein, ur 30 (*)    All other components within normal limits  URINE MICROSCOPIC-ADD ON - Abnormal; Notable for the following:    Squamous Epithelial / LPF 0-5 (*)    Bacteria, UA FEW (*)    All other components within normal limits  LIPASE, BLOOD  CBC  I-STAT BETA HCG BLOOD, ED ([facility], [facility], [facility] ONLY)    Imaging Review Dg Chest 2 View  09/17/2015  CLINICAL DATA:  Chest pain, cough and congestion for 2 weeks. Febrile for 48 hours. EXAM: CHEST  2 VIEW COMPARISON:  09/13/2015 FINDINGS: The heart size and mediastinal contours are within normal limits. Both lungs are clear. The visualized skeletal structures are unremarkable. IMPRESSION: No active cardiopulmonary disease. Electronically Signed   By: Andreas Newport M.D.   On: 09/17/2015 22:26   I have personally reviewed and evaluated these images and lab results as part of my medical decision-making.   EKG Interpretation None      MDM   Final diagnoses:  Nausea vomiting and diarrhea  Cough   40 year old female  who presents with fever, N/V/D. IVF  Reglan given since Zofran was not relieving nausea. CXR was repeated which was normal. Motrin given.  Since she has not been tolerating Levaquin, will switch to Azithromycin. Urine shows dehydration however pt refusing second bag of IVF, verbalizing desire to go home. Pt tolerating PO at this time. Strongly advised to push fluids at home. Given Tessalon for cough and Motrin for fever/pain. Her VS are improving, her pulse is down to 91. Pt is non-toxic, NAD.     Recardo Evangelist, PA-C 09/18/15 0007  Merrily Pew, MD 09/19/15 1020

## 2015-09-17 NOTE — ED Notes (Signed)
Pt states she has had N//V/D x 2 weeks. Dx with PNA at [facility] and given levaquin but states she has been vomiting approx. 45 after she takes it. Alert and oriented.

## 2016-06-17 ENCOUNTER — Emergency Department (HOSPITAL_COMMUNITY): Payer: Medicaid Other

## 2016-06-17 ENCOUNTER — Emergency Department (HOSPITAL_COMMUNITY)
Admission: EM | Admit: 2016-06-17 | Discharge: 2016-06-17 | Disposition: A | Discharge status: Home / Self Care | Payer: Medicaid Other | Attending: Emergency Medicine | Admitting: Emergency Medicine

## 2016-06-17 ENCOUNTER — Encounter (HOSPITAL_COMMUNITY): Payer: Self-pay | Admitting: Emergency Medicine

## 2016-06-17 DIAGNOSIS — M25551 Pain in right hip: Secondary | ICD-10-CM

## 2016-06-17 DIAGNOSIS — M25562 Pain in left knee: Secondary | ICD-10-CM | POA: Diagnosis not present

## 2016-06-17 DIAGNOSIS — Z79899 Other long term (current) drug therapy: Secondary | ICD-10-CM | POA: Insufficient documentation

## 2016-06-17 DIAGNOSIS — J45909 Unspecified asthma, uncomplicated: Secondary | ICD-10-CM | POA: Insufficient documentation

## 2016-06-17 DIAGNOSIS — G8929 Other chronic pain: Secondary | ICD-10-CM | POA: Diagnosis not present

## 2016-06-17 IMAGING — CR DG KNEE COMPLETE 4+V*L*
4 series · 4 of 4 positions shown · non-contrast
Comparison: None.

CLINICAL DATA: Ecchymosis at the posterior aspect of the left knee
tonight.

EXAM:
LEFT KNEE - COMPLETE 4+ VIEW

[t knee ap left]
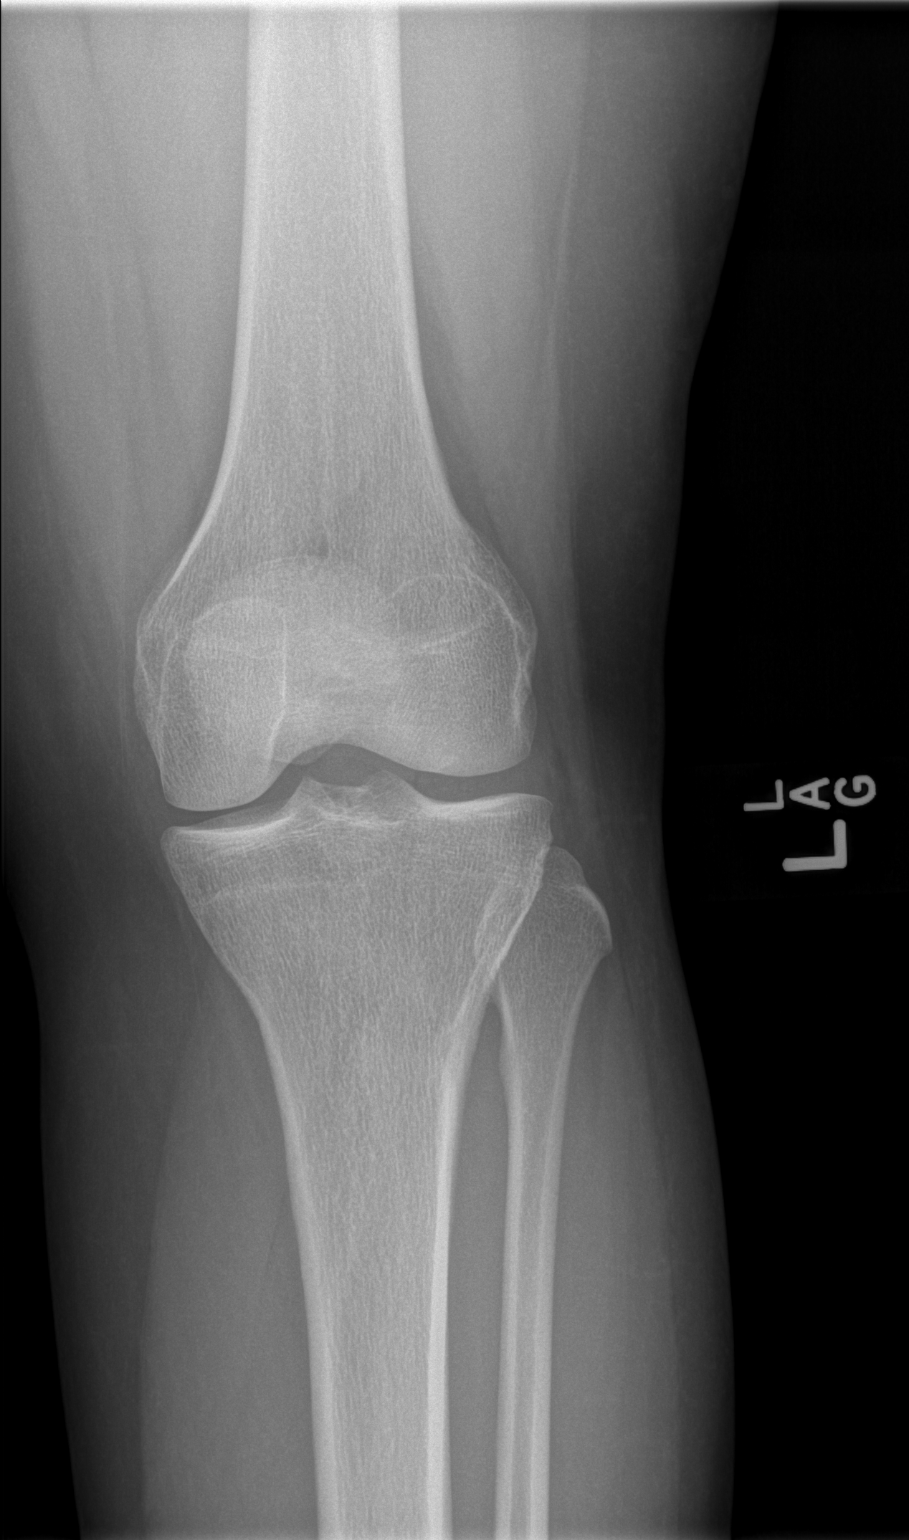

[t knee obl left (1 of 2)]
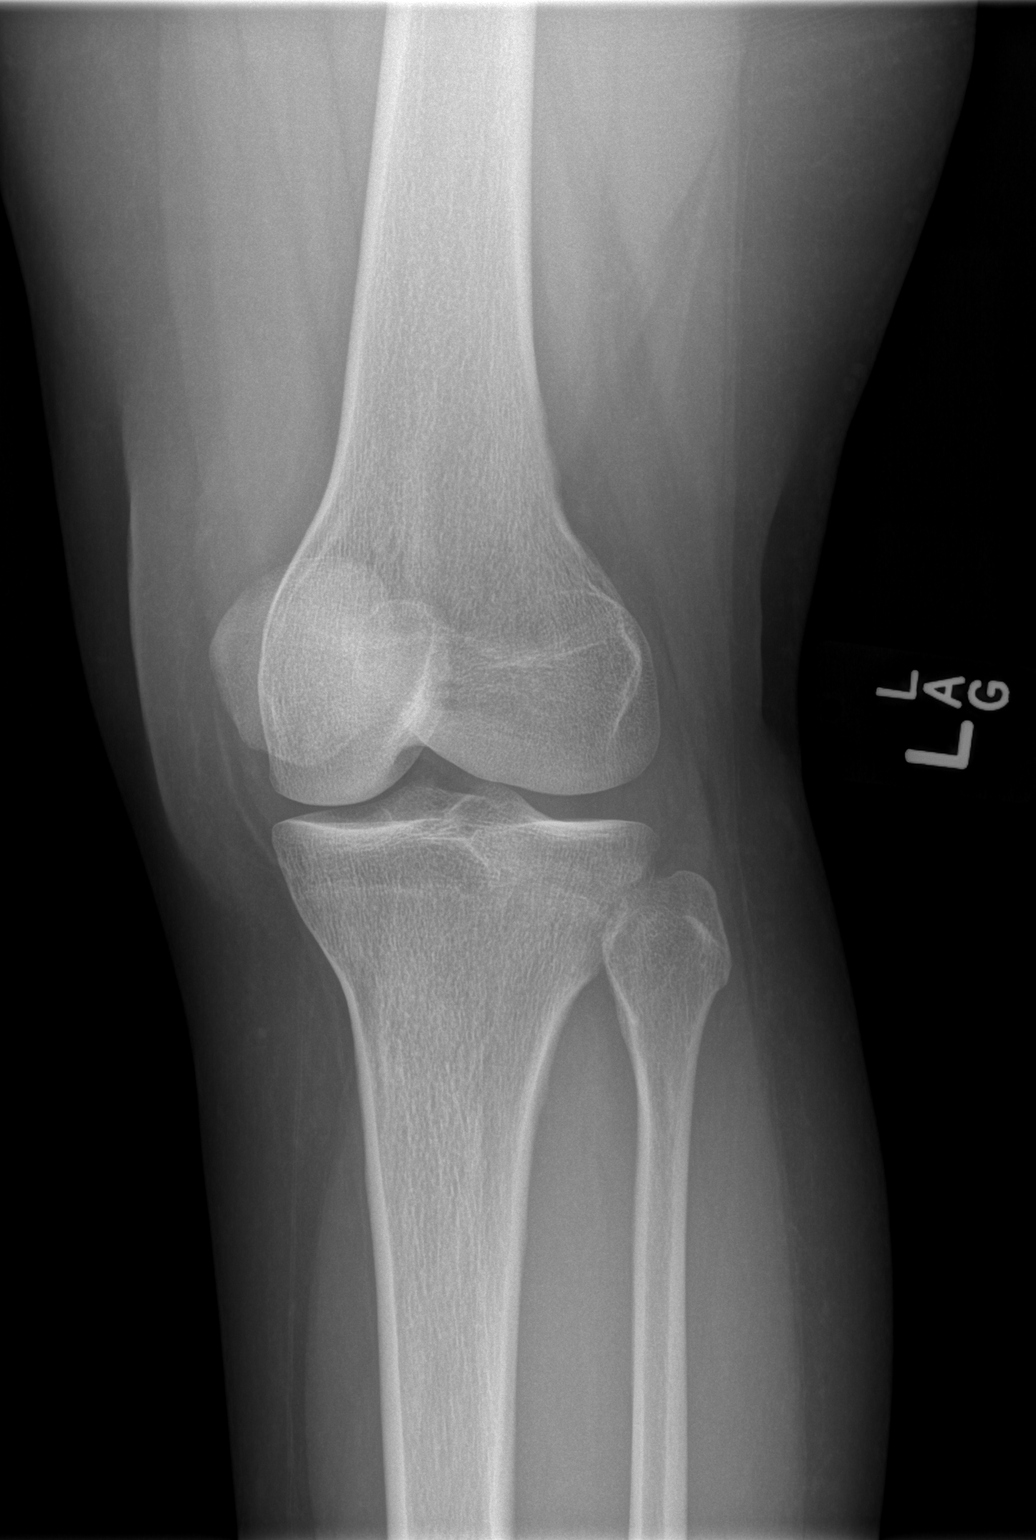

[t knee obl left (2 of 2)]
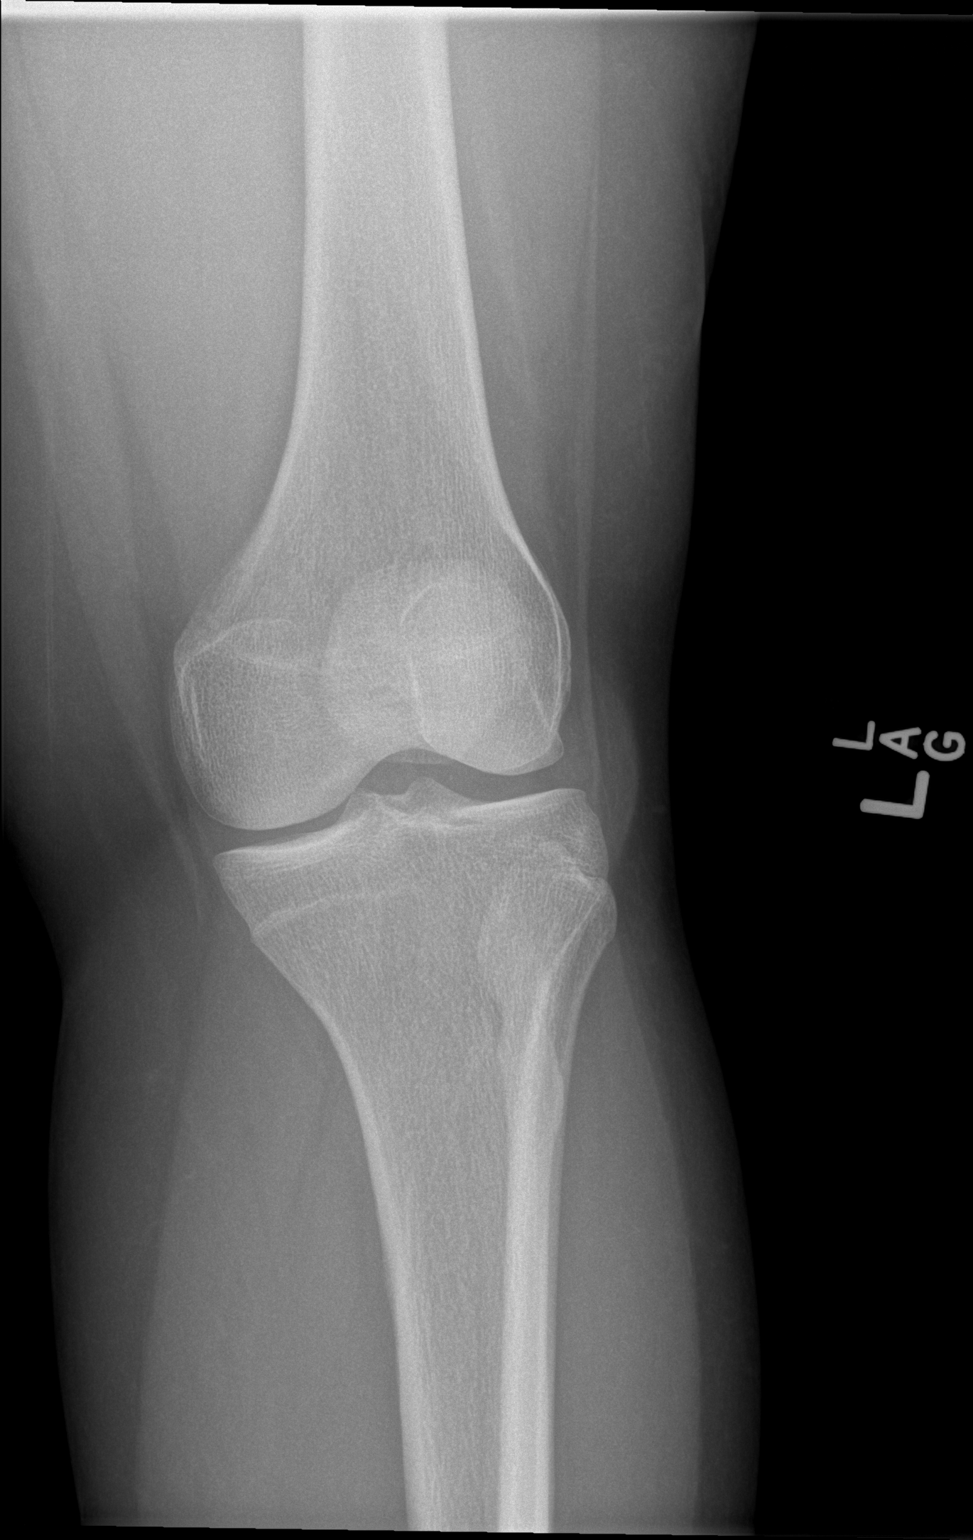

[t knee lat left]
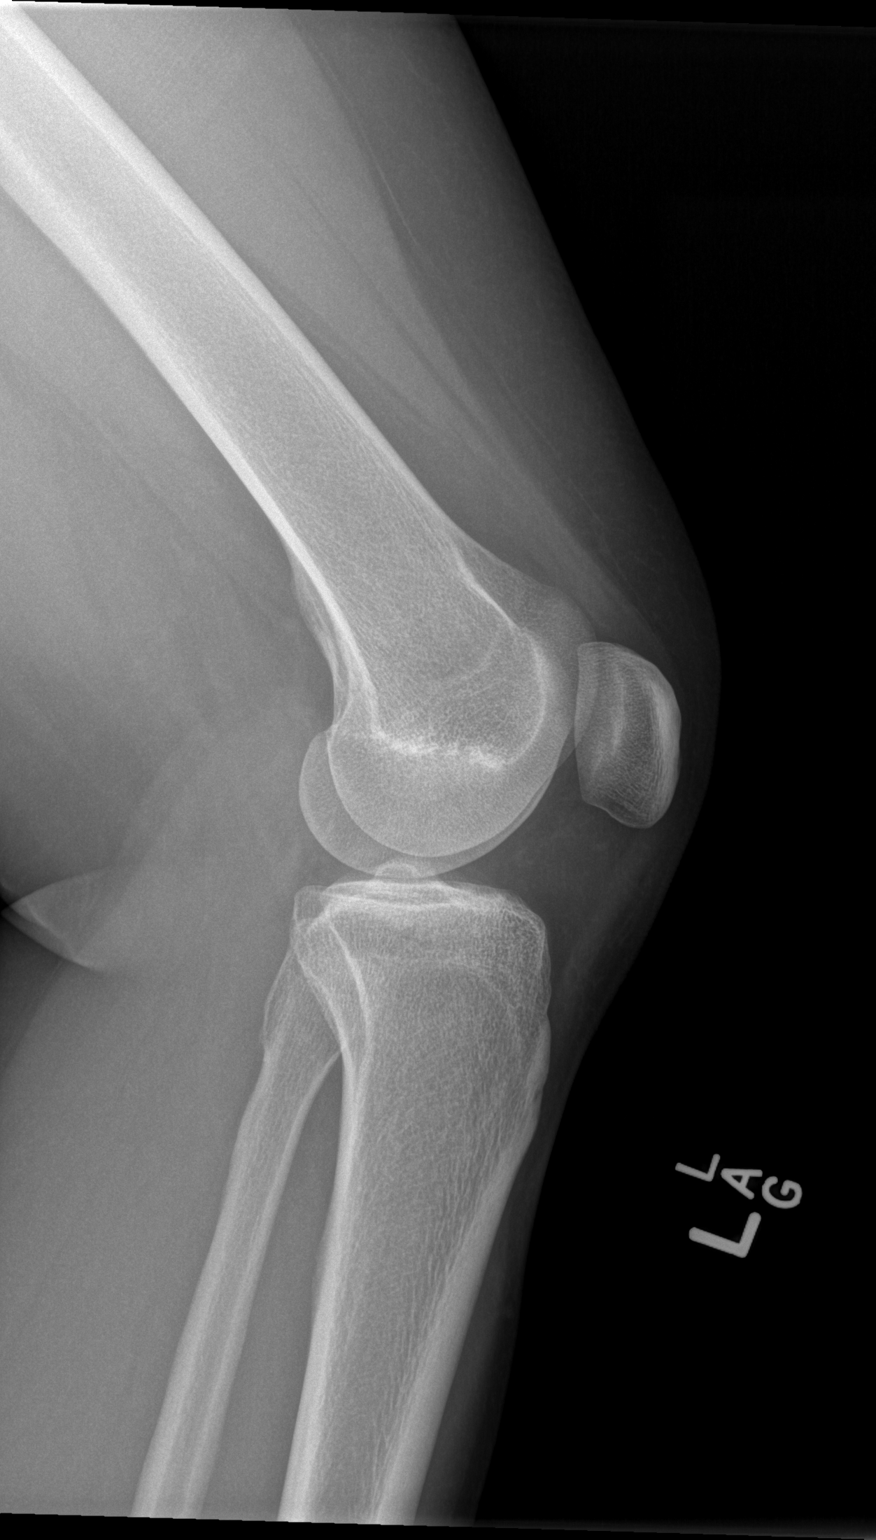

[4 of 4 positions shown; findings below may reference images not displayed]

FINDINGS: No evidence of fracture, dislocation, or joint effusion. No evidence
of arthropathy or other focal bone abnormality. Soft tissues are
unremarkable.
IMPRESSION: Negative.

## 2016-06-17 MED ORDER — IBUPROFEN 800 MG PO TABS: 800.0000 mg | ORAL_TABLET | Freq: Three times a day (TID) | ORAL | 0 refills | Status: DC

## 2016-06-17 MED ORDER — METHOCARBAMOL 500 MG PO TABS: 500.0000 mg | ORAL_TABLET | Freq: Two times a day (BID) | ORAL | 0 refills | Status: DC

## 2016-06-17 NOTE — ED Provider Notes (Signed)
[facility] DEPT Provider Note   CSN: [id number] Arrival date & time: 06/17/16  2121   By signing my name below, I, Susan Guerrero, attest that this documentation has been prepared under the direction and in the presence of [employer], PA-C. Electronically Signed: Eunice Guerrero, Scribe. 06/17/16. 12:16 AM.   History   Chief Complaint Chief Complaint  Patient presents with  . Knee Pain   The history is provided by medical records and the patient. No language interpreter was used.    HPI Comments: Susan Guerrero is a 41 y.o. female who presents to the Emergency Department complaining of intermittent left knee pain x 1 year. States pain has been rapidly progressing x 24 hours. Further notes she scracthed the a bump to the posterior after the shower last night and the skin on the bump opened. She reports worsening, throbbing pain to the left knee ever since. She notes her pain is keeping her awake. States she take prescribed 800 mg ibuprofen every 4-6 hours for her pain at home without relief. She reports she has a knee brace at home, but it hurts more to wear, so she has not used it recently. She states the brace bends with rods at the sides. Also c/o right hip pain x 2-3 days, and per triage, she notes Hx of a pelvic fracture ~2 years ago. She describes the pain as cramping, exacerbated by bending the right leg, and she notes an episode of severe, debilitating pain last night which nearly brought her to tears but has since improved. She states she has used heat pads on the right hip with no relief to her pain. Ambulatory on arrival, per triage.  Past Medical History:  Diagnosis Date  . Asthma   . Varicose veins     There are no active problems to display for this patient.   History reviewed. No pertinent surgical history.  OB History    No data available       Home Medications    Prior to Admission medications   Medication Sig Start Date End Date Taking?  Authorizing Provider  albuterol (VENTOLIN HFA) 108 (90 BASE) MCG/ACT inhaler Inhale 2 puffs into the lungs every 4 (four) hours as needed for shortness of breath.     Historical Provider, MD  azithromycin (ZITHROMAX) 250 MG tablet Take 1 tablet (250 mg total) by mouth daily. Take first 2 tablets together, then 1 every day until finished. 09/17/15   Recardo Evangelist, PA-C  benzonatate (TESSALON) 100 MG capsule Take 1 capsule (100 mg total) by mouth every 8 (eight) hours. 09/17/15   Recardo Evangelist, PA-C  fluconazole (DIFLUCAN) 150 MG tablet Take 1 tablet (150 mg total) by mouth daily. Repeat dose in 3 days 09/13/15   Waldemar Dickens, MD  ibuprofen (ADVIL,MOTRIN) 800 MG tablet Take 1 tablet (800 mg total) by mouth 3 (three) times daily. 06/17/16   Elva Breaker, PA-C  levofloxacin (LEVAQUIN) 750 MG tablet Take 1 tablet (750 mg total) by mouth daily. 09/13/15   Waldemar Dickens, MD  methocarbamol (ROBAXIN) 500 MG tablet Take 1 tablet (500 mg total) by mouth 2 (two) times daily. 06/17/16   Johanna Matto, PA-C  metoCLOPramide (REGLAN) 10 MG tablet Take 1 tablet (10 mg total) by mouth every 6 (six) hours. 09/17/15   Recardo Evangelist, PA-C  ondansetron (ZOFRAN-ODT) 4 MG disintegrating tablet Take 1 tablet (4 mg total) by mouth every 8 (eight) hours as needed for nausea or vomiting. 09/13/15  Waldemar Dickens, MD    Family History Family History  Problem Relation Age of Onset  . Hypertension Father   . Migraines Sister   . Asthma Brother   . Obesity Brother   . Cancer Maternal Aunt     ovarian  . Cancer Maternal Grandmother     breast    Social History Social History  Substance Use Topics  . Smoking status: Never Smoker  . Smokeless tobacco: Never Used  . Alcohol use Yes     Comment: occasionally     Allergies   Flagyl [metronidazole hcl]   Review of Systems Review of Systems  Constitutional: Negative for activity change.  Musculoskeletal: Positive for arthralgias, gait problem  and myalgias. Negative for joint swelling.  Skin: Positive for wound.  Neurological: Negative for weakness.  All other systems reviewed and are negative.    Physical Exam Updated Vital Signs BP 115/77 (BP Location: Left Arm)   Pulse 78   Temp 98 F (36.7 C) (Oral)   Resp 19   Ht 5\' 2"  (1.575 m)   Wt 167 lb 12.8 oz (76.1 kg)   LMP 05/25/2016   SpO2 97%   BMI 30.69 kg/m   Physical Exam  Constitutional: She appears well-developed and well-nourished. No distress.  HENT:  Head: Normocephalic and atraumatic.  Eyes: Conjunctivae are normal.  Neck: Normal range of motion.  Cardiovascular: Normal rate, regular rhythm and intact distal pulses.   Capillary refill < 3 sec  Pulmonary/Chest: Effort normal and breath sounds normal.  Musculoskeletal: She exhibits tenderness. She exhibits no edema.  Right hip: No TTP. Full ROM with flexion and extension. Strength 5/5. Sensation intact to RLE. Left knee: No erytema or joint effusion. No patellar abnormalities. Patellar tend intact. Full ROM with flexion and extension. Strength 5/5. Sensation intact to LLE. Full ROM of the left hip, left ankle and all toes on left foot.  Neurological: She is alert. Coordination normal.  Skin: Skin is warm and dry. She is not diaphoretic.  No tenting of the skin Skin to the posterior knee without lesion, erythema, induration or purulent drainage  Psychiatric: She has a normal mood and affect.  Nursing note and vitals reviewed.    ED Treatments / Results  DIAGNOSTIC STUDIES: Oxygen Saturation is 97% on RA, normal by my interpretation.    COORDINATION OF CARE: 11:08 PM Discussed treatment plan with pt at bedside and pt agreed to plan.   Radiology Dg Knee Complete 4 Views Left  Result Date: 06/17/2016 CLINICAL DATA:  Ecchymosis at the posterior aspect of the left knee tonight. EXAM: LEFT KNEE - COMPLETE 4+ VIEW COMPARISON:  None. FINDINGS: No evidence of fracture, dislocation, or joint effusion. No  evidence of arthropathy or other focal bone abnormality. Soft tissues are unremarkable. IMPRESSION: Negative. Electronically Signed   By: Andreas Newport M.D.   On: 06/17/2016 22:19    Procedures Procedures (including critical care time)  Medications Ordered in ED Medications - No data to display   Initial Impression / Assessment and Plan / ED Course  I have reviewed the triage vital signs and the nursing notes.  Pertinent labs & imaging results that were available during my care of the patient were reviewed by me and considered in my medical decision making (see chart for details).  No abscess noted. XR normal, not suggestive of a fracture or injury to the left knee. Pt advised to continue taking ibuprofen, rest and apply ice to the left knee. Will order muscle  relaxants for right hip pain. Clinical Course     Patient X-Ray negative for obvious fracture or dislocation. Exam reassuring. Pt already taking anti-inflammatories. Pt advised to follow up with orthopedics for further evaluation and pain management. Patient given crutches while in ED, conservative therapy recommended and discussed.  She declines brace reporting that it makes her pain worse.  Patient will be dc home & is agreeable with above plan.   Final Clinical Impressions(s) / ED Diagnoses   Final diagnoses:  Chronic pain of left knee  Right hip pain    New Prescriptions Discharge Medication List as of 06/17/2016 11:37 PM    START taking these medications   Details  methocarbamol (ROBAXIN) 500 MG tablet Take 1 tablet (500 mg total) by mouth 2 (two) times daily., Starting Wed 06/17/2016, Print        I personally performed the services described in this documentation, which was scribed in my presence. The recorded information has been reviewed and is accurate.    Jarrett Soho Litisha Guagliardo, PA-C 06/18/16 0017    Leo Grosser, MD 06/18/16 [phone]

## 2016-06-17 NOTE — ED Triage Notes (Signed)
Patient reports intermittent left knee pain x1 year. Patient reports the pain has been worsening since yesterday. Patient also c/o right hip pain, states she has hx pelvic fracture x2 years ago. Ambulatory to triage.

## 2016-06-17 NOTE — Discharge Instructions (Signed)
1. Medications: alternate naprosyn and tylenol for pain control, robaxin for muscle spasm, usual home medications 2. Treatment: rest, ice, elevate and use brace, drink plenty of fluids, gentle stretching 3. Follow Up: Please followup with orthopedics as directed or your PCP in 1 week if no improvement for discussion of your diagnoses and further evaluation after today's visit; if you do not have a primary care doctor use the resource guide provided to find one; Please return to the ER for worsening symptoms or other concerns

## 2016-06-17 NOTE — ED Notes (Signed)
Patient given crutches and shown how to use them. Patient gave return demonstration showing she knows how to use them.

## 2016-08-21 ENCOUNTER — Emergency Department (HOSPITAL_COMMUNITY): Payer: Medicaid Other

## 2016-08-21 ENCOUNTER — Emergency Department (HOSPITAL_COMMUNITY)
Admission: EM | Admit: 2016-08-21 | Discharge: 2016-08-21 | Disposition: A | Discharge status: Home / Self Care | Payer: Medicaid Other | Attending: Emergency Medicine | Admitting: Emergency Medicine

## 2016-08-21 ENCOUNTER — Encounter (HOSPITAL_COMMUNITY): Payer: Self-pay | Admitting: Nurse Practitioner

## 2016-08-21 DIAGNOSIS — G43109 Migraine with aura, not intractable, without status migrainosus: Secondary | ICD-10-CM

## 2016-08-21 DIAGNOSIS — J45909 Unspecified asthma, uncomplicated: Secondary | ICD-10-CM | POA: Diagnosis not present

## 2016-08-21 DIAGNOSIS — G43809 Other migraine, not intractable, without status migrainosus: Secondary | ICD-10-CM | POA: Insufficient documentation

## 2016-08-21 DIAGNOSIS — Z79899 Other long term (current) drug therapy: Secondary | ICD-10-CM | POA: Insufficient documentation

## 2016-08-21 DIAGNOSIS — R51 Headache: Secondary | ICD-10-CM

## 2016-08-21 DIAGNOSIS — R519 Headache, unspecified: Secondary | ICD-10-CM

## 2016-08-21 IMAGING — CT CT NECK W/ CM
4 of 9 series · 13 of 33 positions shown, 14 images · IV contrast (iopamidol)
Comparison: CT head without contrast from the same day.

CLINICAL DATA: Right-sided neck pain extending to the head. Visual
changes in the right eye.

EXAM:
CT NECK WITH CONTRAST
TECHNIQUE: Multidetector CT imaging of the neck was performed using the
standard protocol following the bolus administration of intravenous
contrast.
CONTRAST:  75mL [EA] IOPAMIDOL ([EA]) INJECTION 61%

[Series 3: neck with st · axial · 0.44mm/px · z∈[+584,+688]mm · 3 of 106 slices shown, 4 images]
[im 27/106  soft-tissue]
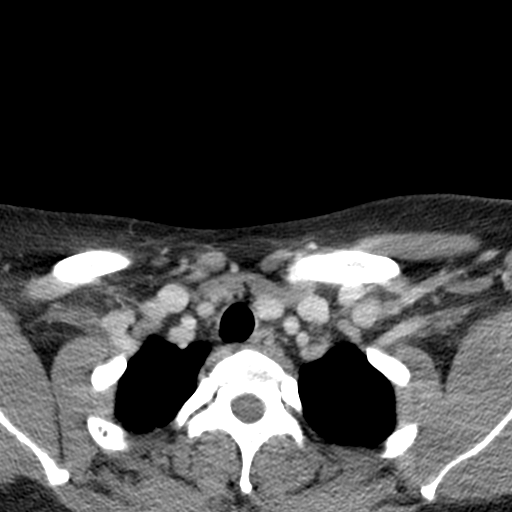
[im 27/106  bone]
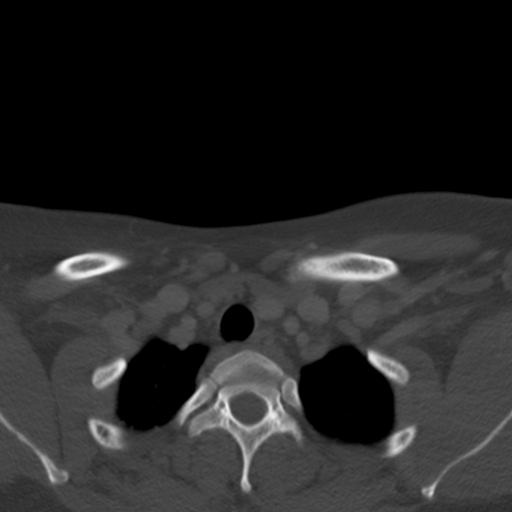
[im 53/106  bone]
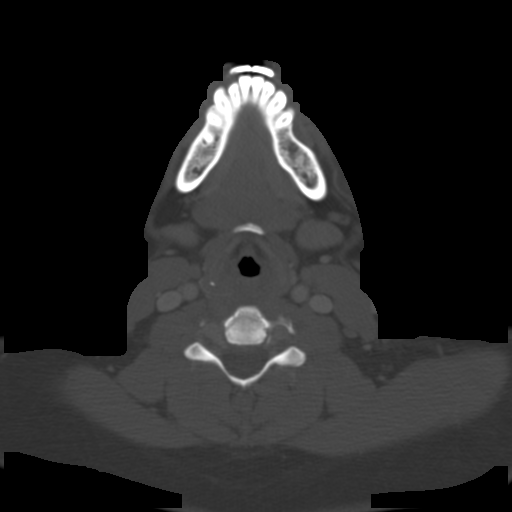
[im 79/106  bone]
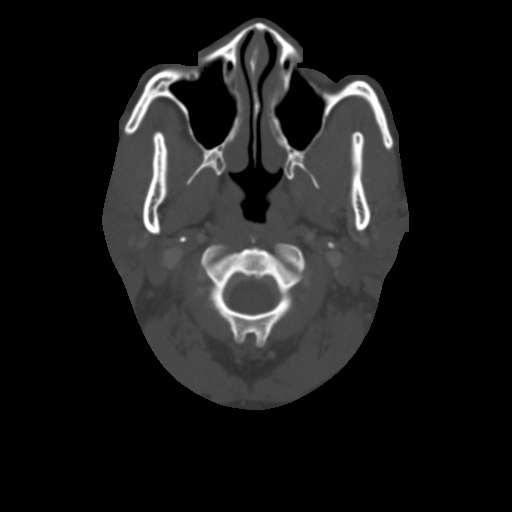

[Series 7: coronal st · coronal · 0.34mm/px · 2 of 120 slices shown]
[im 40/120  bone]
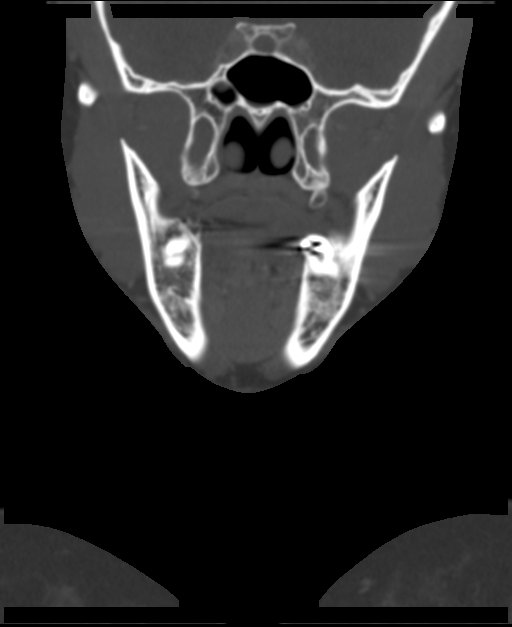
[im 80/120  bone]
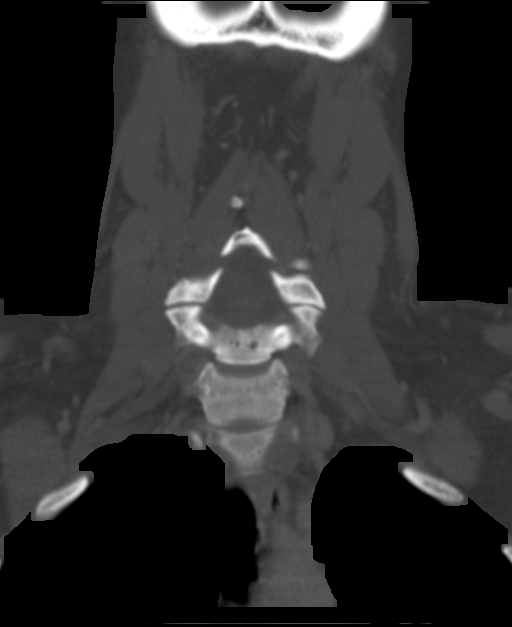

[Series 8: sagittal st · sagittal · 0.39mm/px · 5 of 70 slices shown]
[im 12/70  bone]
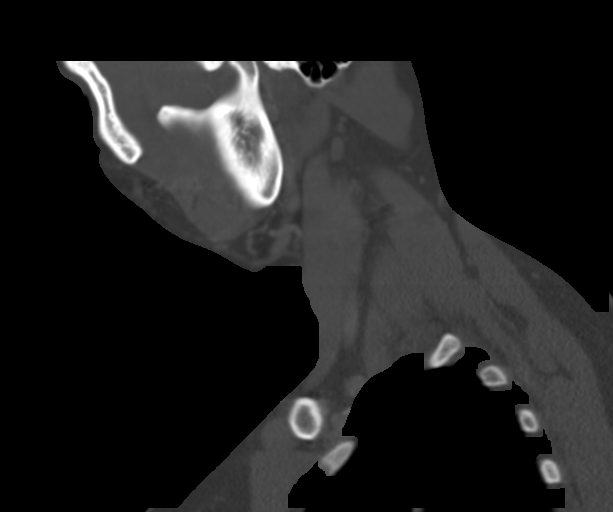
[im 24/70  bone]
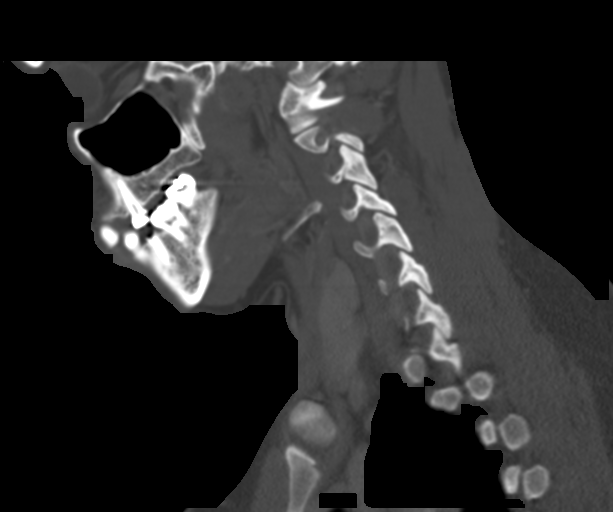
[im 35/70  bone]
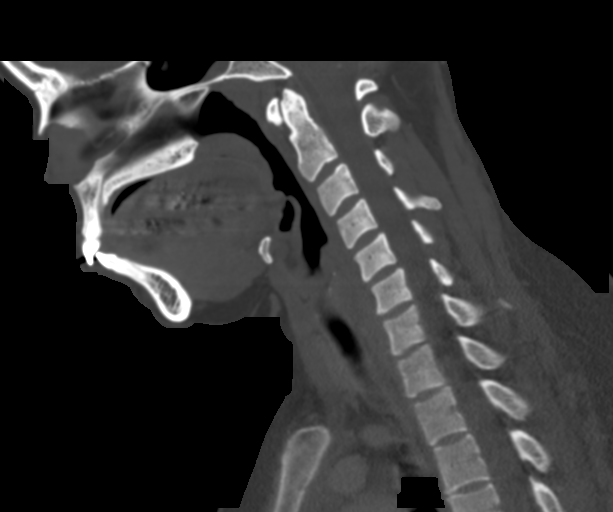
[im 47/70  bone]
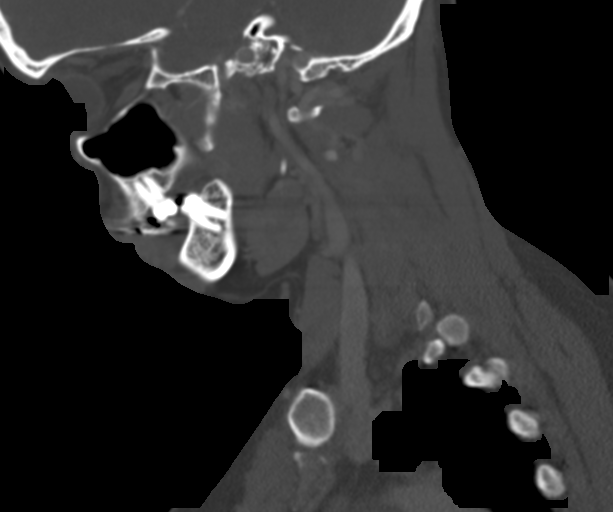
[im 58/70  bone]
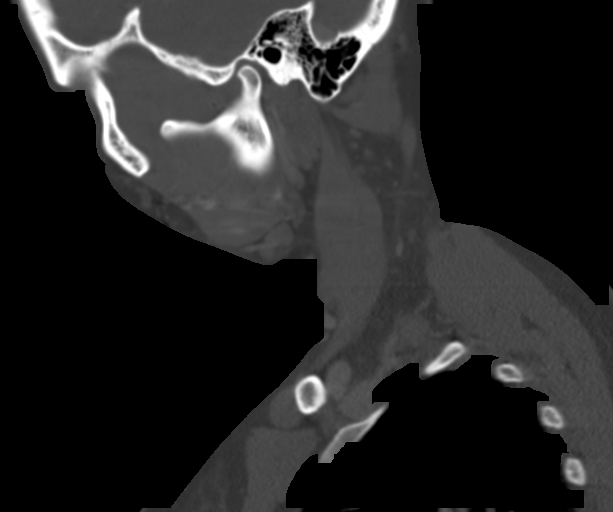

[Series 9: ax axial recons · axial · 0.39mm/px · z∈[+586,+690]mm · 3 of 105 slices shown]
[im 27/105  bone]
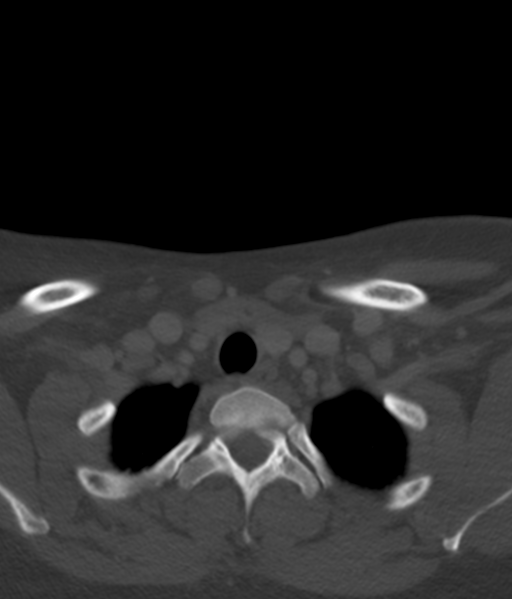
[im 53/105  bone]
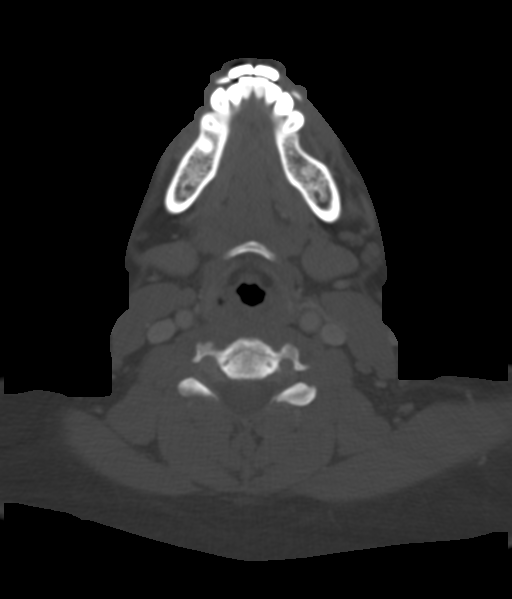
[im 79/105  bone]
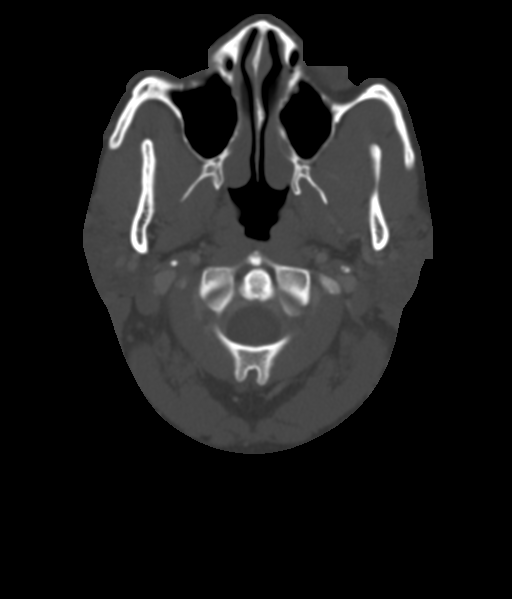

[13 of 33 positions shown; findings below may reference images not displayed]

FINDINGS: Pharynx and larynx: No focal mucosal or submucosal lesions are
present. There is mild prominence of the palatine tonsils and
adenoid tissue without a discrete mass. The vocal cords are midline
and symmetric.

Salivary glands: Salivary glands are within normal limits
bilaterally.

Thyroid: Mild heterogeneity is present in the thyroid without a
dominant lesion.

Lymph nodes: No significant adenopathy is present.

Vascular: Negative arterial and venous structures opacify normally.

Limited intracranial: Within normal limits.

Visualized orbits: Normal

Mastoids and visualized paranasal sinuses: Clear

Skeleton: Vertebral body heights and alignment are normal. There is
slight reversal of the normal cervical lordosis.

Upper chest: The lung apices are clear.
IMPRESSION: No acute or focal abnormality to explain the patient's right-sided
neck pain or visual loss.

## 2016-08-21 IMAGING — CT CT HEAD W/O CM
3 of 4 series · 15 of 47 positions shown, 18 images · non-contrast
Comparison: [DATE]

CLINICAL DATA: Right-sided neck pain radiating to the head

EXAM:
CT HEAD WITHOUT CONTRAST
TECHNIQUE: Contiguous axial images were obtained from the base of the skull
through the vertex without intravenous contrast.

[Series 2: head w/o · axial · non-contrast · 0.45mm/px · z∈[+738,+863]mm · 9 of 31 slices shown, 12 images]
[im 3/31  brain]
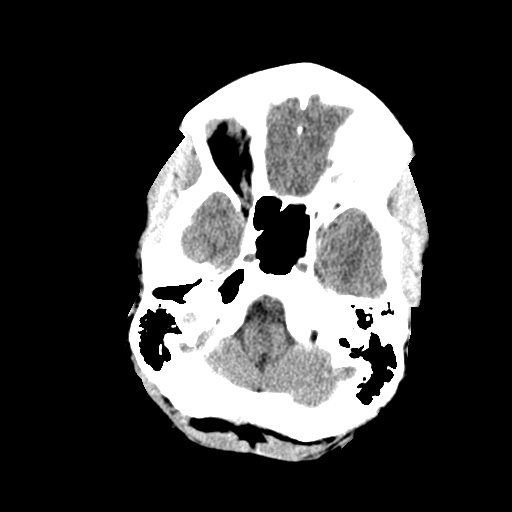
[im 3/31  bone]
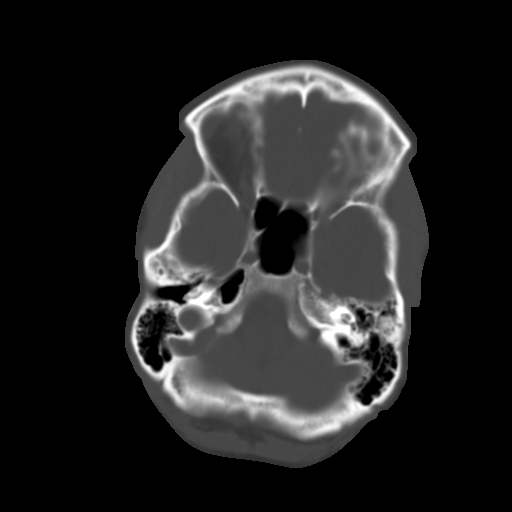
[im 7/31  brain]
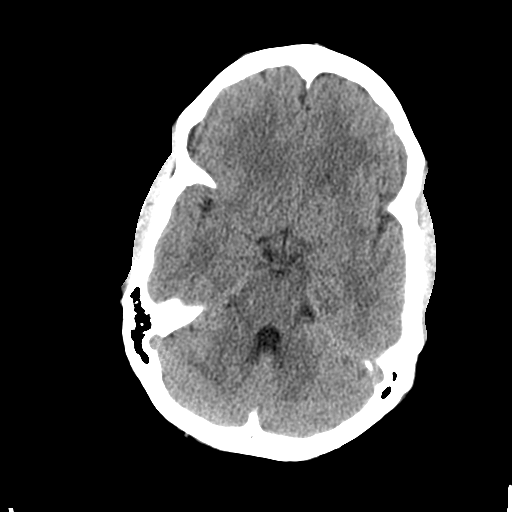
[im 9/31  brain]
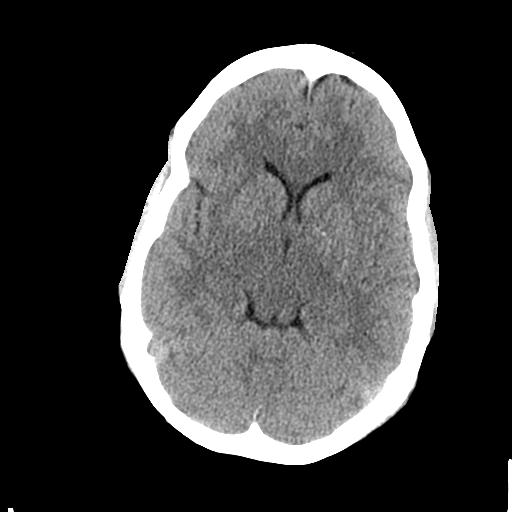
[im 13/31  brain]
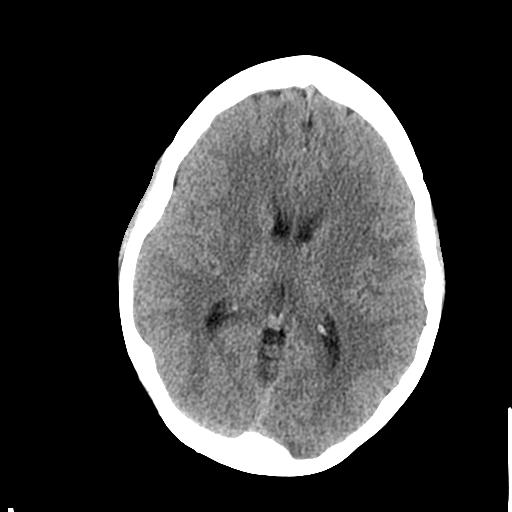
[im 16/31  brain]
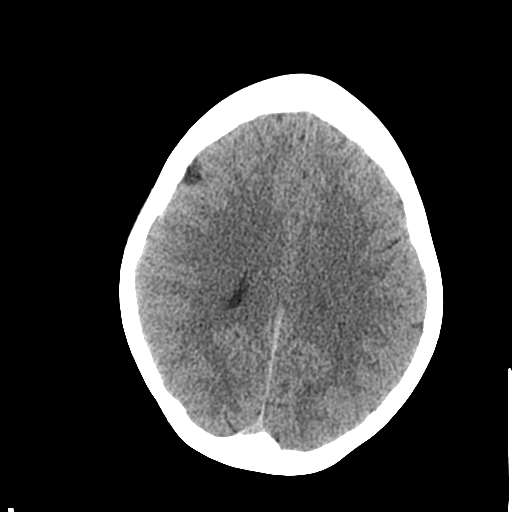
[im 16/31  bone]
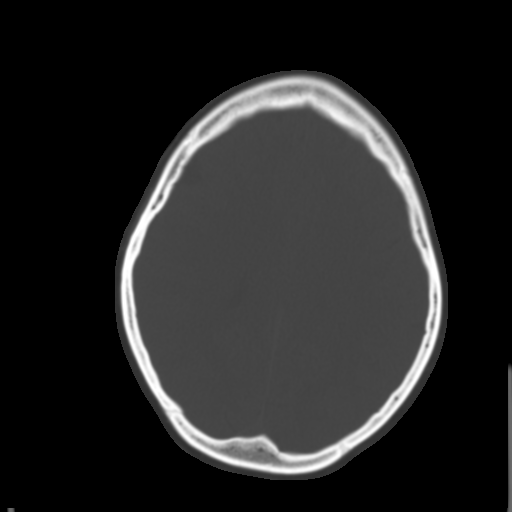
[im 18/31  brain]
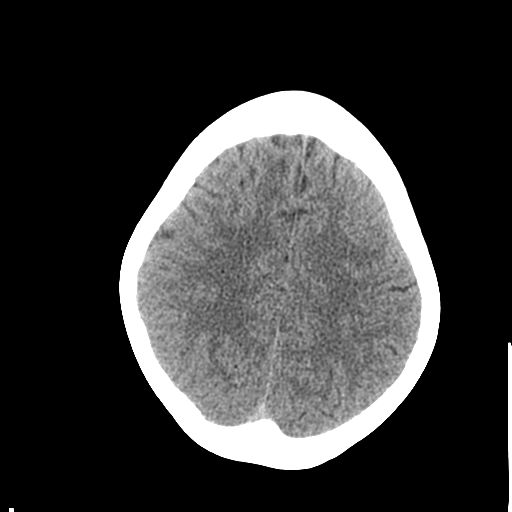
[im 22/31  brain]
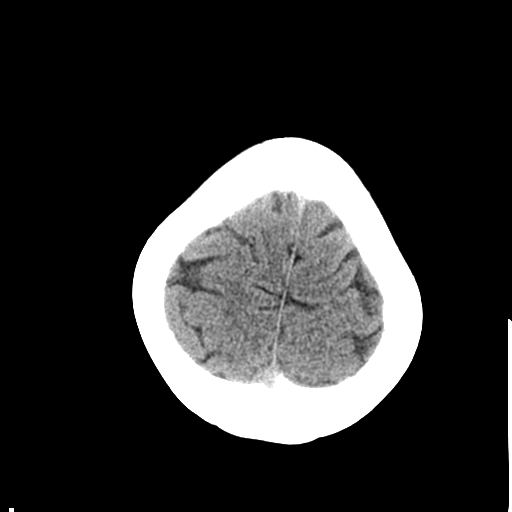
[im 24/31  brain]
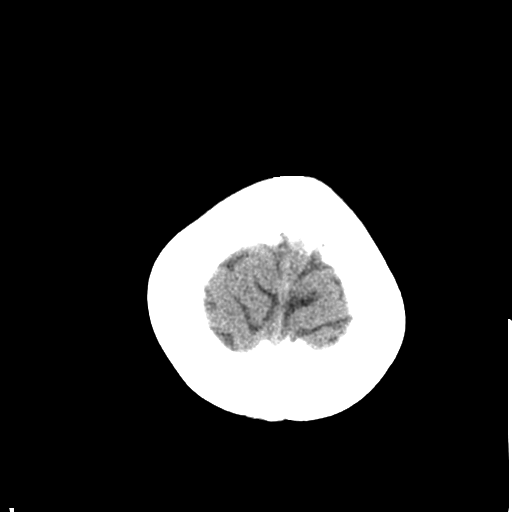
[im 28/31  brain]
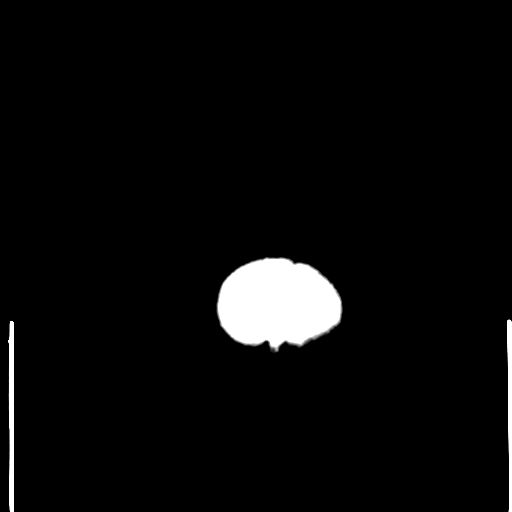
[im 28/31  bone]
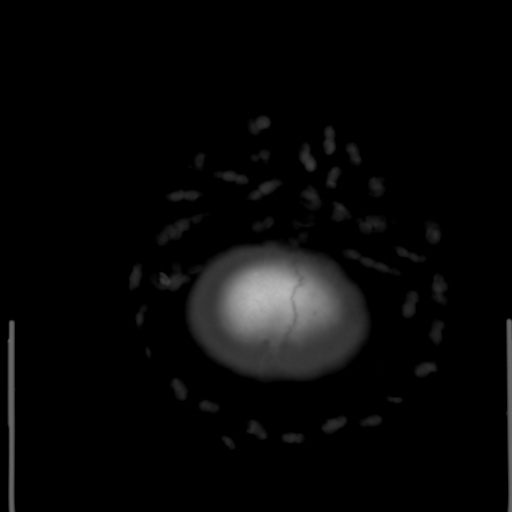

[Series 5: coronal · coronal · 0.29mm/px · 3 of 70 slices shown]
[im 24/70  brain]
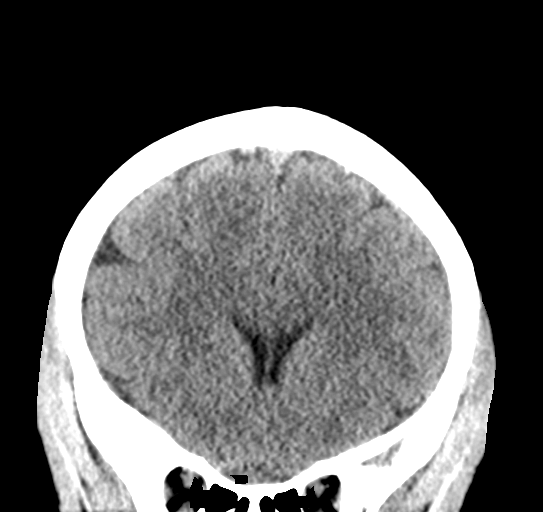
[im 31/70  brain]
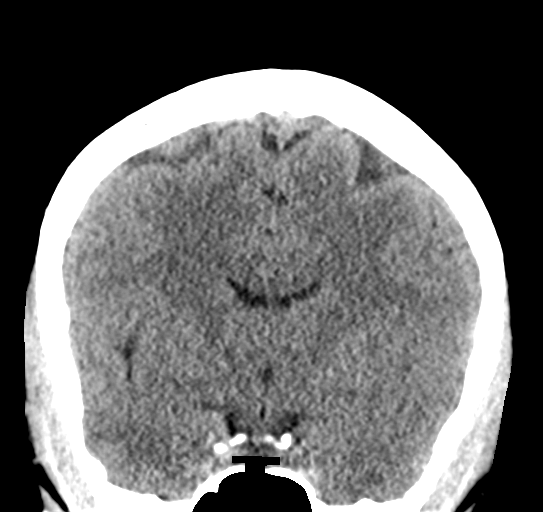
[im 39/70  brain]
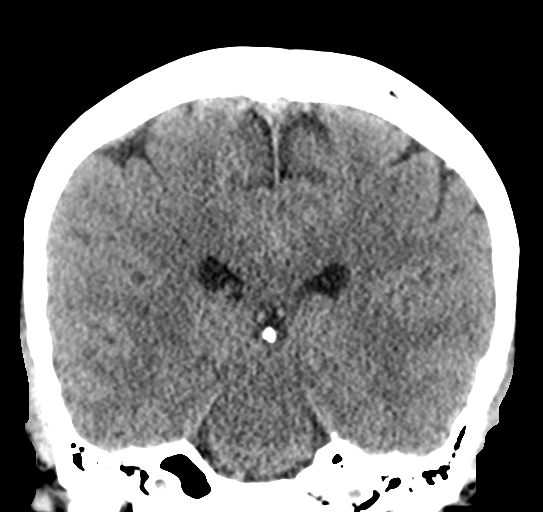

[Series 6: sagittal · sagittal · 0.29mm/px · 3 of 55 slices shown]
[im 19/55  brain]
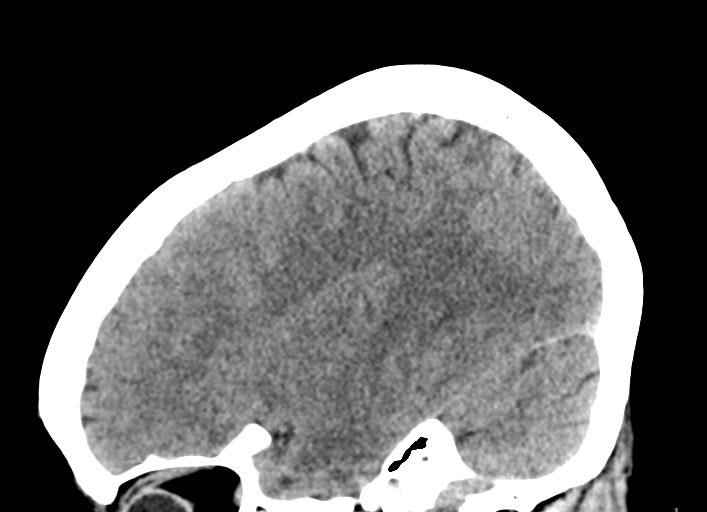
[im 28/55  brain]
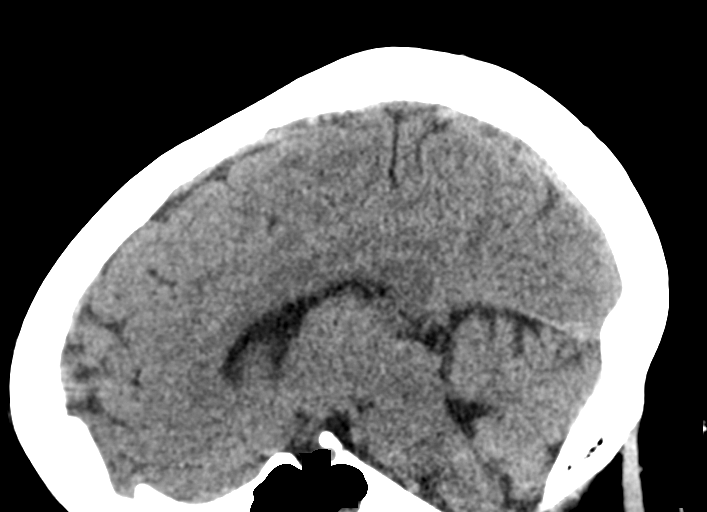
[im 37/55  brain]
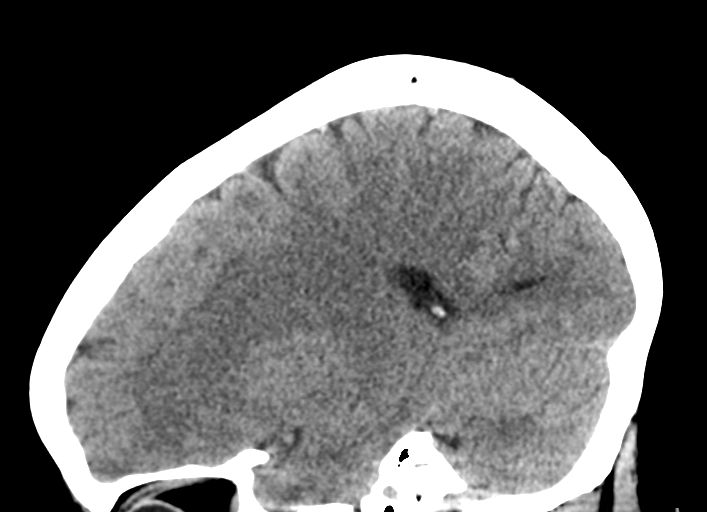

[15 of 47 positions shown; findings below may reference images not displayed]

FINDINGS: Brain: No evidence of acute infarction, hemorrhage, hydrocephalus,
extra-axial collection or mass lesion/mass effect.

Vascular: No hyperdense vessel or unexpected calcification.

Skull: Normal. Negative for fracture or focal lesion.

Sinuses/Orbits: No acute finding.

Other: None.
IMPRESSION: Negative CT examination of the brain

## 2016-08-21 MED ORDER — DIPHENHYDRAMINE HCL 50 MG/ML IJ SOLN
50.0000 mg | Freq: Once | INTRAMUSCULAR | Status: AC
  Administered 2016-08-21: 50 mg via INTRAMUSCULAR
  Filled 2016-08-21: qty 1

## 2016-08-21 MED ORDER — IOPAMIDOL (ISOVUE-300) INJECTION 61%
75.0000 mL | Freq: Once | INTRAVENOUS | Status: AC | PRN
  Administered 2016-08-21: 75 mL via INTRAVENOUS

## 2016-08-21 MED ORDER — IOPAMIDOL (ISOVUE-300) INJECTION 61%
INTRAVENOUS | Status: AC
  Filled 2016-08-21: qty 75

## 2016-08-21 MED ORDER — FLUORESCEIN SODIUM 0.6 MG OP STRP
1.0000 | ORAL_STRIP | Freq: Once | OPHTHALMIC | Status: AC
  Administered 2016-08-21: 1 via OPHTHALMIC

## 2016-08-21 MED ORDER — DEXAMETHASONE SODIUM PHOSPHATE 10 MG/ML IJ SOLN
10.0000 mg | Freq: Once | INTRAMUSCULAR | Status: AC
  Administered 2016-08-21: 10 mg via INTRAMUSCULAR
  Filled 2016-08-21: qty 1

## 2016-08-21 MED ORDER — PROCHLORPERAZINE EDISYLATE 5 MG/ML IJ SOLN
10.0000 mg | Freq: Once | INTRAMUSCULAR | Status: AC
  Administered 2016-08-21: 10 mg via INTRAMUSCULAR
  Filled 2016-08-21: qty 2

## 2016-08-21 NOTE — ED Notes (Signed)
Visual Acuity Bilateral Distance: 20/40  Right Distance: 20/70  Left Distance: 20/40

## 2016-08-21 NOTE — ED Triage Notes (Signed)
Pt is c/o right sided neck pain 10/10 radiating to her head. Reports onset as 2 days ago, denies changes to sensation to any of her extremities.

## 2016-08-21 NOTE — ED Provider Notes (Signed)
Patient signed out to me at shift change.  Patient with headache, right sided neck pain, and blurred vision from right eye.  Pending CTs.  CTs are negative.  Patient's symptoms have improved.  No longer has blurred vision.  Visual symptoms have improved as the headache has improved.  I suspect ocular vs complex migraine.  I discussed the patient with Dr. Dayna Barker, who agrees and agrees with plan for discharge to home with PCP/neurology follow-up.  Patient understands and agrees with the plan.     Montine Circle, PA-C 08/21/16 2149    Merrily Pew, MD 08/22/16 [phone]

## 2016-08-21 NOTE — ED Provider Notes (Signed)
[facility] DEPT Provider Note   CSN: [id number] Arrival date & time: 08/21/16  1720   By signing my name below, I, Susan Guerrero, attest that this documentation has been prepared under the direction and in the presence of Susan Jefferson, PA-C. Electronically Signed: Neta Guerrero, ED Scribe. 08/21/2016. 8:31 PM.   History   Chief Complaint Chief Complaint  Patient presents with  . Neck Pain   The history is provided by the patient. No language interpreter was used.   HPI Comments:  Susan Guerrero is a 41 y.o. female who presents to the Emergency Department complaining of constant right sided neck pain x 2 days. She states that the pain is radiating up her head, and notes pain to the right side of her face. Pt complains of associated visual change to her right eye. She states that the neck pain is made worse with ROM, and light causes her right eye and face to hurt.Pt states that she first noticed the pain while driving 2 days ago. Pt denies any previous similar problems. LNMP was 08/08/16, and denies any possible pregnancy. She does not smoke or drink EtOH. Pt does not wear corrective lenses. No alleviating factors noted. Pt denies fever, nausea, weakness, numbness, rash or recent illnesses.     PCP: [facility]   Past Medical History:  Diagnosis Date  . Asthma   . Varicose veins     There are no active problems to display for this patient.   History reviewed. No pertinent surgical history.  OB History    No data available       Home Medications    Prior to Admission medications   Medication Sig Start Date End Date Taking? Authorizing Provider  albuterol (VENTOLIN HFA) 108 (90 BASE) MCG/ACT inhaler Inhale 2 puffs into the lungs every 4 (four) hours as needed for shortness of breath.     Historical Provider, MD  azithromycin (ZITHROMAX) 250 MG tablet Take 1 tablet (250 mg total) by mouth daily. Take first 2 tablets together, then 1  every day until finished. 09/17/15   Recardo Evangelist, PA-C  benzonatate (TESSALON) 100 MG capsule Take 1 capsule (100 mg total) by mouth every 8 (eight) hours. 09/17/15   Recardo Evangelist, PA-C  fluconazole (DIFLUCAN) 150 MG tablet Take 1 tablet (150 mg total) by mouth daily. Repeat dose in 3 days 09/13/15   Waldemar Dickens, MD  ibuprofen (ADVIL,MOTRIN) 800 MG tablet Take 1 tablet (800 mg total) by mouth 3 (three) times daily. 06/17/16   Hannah Muthersbaugh, PA-C  levofloxacin (LEVAQUIN) 750 MG tablet Take 1 tablet (750 mg total) by mouth daily. 09/13/15   Waldemar Dickens, MD  methocarbamol (ROBAXIN) 500 MG tablet Take 1 tablet (500 mg total) by mouth 2 (two) times daily. 06/17/16   Hannah Muthersbaugh, PA-C  metoCLOPramide (REGLAN) 10 MG tablet Take 1 tablet (10 mg total) by mouth every 6 (six) hours. 09/17/15   Recardo Evangelist, PA-C  ondansetron (ZOFRAN-ODT) 4 MG disintegrating tablet Take 1 tablet (4 mg total) by mouth every 8 (eight) hours as needed for nausea or vomiting. 09/13/15   Waldemar Dickens, MD    Family History Family History  Problem Relation Age of Onset  . Hypertension Father   . Migraines Sister   . Asthma Brother   . Obesity Brother   . Cancer Maternal Aunt     ovarian  . Cancer Maternal Grandmother     breast  Social History Social History  Substance Use Topics  . Smoking status: Never Smoker  . Smokeless tobacco: Never Used  . Alcohol use Yes     Comment: occasionally     Allergies   Flagyl [metronidazole hcl]   Review of Systems Review of Systems  Constitutional: Negative for fever.  Eyes: Positive for photophobia and visual disturbance.  Gastrointestinal: Negative for nausea.  Musculoskeletal: Positive for myalgias and neck pain.  Skin: Negative.   Neurological: Positive for headaches. Negative for weakness and numbness.     Physical Exam Updated Vital Signs BP 117/82 (BP Location: Right Arm)   Pulse 77   Temp 98 F (36.7 C) (Oral)   Resp 18    Ht 5\' 2"  (1.575 m)   Wt 75.8 kg   SpO2 99%   BMI 30.54 kg/m   Physical Exam  Constitutional: She is oriented to person, place, and time. She appears well-developed and well-nourished. No distress.  Uncomfortable appearing  HENT:  Head: Normocephalic and atraumatic.  Mouth/Throat: Oropharynx is clear and moist.  Eyes: Conjunctivae and EOM are normal. Pupils are equal, round, and reactive to light. Right conjunctiva is not injected. Left conjunctiva is not injected. Right eye exhibits normal extraocular motion and no nystagmus. Left eye exhibits normal extraocular motion and no nystagmus.  Slit lamp exam:      The right eye shows no corneal abrasion, no corneal flare, no corneal ulcer, no hyphema and no fluorescein uptake.  Visual Acuity Bilateral Distance: 20/40  Right Distance: 20/70  Left Distance: 20/40  Pt denies eye pain, but reports pressure around the right orbit with EOM movements.  Neck: Neck supple. No spinous process tenderness present. No edema, no erythema and normal range of motion present.  ttp along right posterior neck and scalp, also ttp right face including forehead.  No edema, no erythema.  Pt has FROM of cervical spine for exam but with discomfort.  Loss of normal lordosis on exam.   Cardiovascular: Normal rate and normal heart sounds.   Pulmonary/Chest: Effort normal.  Abdominal: Soft. She exhibits no distension. There is no tenderness.  Lymphadenopathy:    She has no cervical adenopathy.  Neurological: She is alert and oriented to person, place, and time. She has normal strength. No cranial nerve deficit or sensory deficit. Gait normal. GCS eye subscore is 4. GCS verbal subscore is 5. GCS motor subscore is 6.   normal rapid alternating movements, gait normal. Cranial nerves III-XII intact.  No pronator drift.  Skin: Skin is warm and dry. No rash noted.  Psychiatric: She has a normal mood and affect. Her speech is normal and behavior is normal. Thought content  normal. Cognition and memory are normal.  Nursing note and vitals reviewed.    ED Treatments / Results  DIAGNOSTIC STUDIES:  Oxygen Saturation is 100% on RA, normal by my interpretation.    COORDINATION OF CARE:  6:12 PM Discussed treatment plan with pt at bedside and pt agreed to plan.   Labs (all labs ordered are listed, but only abnormal results are displayed) Labs Reviewed - No data to display  EKG  EKG Interpretation None       Radiology Ct Head Wo Contrast  Result Date: 08/21/2016 CLINICAL DATA:  Right-sided neck pain radiating to the head EXAM: CT HEAD WITHOUT CONTRAST TECHNIQUE: Contiguous axial images were obtained from the base of the skull through the vertex without intravenous contrast. COMPARISON:  08/21/2012 FINDINGS: Brain: No evidence of acute infarction, hemorrhage, hydrocephalus, extra-axial  collection or mass lesion/mass effect. Vascular: No hyperdense vessel or unexpected calcification. Skull: Normal. Negative for fracture or focal lesion. Sinuses/Orbits: No acute finding. Other: None. IMPRESSION: Negative CT examination of the brain Electronically Signed   By: Donavan Foil M.D.   On: 08/21/2016 19:10    Procedures Procedures (including critical care time)  Medications Ordered in ED Medications  fluorescein ophthalmic strip 1 strip (not administered)  iopamidol (ISOVUE-300) 61 % injection (not administered)  prochlorperazine (COMPAZINE) injection 10 mg (10 mg Intramuscular Given 08/21/16 1947)  dexamethasone (DECADRON) injection 10 mg (10 mg Intramuscular Given 08/21/16 1946)  diphenhydrAMINE (BENADRYL) injection 50 mg (50 mg Intramuscular Given 08/21/16 1947)     Initial Impression / Assessment and Plan / ED Course  I have reviewed the triage vital signs and the nursing notes.  Pertinent labs & imaging results that were available during my care of the patient were reviewed by me and considered in my medical decision making (see chart for details).      Pt with right sided head, neck and facial pain with right eye blurred vision.  Pending CT head and neck with contrast to assess for deep tissue abscess/ and rule out venous cavernous thrombosis.  Eye exam unremarkable, stain performed with no dye uptake, dendrites, etc.  Pt with no rash suggesting shingles presentation.    Discussed with Lorre Munroe PA-C who assumes care.   Final Clinical Impressions(s) / ED Diagnoses   Final diagnoses:  None    New Prescriptions New Prescriptions   No medications on file  I personally performed the services described in this documentation, which was scribed in my presence. The recorded information has been reviewed and is accurate.     Susan Jefferson, PA-C 08/21/16 2031    Merrily Pew, MD 08/22/16 1150

## 2016-08-21 NOTE — ED Notes (Signed)
Patient transported to CT 

## 2016-09-08 ENCOUNTER — Other Ambulatory Visit (HOSPITAL_COMMUNITY): Payer: Self-pay | Admitting: Internal Medicine

## 2016-09-08 DIAGNOSIS — N921 Excessive and frequent menstruation with irregular cycle: Secondary | ICD-10-CM

## 2016-09-21 ENCOUNTER — Ambulatory Visit (HOSPITAL_COMMUNITY)
Admission: RE | Admit: 2016-09-21 | Discharge: 2016-09-21 | Disposition: A | Discharge status: Home / Self Care | Payer: Medicaid Other | Source: Ambulatory Visit | Attending: Internal Medicine | Admitting: Internal Medicine

## 2016-09-21 DIAGNOSIS — D259 Leiomyoma of uterus, unspecified: Secondary | ICD-10-CM | POA: Diagnosis present

## 2016-09-21 DIAGNOSIS — Z87898 Personal history of other specified conditions: Secondary | ICD-10-CM | POA: Diagnosis not present

## 2016-09-21 DIAGNOSIS — N839 Noninflammatory disorder of ovary, fallopian tube and broad ligament, unspecified: Secondary | ICD-10-CM | POA: Insufficient documentation

## 2016-09-21 DIAGNOSIS — N921 Excessive and frequent menstruation with irregular cycle: Secondary | ICD-10-CM | POA: Diagnosis present

## 2017-06-26 ENCOUNTER — Encounter (HOSPITAL_COMMUNITY): Payer: Self-pay | Admitting: Emergency Medicine

## 2017-06-26 ENCOUNTER — Emergency Department (HOSPITAL_COMMUNITY): Payer: Medicaid Other

## 2017-06-26 ENCOUNTER — Emergency Department (HOSPITAL_COMMUNITY)
Admission: EM | Admit: 2017-06-26 | Discharge: 2017-06-26 | Disposition: A | Discharge status: Home / Self Care | Payer: Medicaid Other | Attending: Emergency Medicine | Admitting: Emergency Medicine

## 2017-06-26 DIAGNOSIS — K5289 Other specified noninfective gastroenteritis and colitis: Secondary | ICD-10-CM | POA: Insufficient documentation

## 2017-06-26 DIAGNOSIS — Z79899 Other long term (current) drug therapy: Secondary | ICD-10-CM | POA: Insufficient documentation

## 2017-06-26 DIAGNOSIS — J40 Bronchitis, not specified as acute or chronic: Secondary | ICD-10-CM | POA: Diagnosis not present

## 2017-06-26 DIAGNOSIS — J029 Acute pharyngitis, unspecified: Secondary | ICD-10-CM | POA: Diagnosis present

## 2017-06-26 DIAGNOSIS — K529 Noninfective gastroenteritis and colitis, unspecified: Secondary | ICD-10-CM

## 2017-06-26 LAB — URINALYSIS, ROUTINE W REFLEX MICROSCOPIC
Bilirubin Urine: NEGATIVE
GLUCOSE, UA: NEGATIVE mg/dL
Ketones, ur: 5 mg/dL — AB
Nitrite: NEGATIVE
Protein, ur: 30 mg/dL — AB
SPECIFIC GRAVITY, URINE: 1.028 (ref 1.005–1.030)
pH: 5 (ref 5.0–8.0)

## 2017-06-26 LAB — COMPREHENSIVE METABOLIC PANEL
ALBUMIN: 3.6 g/dL (ref 3.5–5.0)
ALK PHOS: 77 U/L (ref 38–126)
ALT: 17 U/L (ref 14–54)
AST: 25 U/L (ref 15–41)
Anion gap: 6 (ref 5–15)
BUN: 7 mg/dL (ref 6–20)
CALCIUM: 8.5 mg/dL — AB (ref 8.9–10.3)
CO2: 26 mmol/L (ref 22–32)
CREATININE: 0.81 mg/dL (ref 0.44–1.00)
Chloride: 107 mmol/L (ref 101–111)
GFR calc Af Amer: >60 mL/min (ref >60)
GFR calc non Af Amer: >60 mL/min (ref >60)
GLUCOSE: 108 mg/dL — AB (ref 65–99)
Potassium: 3.2 mmol/L — ABNORMAL LOW (ref 3.5–5.1)
SODIUM: 139 mmol/L (ref 135–145)
Total Bilirubin: 0.6 mg/dL (ref 0.3–1.2)
Total Protein: 7.2 g/dL (ref 6.5–8.1)

## 2017-06-26 LAB — CBC
HCT: 36.2 % (ref 36.0–46.0)
Hemoglobin: 11.7 g/dL — ABNORMAL LOW (ref 12.0–15.0)
MCH: 27.5 pg (ref 26.0–34.0)
MCHC: 32.3 g/dL (ref 30.0–36.0)
MCV: 85 fL (ref 78.0–100.0)
PLATELETS: 336 10*3/uL (ref 150–400)
RBC: 4.26 MIL/uL (ref 3.87–5.11)
RDW: 13.5 % (ref 11.5–15.5)
WBC: 8.7 10*3/uL (ref 4.0–10.5)

## 2017-06-26 LAB — I-STAT BETA HCG BLOOD, ED (MC, WL, AP ONLY)

## 2017-06-26 LAB — LIPASE, BLOOD: Lipase: 26 U/L (ref 11–51)

## 2017-06-26 LAB — RAPID STREP SCREEN (MED CTR MEBANE ONLY): STREPTOCOCCUS, GROUP A SCREEN (DIRECT): NEGATIVE

## 2017-06-26 IMAGING — DX DG CHEST 2V
2 series · 2 of 2 positions shown · non-contrast
Comparison: [DATE]

CLINICAL DATA: Cough

EXAM:
CHEST  2 VIEW

[chest pa]
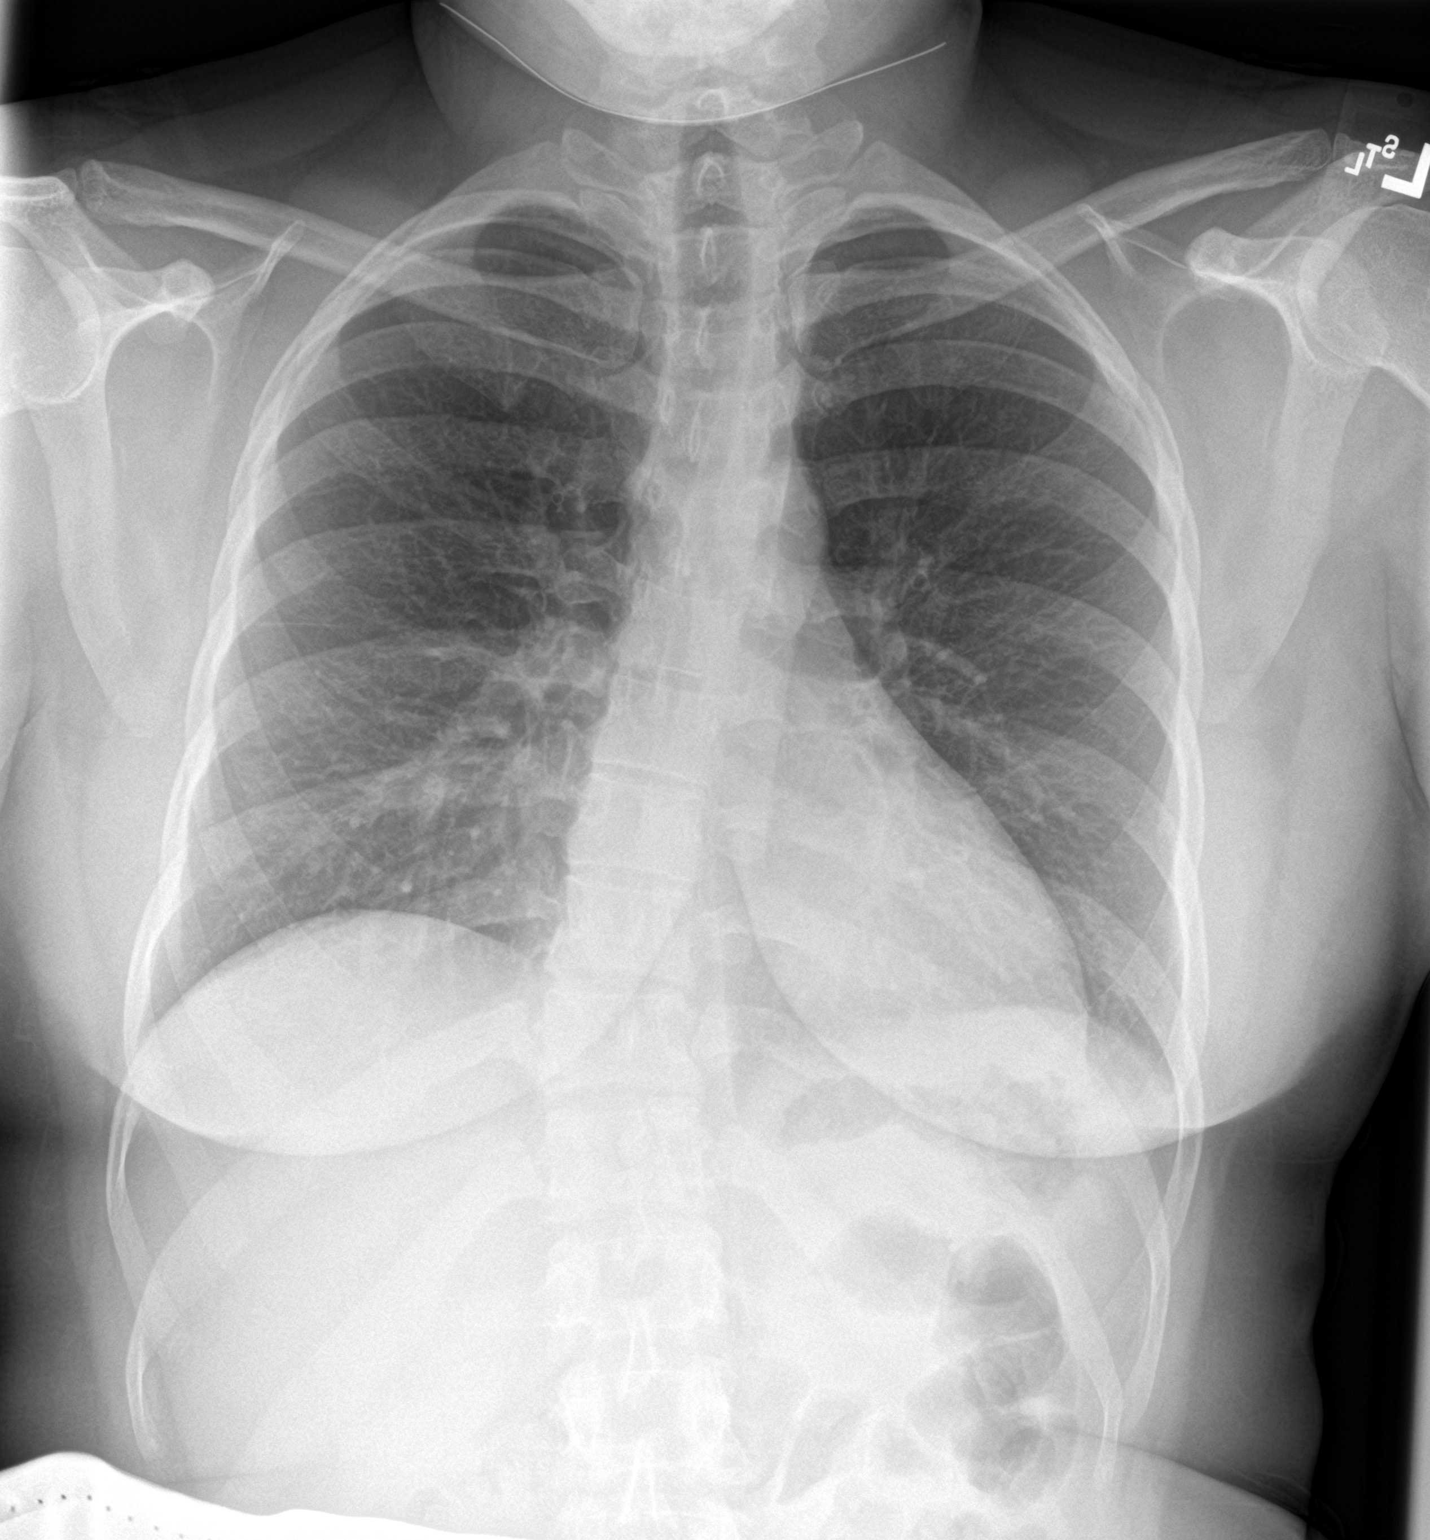

[chest lat]
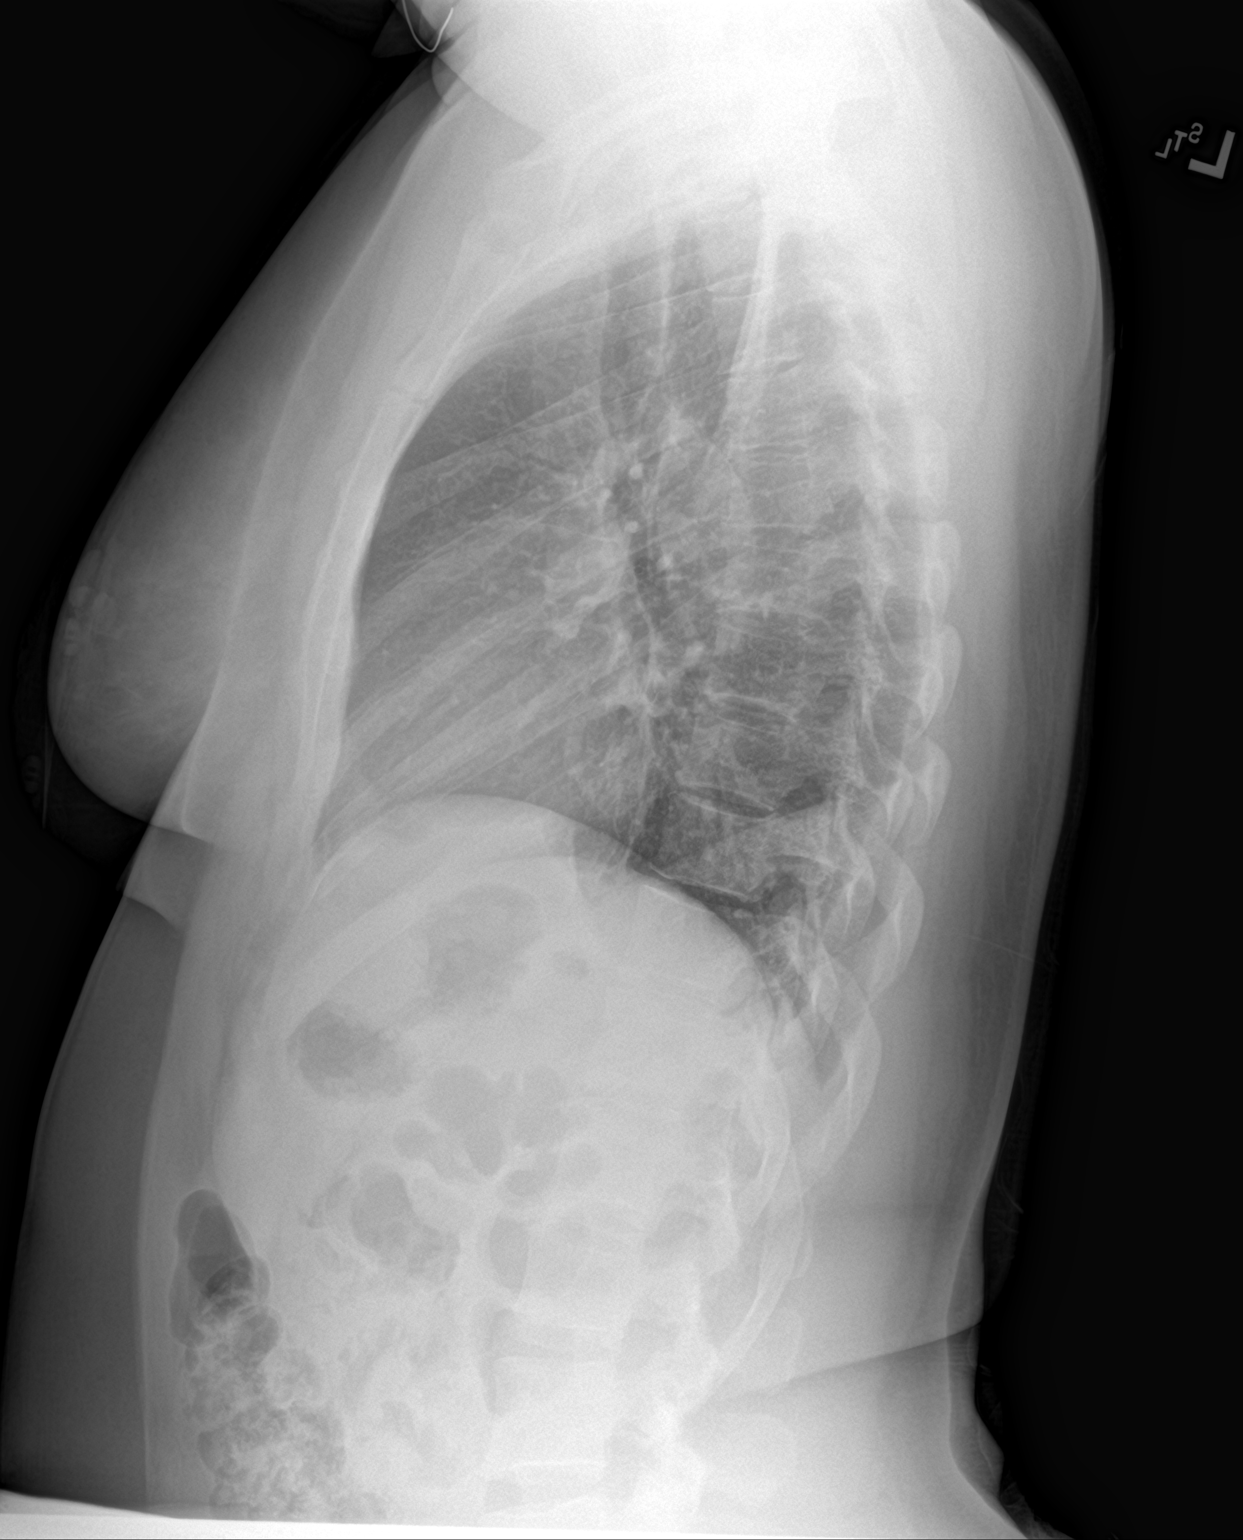

[2 of 2 positions shown; findings below may reference images not displayed]

FINDINGS: Heart and mediastinal contours are within normal limits. No focal
opacities or effusions. No acute bony abnormality.
IMPRESSION: No active cardiopulmonary disease.

## 2017-06-26 MED ORDER — HYDROCOD POLST-CPM POLST ER 10-8 MG/5ML PO SUER
5.0000 mL | Freq: Once | ORAL | Status: AC
  Administered 2017-06-26: 5 mL via ORAL
  Filled 2017-06-26: qty 5

## 2017-06-26 MED ORDER — IPRATROPIUM-ALBUTEROL 0.5-2.5 (3) MG/3ML IN SOLN
3.0000 mL | Freq: Once | RESPIRATORY_TRACT | Status: AC
  Administered 2017-06-26: 3 mL via RESPIRATORY_TRACT
  Filled 2017-06-26: qty 3

## 2017-06-26 MED ORDER — PREDNISONE 50 MG PO TABS: ORAL_TABLET | ORAL | 0 refills | Status: DC

## 2017-06-26 MED ORDER — ONDANSETRON HCL 4 MG/2ML IJ SOLN
4.0000 mg | Freq: Once | INTRAMUSCULAR | Status: AC
  Administered 2017-06-26: 4 mg via INTRAVENOUS
  Filled 2017-06-26: qty 2

## 2017-06-26 MED ORDER — SODIUM CHLORIDE 0.9 % IV SOLN
INTRAVENOUS | Status: AC
  Administered 2017-06-26: 14:00:00 via INTRAVENOUS

## 2017-06-26 MED ORDER — BENZONATATE 100 MG PO CAPS: 100.0000 mg | ORAL_CAPSULE | Freq: Three times a day (TID) | ORAL | 0 refills | Status: DC

## 2017-06-26 MED ORDER — ALBUTEROL SULFATE HFA 108 (90 BASE) MCG/ACT IN AERS
2.0000 | INHALATION_SPRAY | RESPIRATORY_TRACT | Status: DC | PRN
  Administered 2017-06-26: 2 via RESPIRATORY_TRACT
  Filled 2017-06-26: qty 6.7

## 2017-06-26 MED ORDER — ONDANSETRON 4 MG PO TBDP: 4.0000 mg | ORAL_TABLET | Freq: Three times a day (TID) | ORAL | 0 refills | Status: DC | PRN

## 2017-06-26 NOTE — ED Provider Notes (Signed)
[facility] DEPT Provider Note   CSN: [id number] Arrival date & time: 06/26/17  1041     History   Chief Complaint Chief Complaint  Patient presents with  . Sore Throat  . Abdominal Pain  . Emesis  . Cough    HPI Susan Guerrero is a 42 y.o. female who presents to the ED with sore throat, cough, congestion, chills, ?fever. The symptoms started a week ago and have gotten worse. Patient has been taking Mucinex and ibuprofen which helps a little.   The history is provided by the patient. No language interpreter was used.  Sore Throat  This is a new problem. The current episode started more than 2 days ago. The problem occurs constantly. The problem has been gradually worsening. Associated symptoms include abdominal pain and shortness of breath. Pertinent negatives include no chest pain and no headaches. Nothing aggravates the symptoms. Nothing relieves the symptoms. She has tried acetaminophen for the symptoms. The treatment provided no relief.  Abdominal Pain   Associated symptoms include diarrhea, nausea, vomiting and myalgias. Pertinent negatives include dysuria, hematuria and headaches. Fever: ?  Emesis   Associated symptoms include abdominal pain, chills, cough, diarrhea, myalgias and URI. Pertinent negatives include no headaches. Fever: ?  Cough  Associated symptoms include chills, ear pain, sore throat, myalgias, shortness of breath and wheezing. Pertinent negatives include no chest pain and no headaches.  URI   The current episode started more than 2 days ago. The problem has been gradually worsening. Associated symptoms include abdominal pain, diarrhea, nausea, vomiting, congestion, ear pain, sore throat, cough and wheezing. Pertinent negatives include no chest pain, no dysuria, no headaches and no rash. She has tried NSAIDs for the symptoms.    Past Medical History:  Diagnosis Date  . Asthma   . Varicose veins     There are no active  problems to display for this patient.   History reviewed. No pertinent surgical history.  OB History    No data available       Home Medications    Prior to Admission medications   Medication Sig Start Date End Date Taking? Authorizing Provider  albuterol (VENTOLIN HFA) 108 (90 BASE) MCG/ACT inhaler Inhale 2 puffs into the lungs every 4 (four) hours as needed for shortness of breath.     [provider]  benzonatate (TESSALON) 100 MG capsule Take 1 capsule (100 mg total) by mouth every 8 (eight) hours. 06/26/17   Ashley Murrain, NP  ibuprofen (ADVIL,MOTRIN) 800 MG tablet Take 1 tablet (800 mg total) by mouth 3 (three) times daily. 06/17/16   Muthersbaugh, Jarrett Soho, PA-C  ondansetron (ZOFRAN ODT) 4 MG disintegrating tablet Take 1 tablet (4 mg total) by mouth every 8 (eight) hours as needed for nausea or vomiting. 06/26/17   Ashley Murrain, NP  predniSONE (DELTASONE) 50 MG tablet Take one tablet PO daily. 06/26/17   Ashley Murrain, NP    Family History Family History  Problem Relation Age of Onset  . Hypertension Father   . Migraines Sister   . Asthma Brother   . Obesity Brother   . Cancer Maternal Aunt        ovarian  . Cancer Maternal Grandmother        breast    Social History Social History   Tobacco Use  . Smoking status: Never Smoker  . Smokeless tobacco: Never Used  Substance Use Topics  . Alcohol use: Yes    Comment: occasionally  .  Drug use: No     Allergies   Flagyl [metronidazole hcl]   Review of Systems Review of Systems  Constitutional: Positive for chills. Fever: ?  HENT: Positive for congestion, ear pain and sore throat. Negative for ear discharge and trouble swallowing.   Eyes: Negative for pain, discharge and itching.  Respiratory: Positive for cough, shortness of breath and wheezing.   Cardiovascular: Negative for chest pain and palpitations.  Gastrointestinal: Positive for abdominal pain, diarrhea, nausea and vomiting.  Genitourinary:  Negative for dysuria, hematuria, vaginal bleeding and vaginal discharge.  Musculoskeletal: Positive for myalgias.  Skin: Negative for rash.  Neurological: Negative for syncope and headaches.  Hematological: Negative for adenopathy.  Psychiatric/Behavioral: Negative for confusion.     Physical Exam Updated Vital Signs BP 112/87 (BP Location: Left Arm)   Pulse 78   Temp 98.7 F (37.1 C) (Oral)   Resp 18   LMP 06/21/2017   SpO2 100%   Physical Exam  Constitutional: She appears well-developed and well-nourished. No distress.  HENT:  Head: Normocephalic and atraumatic.  Right Ear: Tympanic membrane normal.  Left Ear: Tympanic membrane normal.  Mouth/Throat: Uvula is midline and mucous membranes are normal. No uvula swelling. Posterior oropharyngeal erythema present. No oropharyngeal exudate or posterior oropharyngeal edema.  Eyes: Conjunctivae and EOM are normal. Pupils are equal, round, and reactive to light.  Neck: Normal range of motion. Neck supple.  Cardiovascular: Normal rate and regular rhythm.  Pulmonary/Chest: Effort normal. No respiratory distress. She has wheezes.  Abdominal: Soft. Bowel sounds are normal. There is no tenderness.  Musculoskeletal: Normal range of motion. She exhibits no edema.  Lymphadenopathy:    She has no cervical adenopathy.  Neurological: She is alert. No cranial nerve deficit.  Skin: Skin is warm and dry.  Psychiatric: She has a normal mood and affect. Her behavior is normal.  Nursing note and vitals reviewed.    ED Treatments / Results  Labs (all labs ordered are listed, but only abnormal results are displayed) Labs Reviewed  COMPREHENSIVE METABOLIC PANEL - Abnormal; Notable for the following components:      Result Value   Potassium 3.2 (*)    Glucose, Bld 108 (*)    Calcium 8.5 (*)    All other components within normal limits  CBC - Abnormal; Notable for the following components:   Hemoglobin 11.7 (*)    All other components within  normal limits  URINALYSIS, ROUTINE W REFLEX MICROSCOPIC - Abnormal; Notable for the following components:   APPearance HAZY (*)    Hgb urine dipstick SMALL (*)    Ketones, ur 5 (*)    Protein, ur 30 (*)    Leukocytes, UA TRACE (*)    Bacteria, UA FEW (*)    Squamous Epithelial / LPF 6-30 (*)    All other components within normal limits  RAPID STREP SCREEN (NOT AT [facility])  CULTURE, GROUP A STREP ([facility])  URINE CULTURE  LIPASE, BLOOD  I-STAT BETA HCG BLOOD, ED ([facility], [facility], [facility] ONLY)    Radiology Dg Chest 2 View  Result Date: 06/26/2017 CLINICAL DATA:  Cough EXAM: CHEST  2 VIEW COMPARISON:  09/17/2015 FINDINGS: Heart and mediastinal contours are within normal limits. No focal opacities or effusions. No acute bony abnormality. IMPRESSION: No active cardiopulmonary disease. Electronically Signed   By: Rolm Baptise M.D.   On: 06/26/2017 12:08    Procedures Procedures (including critical care time)  Medications Ordered in ED Medications  0.9 %  sodium chloride infusion ( Intravenous  New Bag/Given 06/26/17 1425)  albuterol (PROVENTIL HFA;VENTOLIN HFA) 108 (90 Base) MCG/ACT inhaler 2 puff (not administered)  ondansetron (ZOFRAN) injection 4 mg (4 mg Intravenous Given 06/26/17 1424)  chlorpheniramine-HYDROcodone (TUSSIONEX) 10-8 MG/5ML suspension 5 mL (5 mLs Oral Given 06/26/17 1425)  ipratropium-albuterol (DUONEB) 0.5-2.5 (3) MG/3ML nebulizer solution 3 mL (3 mLs Nebulization Given 06/26/17 1418)   Patient's symptoms improved with Duoneb.   Initial Impression / Assessment and Plan / ED Course  I have reviewed the triage vital signs and the nursing notes. Pt CXR negative for acute infiltrate. Patients symptoms are consistent with bronchitis with bronchospasm, likely viral etiology. Discussed that antibiotics are not indicated for viral infections. Pt will be discharged with symptomatic treatment.  Verbalizes understanding and is agreeable with plan. Pt is hemodynamically stable & in NAD prior to dc.  Albuterol inhaler given prior to d/c.  Patient also with episodes of n/v. Symptoms improved with IV hydration and medication for nausea. Will Rx Zofran and return precautions discussed.   Final Clinical Impressions(s) / ED Diagnoses   Final diagnoses:  Gastroenteritis  Bronchitis    ED Discharge Orders        Ordered    ondansetron (ZOFRAN ODT) 4 MG disintegrating tablet  Every 8 hours PRN     06/26/17 1600    benzonatate (TESSALON) 100 MG capsule  Every 8 hours     06/26/17 1600    predniSONE (DELTASONE) 50 MG tablet     06/26/17 1600       Janit Bern Scott, NP 06/26/17 1605    Milton Ferguson, MD 06/27/17 [phone]

## 2017-06-26 NOTE — ED Triage Notes (Signed)
Patient here from home with complaints of difficulty swallowing due to sore throat x1 week.Abdominal pain, nausea, vomiting x3 days also.

## 2017-06-26 NOTE — Discharge Instructions (Signed)
Use the inhaler 2 puffs every 4 hours as needed. Follow up with your doctor.  Return here as needed.

## 2017-06-28 LAB — URINE CULTURE

## 2017-06-29 LAB — CULTURE, GROUP A STREP (THRC)

## 2018-02-04 ENCOUNTER — Other Ambulatory Visit: Payer: Self-pay

## 2018-02-04 DIAGNOSIS — R609 Edema, unspecified: Secondary | ICD-10-CM

## 2018-03-22 ENCOUNTER — Encounter: Payer: Self-pay | Admitting: Vascular Surgery

## 2018-03-22 ENCOUNTER — Ambulatory Visit (INDEPENDENT_AMBULATORY_CARE_PROVIDER_SITE_OTHER): Payer: Medicaid Other | Admitting: Vascular Surgery

## 2018-03-22 ENCOUNTER — Ambulatory Visit (HOSPITAL_COMMUNITY)
Admission: RE | Admit: 2018-03-22 | Discharge: 2018-03-22 | Disposition: A | Discharge status: Home / Self Care | Payer: Medicaid Other | Source: Ambulatory Visit | Attending: Vascular Surgery | Admitting: Vascular Surgery

## 2018-03-22 VITALS — BP 94/63 | HR 84 | Temp 97.9°F | Resp 16 | Ht 62.5 in | Wt 172.0 lb

## 2018-03-22 DIAGNOSIS — R609 Edema, unspecified: Secondary | ICD-10-CM | POA: Diagnosis present

## 2018-03-22 DIAGNOSIS — I872 Venous insufficiency (chronic) (peripheral): Secondary | ICD-10-CM

## 2018-03-22 NOTE — Progress Notes (Signed)
Patient name: Susan Guerrero MRN: [id number] DOB: 1976/01/17 Sex: female  REASON FOR CONSULT: Bilateral lower extremely leg swelling  HPI: Susan Guerrero is a 42 y.o. female, with history of asthma that presents for evaluation of bilateral lower extremity leg swelling.  Patient states she has had swelling in both of her legs and her feet particularly worse at the end of the day.  She works [employer] and is on her feet for long periods of time.  She states the only thing that really helps is leg elevation and sometimes takes to 3 days for the swelling to improve.  She has been wearing knee-high compression for about 5 days a week.  In addition she states she has heaviness in both legs.  She denies any specific trauma or DVT to her lower extremity.  She said no vein interventions to her lower extremities.  She was previously seen by Dr. Kellie Simmering 2012 and he recommended compression and consideration for sclera if she wanted to treat some reticular veins.  Past Medical History:  Diagnosis Date  . Asthma   . Varicose veins     History reviewed. No pertinent surgical history.  Family History  Problem Relation Age of Onset  . Hypertension Father   . Migraines Sister   . Asthma Brother   . Obesity Brother   . Cancer Maternal Aunt        ovarian  . Cancer Maternal Grandmother        breast    SOCIAL HISTORY: Social History   Socioeconomic History  . Marital status: Married    Spouse name: Not on file  . Number of children: Not on file  . Years of education: Not on file  . Highest education level: Not on file  Occupational History  . Not on file  Social Needs  . Financial resource strain: Not on file  . Food insecurity:    Worry: Not on file    Inability: Not on file  . Transportation needs:    Medical: Not on file    Non-medical: Not on file  Tobacco Use  . Smoking status: Never Smoker  . Smokeless tobacco: Never Used  Substance and Sexual Activity  . Alcohol use: Yes     Comment: occasionally  . Drug use: No  . Sexual activity: Not on file  Lifestyle  . Physical activity:    Days per week: Not on file    Minutes per session: Not on file  . Stress: Not on file  Relationships  . Social connections:    Talks on phone: Not on file    Gets together: Not on file    Attends religious service: Not on file    Active member of club or organization: Not on file    Attends meetings of clubs or organizations: Not on file    Relationship status: Not on file  . Intimate partner violence:    Fear of current or ex partner: Not on file    Emotionally abused: Not on file    Physically abused: Not on file    Forced sexual activity: Not on file  Other Topics Concern  . Not on file  Social History Narrative  . Not on file    Allergies  Allergen Reactions  . Flagyl [Metronidazole Hcl] Other (See Comments)    Causes stomach pain    Current Outpatient Medications  Medication Sig Dispense Refill  . albuterol (VENTOLIN HFA) 108 (90 BASE) MCG/ACT inhaler Inhale 2 puffs  into the lungs every 4 (four) hours as needed for shortness of breath.     Marland Kitchen ibuprofen (ADVIL,MOTRIN) 800 MG tablet Take 1 tablet (800 mg total) by mouth 3 (three) times daily. 21 tablet 0  . benzonatate (TESSALON) 100 MG capsule Take 1 capsule (100 mg total) by mouth every 8 (eight) hours. (Patient not taking: Reported on 03/22/2018) 21 capsule 0  . ondansetron (ZOFRAN ODT) 4 MG disintegrating tablet Take 1 tablet (4 mg total) by mouth every 8 (eight) hours as needed for nausea or vomiting. (Patient not taking: Reported on 03/22/2018) 20 tablet 0  . predniSONE (DELTASONE) 50 MG tablet Take one tablet PO daily. (Patient not taking: Reported on 03/22/2018) 5 tablet 0   No current facility-administered medications for this visit.     REVIEW OF SYSTEMS:  [X]  denotes positive finding, [ ]  denotes negative finding Cardiac  Comments:  Chest pain or chest pressure:    Shortness of breath upon exertion:     Short of breath when lying flat:    Irregular heart rhythm:        Vascular    Pain in calf, thigh, or hip brought on by ambulation:    Pain in feet at night that wakes you up from your sleep:     Blood clot in your veins:    Leg swelling:  x       Pulmonary    Oxygen at home:    Productive cough:     Wheezing:         Neurologic    Sudden weakness in arms or legs:     Sudden numbness in arms or legs:     Sudden onset of difficulty speaking or slurred speech:    Temporary loss of vision in one eye:     Problems with dizziness:         Gastrointestinal    Blood in stool:     Vomited blood:         Genitourinary    Burning when urinating:     Blood in urine:        Psychiatric    Major depression:         Hematologic    Bleeding problems:    Problems with blood clotting too easily:        Skin    Rashes or ulcers:        Constitutional    Fever or chills:      PHYSICAL EXAM: Vitals:   03/22/18 1414  BP: 94/63  Pulse: 84  Resp: 16  Temp: 97.9 F (36.6 C)  TempSrc: Oral  SpO2: 99%  Weight: 78 kg  Height: 5' 2.5" (1.588 m)    GENERAL: The patient is a well-nourished female, in no acute distress. The vital signs are documented above. CARDIAC: There is a regular rate and rhythm.  VASCULAR:  2+ radial pulse palpable BUE 2+ femoral pulse palpable bilateral groins 2+ DP palpable BLE When standing no overt varicosities, some bilateral swelling at the ankles PULMONARY: There is good air exchange bilaterally without wheezing or rales. ABDOMEN: Soft and non-tender with normal pitched bowel sounds.  MUSCULOSKELETAL: There are no major deformities or cyanosis. NEUROLOGIC: No focal weakness or paresthesias are detected. SKIN: There are no ulcers or rashes noted. PSYCHIATRIC: The patient has a normal affect.  DATA:   I independently reviewed her noninvasive imaging that shows significant reflux times in the left common femoral vein as well as the bilateral  popliteal veins and bilateral saphenofemoral junctions.  Assessment/Plan:  Reviewed with Susan Guerrero her lower extremity venous reflux study.  Given that the majority of her reflux is in her deep system we discussed that we do not typically offer endovenous ablation or other intervention for the deep venous system.  I do think she would benefit from thigh-high compression given that she has only been wearing knee-high compression intermittently.  We will get her sized and fitted today for thigh high compression.  Discussed other conservative measures like leg elevation at the end of the day.  I do not see any immediate role for ablation therapy given that she did not have any significant reflux in her superficial system aside from just the saphenofemoral junction.  Follow-up PRN.     Marty Heck, MD [facility] Office: [phone] Pager: [place]

## 2018-08-15 ENCOUNTER — Encounter (HOSPITAL_COMMUNITY): Payer: Self-pay

## 2018-08-15 ENCOUNTER — Other Ambulatory Visit: Payer: Self-pay

## 2018-08-15 DIAGNOSIS — R2240 Localized swelling, mass and lump, unspecified lower limb: Secondary | ICD-10-CM | POA: Insufficient documentation

## 2018-08-15 DIAGNOSIS — Z5321 Procedure and treatment not carried out due to patient leaving prior to being seen by health care provider: Secondary | ICD-10-CM | POA: Insufficient documentation

## 2018-08-15 NOTE — ED Triage Notes (Signed)
Pt reports that she has bilateral LE swelling, and tightness x 3 days. Than worsens with ambulation, and prolonged sitting.

## 2018-08-16 ENCOUNTER — Other Ambulatory Visit: Payer: Self-pay

## 2018-08-16 ENCOUNTER — Emergency Department (HOSPITAL_COMMUNITY)
Admission: EM | Admit: 2018-08-16 | Discharge: 2018-08-16 | Disposition: A | Discharge status: Home / Self Care | Payer: Medicaid Other | Attending: Emergency Medicine | Admitting: Emergency Medicine

## 2018-08-16 ENCOUNTER — Emergency Department (HOSPITAL_COMMUNITY)
Admission: EM | Admit: 2018-08-16 | Discharge: 2018-08-16 | Disposition: A | Discharge status: Home / Self Care | Payer: Medicaid Other | Source: Home / Self Care | Attending: Emergency Medicine | Admitting: Emergency Medicine

## 2018-08-16 ENCOUNTER — Emergency Department (HOSPITAL_COMMUNITY): Payer: Medicaid Other

## 2018-08-16 ENCOUNTER — Ambulatory Visit (HOSPITAL_BASED_OUTPATIENT_CLINIC_OR_DEPARTMENT_OTHER): Payer: Medicaid Other

## 2018-08-16 ENCOUNTER — Encounter (HOSPITAL_COMMUNITY): Payer: Self-pay

## 2018-08-16 DIAGNOSIS — R609 Edema, unspecified: Secondary | ICD-10-CM | POA: Diagnosis not present

## 2018-08-16 DIAGNOSIS — M7989 Other specified soft tissue disorders: Secondary | ICD-10-CM

## 2018-08-16 DIAGNOSIS — R0602 Shortness of breath: Secondary | ICD-10-CM

## 2018-08-16 LAB — CBC WITH DIFFERENTIAL/PLATELET
ABS IMMATURE GRANULOCYTES: 0.04 10*3/uL (ref 0.00–0.07)
BASOS PCT: 1 %
Basophils Absolute: 0 10*3/uL (ref 0.0–0.1)
EOS PCT: 1 %
Eosinophils Absolute: 0.1 10*3/uL (ref 0.0–0.5)
HCT: 38.9 % (ref 36.0–46.0)
HEMOGLOBIN: 11.8 g/dL — AB (ref 12.0–15.0)
Immature Granulocytes: 1 %
Lymphocytes Relative: 38 %
Lymphs Abs: 3 10*3/uL (ref 0.7–4.0)
MCH: 26.2 pg (ref 26.0–34.0)
MCHC: 30.3 g/dL (ref 30.0–36.0)
MCV: 86.4 fL (ref 80.0–100.0)
MONO ABS: 0.8 10*3/uL (ref 0.1–1.0)
MONOS PCT: 10 %
NEUTROS ABS: 4 10*3/uL (ref 1.7–7.7)
Neutrophils Relative %: 49 %
PLATELETS: 295 10*3/uL (ref 150–400)
RBC: 4.5 MIL/uL (ref 3.87–5.11)
RDW: 14 % (ref 11.5–15.5)
WBC: 8 10*3/uL (ref 4.0–10.5)
nRBC: 0 % (ref 0.0–0.2)

## 2018-08-16 LAB — COMPREHENSIVE METABOLIC PANEL
ALK PHOS: 83 U/L (ref 38–126)
ALT: 29 U/L (ref 0–44)
ANION GAP: 5 (ref 5–15)
AST: 35 U/L (ref 15–41)
Albumin: 3.4 g/dL — ABNORMAL LOW (ref 3.5–5.0)
BUN: 13 mg/dL (ref 6–20)
CALCIUM: 8.2 mg/dL — AB (ref 8.9–10.3)
CO2: 25 mmol/L (ref 22–32)
CREATININE: 0.9 mg/dL (ref 0.44–1.00)
Chloride: 110 mmol/L (ref 98–111)
GFR calc non Af Amer: >60 mL/min (ref >60)
Glucose, Bld: 73 mg/dL (ref 70–99)
Potassium: 3.4 mmol/L — ABNORMAL LOW (ref 3.5–5.1)
Sodium: 140 mmol/L (ref 135–145)
Total Bilirubin: 0.4 mg/dL (ref 0.3–1.2)
Total Protein: 6.3 g/dL — ABNORMAL LOW (ref 6.5–8.1)

## 2018-08-16 LAB — BRAIN NATRIURETIC PEPTIDE: B NATRIURETIC PEPTIDE 5: 27.9 pg/mL (ref 0.0–100.0)

## 2018-08-16 IMAGING — CR DG CHEST 2V
2 series · 2 of 2 positions shown · non-contrast
Comparison: [DATE]

CLINICAL DATA: Short of breath

EXAM:
CHEST - 2 VIEW

[w chest lat]
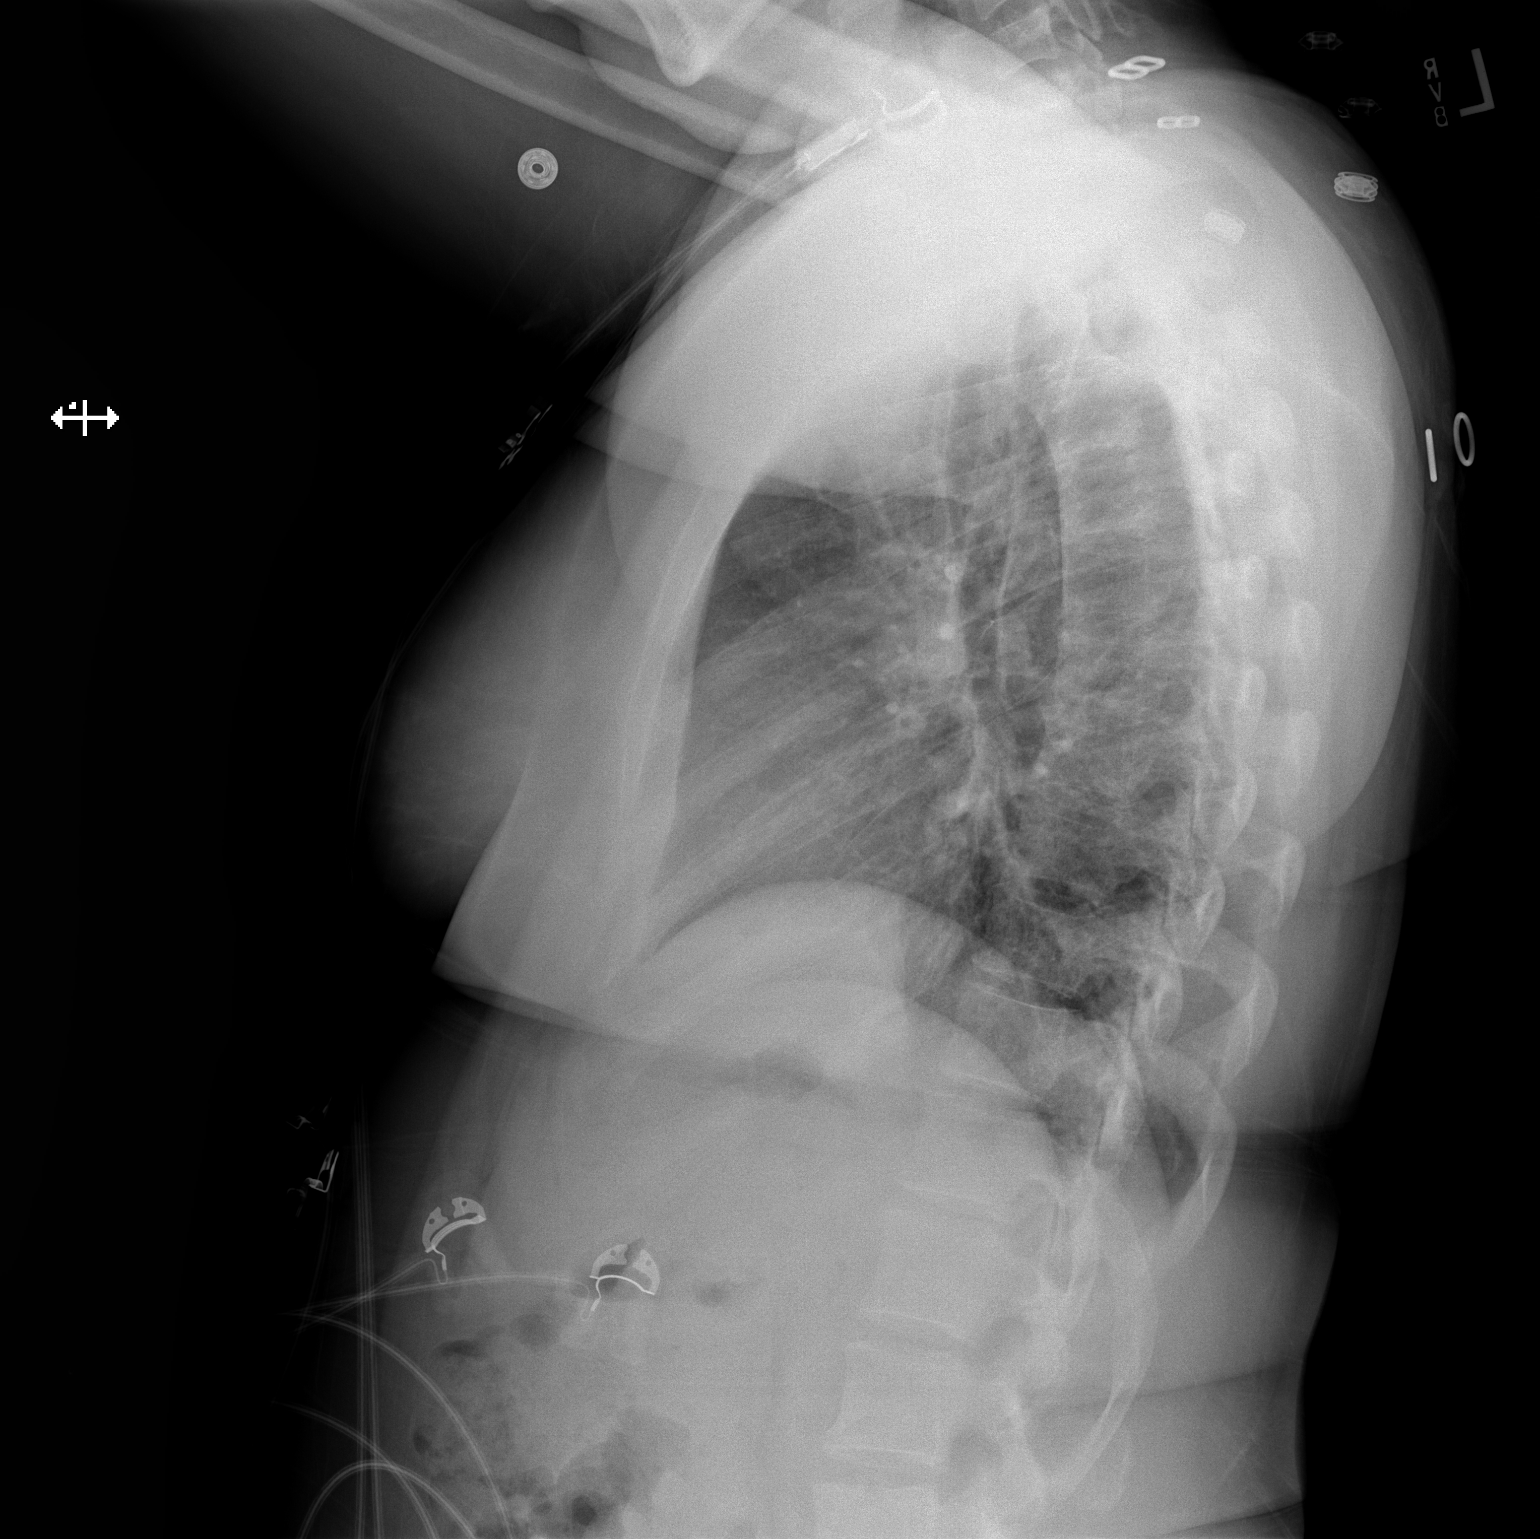

[w chest pa]
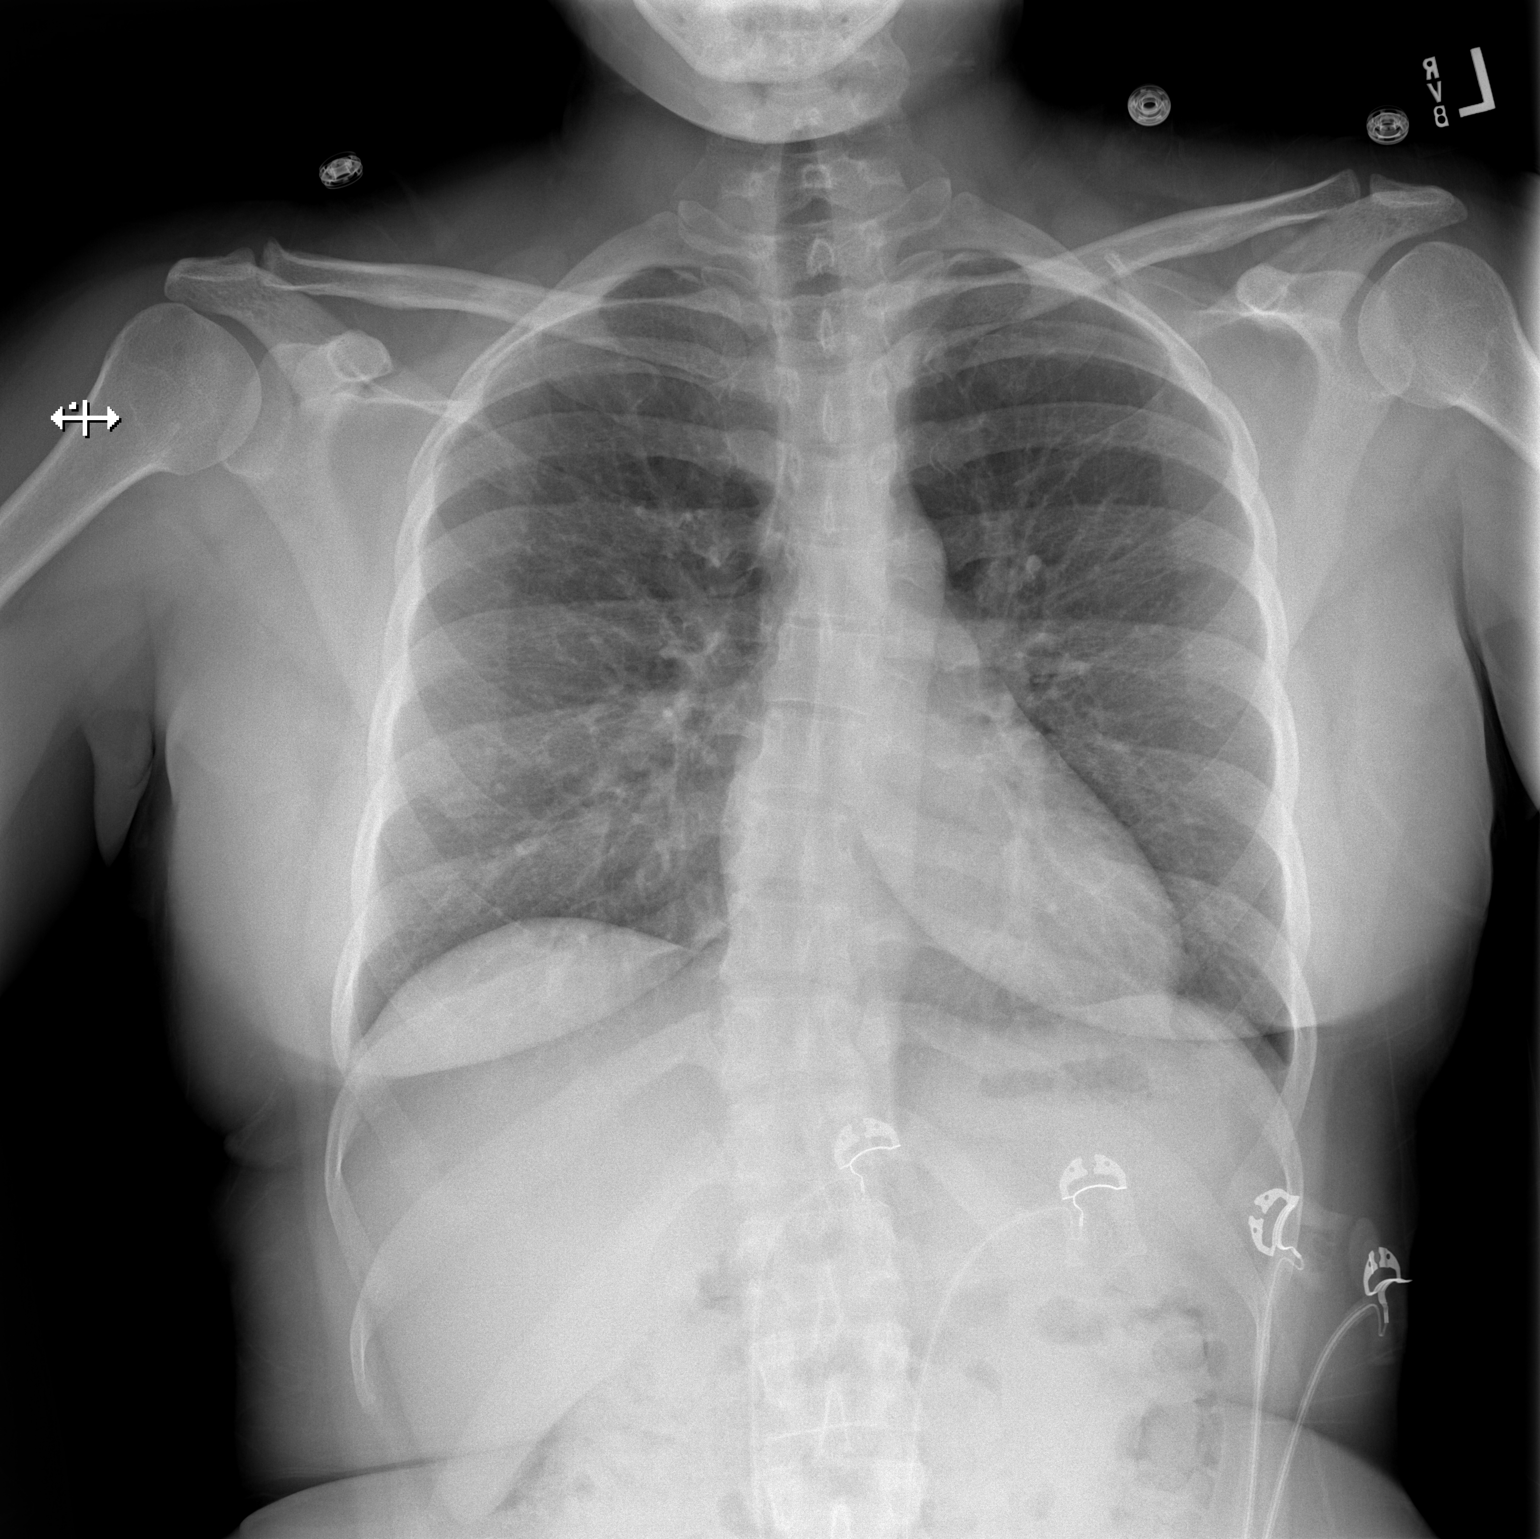

[2 of 2 positions shown; findings below may reference images not displayed]

FINDINGS: Heart size and vascularity normal. Normal mediastinum. Lungs are
well aerated and clear

Mild thoracic dextroscoliosis at approximately T11
IMPRESSION: No active cardiopulmonary disease.

## 2018-08-16 MED ORDER — TRAMADOL HCL 50 MG PO TABS: 50.0000 mg | ORAL_TABLET | Freq: Four times a day (QID) | ORAL | 0 refills | Status: DC | PRN

## 2018-08-16 NOTE — Progress Notes (Signed)
Bilateral lower extremities venous duplex exam completed. Please see preliminary  Notes on CV PROC under chart review. Harjot Zavadil H Samanta Gal(RDMS RVT) 08/16/18 2:55 PM

## 2018-08-16 NOTE — ED Provider Notes (Signed)
[facility] DEPT Provider Note   CSN: [id number] Arrival date & time: 08/16/18  1245    History   Chief Complaint Chief Complaint  Patient presents with  . Leg Swelling    HPI Susan Guerrero is a 43 y.o. female.     The history is provided by the patient and medical records. No language interpreter was used.    Susan Guerrero is a 43 y.o. female  with a PMH of venous insufficiency who presents to the Emergency Department complaining of 4-day history of worsening bilateral lower extremity swelling and pain.  Patient states that she has a history of similar back in September 2019.  She was referred to the vascular doctors who told her there was nothing operative they could do for her and that she needed to wear thigh-high compression stockings daily.  Per chart review, this does appear pretty much accurate, she was seen by them in October.  Does not appear to have any further follow-up plan for her.  She reports doing quite well with low-salt diet and wearing her compression stockings regularly.  Her symptoms improved a great deal.  4 days ago, her symptoms all started coming back again.  She reports similar swelling and tingling to her lower extremities from the mid thigh down.  This time, her legs are hurting and painful.  She did not have any lower extremity pain with her similar episode last year.  The pain is the differing factor and what brought her to the emergency department today.  Pain is worse with ambulation and certain movements.  Denies any alleviating factors.  Reports some central chest pain lasting a few seconds a couple of times yesterday.  None today.  None currently.  Not feeling short of breath today either, but did feel short of breath yesterday intermittently as well.  She denies any OCP or hormone use.  No recent travel/immobilization/surgeries.  Denies history of CHF.  Report family history of heart disease.  She does report orthopnea  for several months now.  She props herself up on one pillow to sleep.  Did not really think much about this, just felt like it was difficult to breathe lying flat, so proper self up and self problem.  No fever or chills.  No back pain.  No saddle anesthesia or bowel/bladder incontinence.   Past Medical History:  Diagnosis Date  . Asthma   . Varicose veins     Patient Active Problem List   Diagnosis Date Noted  . Venous (peripheral) insufficiency 03/22/2018    History reviewed. No pertinent surgical history.   OB History   No obstetric history on file.      Home Medications    Prior to Admission medications   Medication Sig Start Date End Date Taking? Authorizing Provider  albuterol (VENTOLIN HFA) 108 (90 Base) MCG/ACT inhaler Inhale 2 puffs into the lungs every 4 (four) hours as needed for shortness of breath.    Yes [provider]  ibuprofen (ADVIL,MOTRIN) 200 MG tablet Take 400 mg by mouth every 6 (six) hours as needed for headache, moderate pain or cramping.   Yes [provider]  Multiple Vitamin (MULTIVITAMIN WITH MINERALS) TABS tablet Take 1 tablet by mouth daily.   Yes [provider]  Skin Protectants, Misc. (EUCERIN) cream Apply 1 application topically as needed (ezcema).   Yes [provider]  benzonatate (TESSALON) 100 MG capsule Take 1 capsule (100 mg total) by mouth every 8 (eight) hours. Patient  not taking: Reported on 03/22/2018 06/26/17   Ashley Murrain, NP  ibuprofen (ADVIL,MOTRIN) 800 MG tablet Take 1 tablet (800 mg total) by mouth 3 (three) times daily. Patient not taking: Reported on 08/16/2018 06/17/16   Muthersbaugh, Jarrett Soho, PA-C  ondansetron (ZOFRAN ODT) 4 MG disintegrating tablet Take 1 tablet (4 mg total) by mouth every 8 (eight) hours as needed for nausea or vomiting. Patient not taking: Reported on 03/22/2018 06/26/17   Ashley Murrain, NP  predniSONE (DELTASONE) 50 MG tablet Take one tablet PO daily. Patient not taking:  Reported on 03/22/2018 06/26/17   Ashley Murrain, NP  traMADol (ULTRAM) 50 MG tablet Take 1 tablet (50 mg total) by mouth every 6 (six) hours as needed for moderate pain or severe pain. 08/16/18   Baleigh Rennaker, Ozella Almond, PA-C    Family History Family History  Problem Relation Age of Onset  . Hypertension Father   . Migraines Sister   . Asthma Brother   . Obesity Brother   . Cancer Maternal Aunt        ovarian  . Cancer Maternal Grandmother        breast    Social History Social History   Tobacco Use  . Smoking status: Never Smoker  . Smokeless tobacco: Never Used  Substance Use Topics  . Alcohol use: Yes    Comment: Rare   . Drug use: No     Allergies   Flagyl [metronidazole hcl]   Review of Systems Review of Systems  Constitutional: Negative for fever.  HENT: Negative for congestion.   Respiratory: Positive for shortness of breath. Negative for cough and wheezing.   Cardiovascular: Positive for chest pain (Resolved) and leg swelling. Negative for palpitations.  Gastrointestinal: Negative for abdominal pain, nausea and vomiting.  Skin: Negative for color change and wound.  Neurological: Positive for numbness. Negative for weakness.  All other systems reviewed and are negative.    Physical Exam Updated Vital Signs BP 104/70   Pulse 66   Temp 98.9 F (37.2 C) (Oral)   Resp 20   Wt 79.4 kg   LMP 08/08/2018   SpO2 100%   BMI 32.01 kg/m   Physical Exam Vitals signs and nursing note reviewed.  Constitutional:      General: She is not in acute distress.    Appearance: She is well-developed.  HENT:     Head: Normocephalic and atraumatic.  Neck:     Musculoskeletal: Neck supple.  Cardiovascular:     Rate and Rhythm: Normal rate and regular rhythm.     Heart sounds: Normal heart sounds. No murmur.  Pulmonary:     Effort: Pulmonary effort is normal. No respiratory distress.     Comments: Lungs are clear to auscultation bilaterally. Abdominal:     General:  There is no distension.     Palpations: Abdomen is soft.     Tenderness: There is no abdominal tenderness.  Musculoskeletal:     Comments: Bilateral lower extremities with 1+ edema and diffuse tenderness to lower extremities from the knee down.  No open wounds.  No erythema or warmth.  2+ CP.  Sensation is intact and equal bilaterally.  Good cap refill of the digits.  No tenderness to the C/T/L-spine.  Skin:    General: Skin is warm and dry.  Neurological:     Mental Status: She is alert and oriented to person, place, and time.      ED Treatments / Results  Labs (all labs ordered  are listed, but only abnormal results are displayed) Labs Reviewed  CBC WITH DIFFERENTIAL/PLATELET - Abnormal; Notable for the following components:      Result Value   Hemoglobin 11.8 (*)    All other components within normal limits  COMPREHENSIVE METABOLIC PANEL - Abnormal; Notable for the following components:   Potassium 3.4 (*)    Calcium 8.2 (*)    Total Protein 6.3 (*)    Albumin 3.4 (*)    All other components within normal limits  BRAIN NATRIURETIC PEPTIDE    EKG EKG Interpretation  Date/Time:  Tuesday August 16 2018 14:55:27 EST Ventricular Rate:  69 PR Interval:    QRS Duration: 85 QT Interval:  390 QTC Calculation: 418 R Axis:   71 Text Interpretation:  Sinus rhythm nonspecific ST changes similar to Jan 2016 Confirmed by Sherwood Gambler [phone]) on 08/16/2018 3:01:54 PM   Radiology Dg Chest 2 View  Result Date: 08/16/2018 CLINICAL DATA:  Short of breath EXAM: CHEST - 2 VIEW COMPARISON:  06/26/2017 FINDINGS: Heart size and vascularity normal. Normal mediastinum. Lungs are well aerated and clear Mild thoracic dextroscoliosis at approximately T11 IMPRESSION: No active cardiopulmonary disease. Electronically Signed   By: Franchot Gallo M.D.   On: 08/16/2018 15:20   Vas [place] Lower Extremity Venous (dvt) (only [facility])  Result Date: 08/16/2018  Lower Venous Study Indications: Edema. Other  Indications: History of venous insufficiency. Limitations: Body habitus. Comparison Study: No related prior study available. Performing Technologist: Rudell Cobb  Examination Guidelines: A complete evaluation includes B-mode imaging, spectral Doppler, color Doppler, and power Doppler as needed of all accessible portions of each vessel. Bilateral testing is considered an integral part of a complete examination. Limited examinations for reoccurring indications may be performed as noted.  Right Venous Findings: +---------+---------------+---------+-----------+----------+-------------------+          CompressibilityPhasicitySpontaneityPropertiesSummary             +---------+---------------+---------+-----------+----------+-------------------+ CFV      Full           Yes      Yes                                      +---------+---------------+---------+-----------+----------+-------------------+ SFJ      Full                                                             +---------+---------------+---------+-----------+----------+-------------------+ FV Prox  Full                                                             +---------+---------------+---------+-----------+----------+-------------------+ FV Mid   Full                                                             +---------+---------------+---------+-----------+----------+-------------------+ FV DistalFull                                                             +---------+---------------+---------+-----------+----------+-------------------+  PFV      Full                                                             +---------+---------------+---------+-----------+----------+-------------------+ POP      Full           Yes      Yes                                      +---------+---------------+---------+-----------+----------+-------------------+ PTV                              Yes                   not well visualized                                                       with compression    +---------+---------------+---------+-----------+----------+-------------------+ PERO                             Yes                  not well visualized                                                       with compression    +---------+---------------+---------+-----------+----------+-------------------+  Left Venous Findings: +---------+---------------+---------+-----------+----------+-------------------+          CompressibilityPhasicitySpontaneityPropertiesSummary             +---------+---------------+---------+-----------+----------+-------------------+ CFV      Full           Yes      Yes                                      +---------+---------------+---------+-----------+----------+-------------------+ SFJ      Full                                                             +---------+---------------+---------+-----------+----------+-------------------+ FV Prox  Full                                                             +---------+---------------+---------+-----------+----------+-------------------+ FV Mid   Full                                                             +---------+---------------+---------+-----------+----------+-------------------+  FV DistalFull                                                             +---------+---------------+---------+-----------+----------+-------------------+ PFV      Full                                                             +---------+---------------+---------+-----------+----------+-------------------+ POP      Full           Yes      Yes                                      +---------+---------------+---------+-----------+----------+-------------------+ PTV                              Yes                  not well visualized                                                        with compression    +---------+---------------+---------+-----------+----------+-------------------+ PERO                             Yes                  not well visualized                                                       with compression    +---------+---------------+---------+-----------+----------+-------------------+    Summary: Right: There is no evidence of deep vein thrombosis in the lower extremity. However, portions of this examination were limited- see technologist comments above. No cystic structure found in the popliteal fossa. Left: There is no evidence of deep vein thrombosis in the lower extremity. However, portions of this examination were limited- see technologist comments above. No cystic structure found in the popliteal fossa.  *See table(s) above for measurements and observations. Electronically signed by Deitra Mayo MD on 08/16/2018 at 3:53:20 PM.    Final     Procedures Procedures (including critical care time)  Medications Ordered in ED Medications - No data to display   Initial Impression / Assessment and Plan / ED Course  I have reviewed the triage vital signs and the nursing notes.  Pertinent labs & imaging results that were available during my care of the patient were reviewed by me and considered in my medical decision making (see chart for details).       Susan Guerrero is a 43 y.o. female who presents to ED for bilateral lower  extremity swelling and pain for the last 4 days.  Has a history of similar flareup last year around September/October 2019 which she reports improved with dietary changes and compression hose.  She was seen by vascular and told this was due to venous insufficiency and there was nothing further they could intervene with and did not recommend any follow-up appointments per patient.  On exam today, she has mild edema present bilaterally, but does appear quite tender to palpation diffusely.  No  erythema or warmth to suggest infectious etiology.  Given the calf tenderness noted, lower extremity ultrasounds were obtained and were negative for DVT.  Labs reviewed and reassuring.  Normal liver enzymes.  BNP normal.  No evidence of CHF on chest x-ray either.  Thus far, work-up is quite reassuring. Evaluation does not show pathology that would require ongoing emergent intervention or inpatient treatment.  Will have her follow-up with her PCP for further management of likely chronic venous insufficiency.  Reasons to return to the emergency department were discussed and all questions answered.  Final Clinical Impressions(s) / ED Diagnoses   Final diagnoses:  Shortness of breath  Leg swelling    ED Discharge Orders         Ordered    traMADol (ULTRAM) 50 MG tablet  Every 6 hours PRN     08/16/18 1646           Bracy Pepper, Ozella Almond, PA-C 08/16/18 1650    Sherwood Gambler, MD 08/29/18 [phone]

## 2018-08-16 NOTE — Discharge Instructions (Addendum)
It was my pleasure taking care of you today!   Tylenol or ibuprofen as needed for mild to moderate pain.  I have provided you a very short course of stronger pain medication to take only as needed for severe pain - This can make you very drowsy - please do not drink alcohol, operate heavy machinery or drive on this medication.   It is very important that you follow-up with your primary care doctor.  Please give them a call first thing tomorrow to schedule this appointment.  Return to the emergency department for new or worsening symptoms, any additional concerns.

## 2018-08-16 NOTE — ED Triage Notes (Addendum)
Pt arrives via POV from home. Pt reports that she has bilateral leg swelling and pain. Pt denies injury or trauma. Pt reports bilateral numbness to feet. Pt reports this has happened in the past and was diagnosed with venous reflux and told to wear compression stockings. Pt reports that she has been wearing stockings.

## 2018-09-03 ENCOUNTER — Other Ambulatory Visit: Payer: Self-pay

## 2018-09-03 ENCOUNTER — Emergency Department (HOSPITAL_COMMUNITY)
Admission: EM | Admit: 2018-09-03 | Discharge: 2018-09-03 | Disposition: A | Discharge status: Home / Self Care | Payer: Medicaid Other | Attending: Emergency Medicine | Admitting: Emergency Medicine

## 2018-09-03 ENCOUNTER — Encounter (HOSPITAL_COMMUNITY): Payer: Self-pay

## 2018-09-03 DIAGNOSIS — Z79899 Other long term (current) drug therapy: Secondary | ICD-10-CM | POA: Insufficient documentation

## 2018-09-03 DIAGNOSIS — R51 Headache: Secondary | ICD-10-CM | POA: Diagnosis present

## 2018-09-03 DIAGNOSIS — E119 Type 2 diabetes mellitus without complications: Secondary | ICD-10-CM | POA: Insufficient documentation

## 2018-09-03 DIAGNOSIS — J45909 Unspecified asthma, uncomplicated: Secondary | ICD-10-CM | POA: Diagnosis not present

## 2018-09-03 DIAGNOSIS — G43801 Other migraine, not intractable, with status migrainosus: Secondary | ICD-10-CM

## 2018-09-03 LAB — BASIC METABOLIC PANEL
Anion gap: 6 (ref 5–15)
BUN: 9 mg/dL (ref 6–20)
CO2: 25 mmol/L (ref 22–32)
Calcium: 7.9 mg/dL — ABNORMAL LOW (ref 8.9–10.3)
Chloride: 108 mmol/L (ref 98–111)
Creatinine, Ser: 0.93 mg/dL (ref 0.44–1.00)
GFR calc Af Amer: >60 mL/min (ref >60)
Glucose, Bld: 116 mg/dL — ABNORMAL HIGH (ref 70–99)
Potassium: 3.4 mmol/L — ABNORMAL LOW (ref 3.5–5.1)
Sodium: 139 mmol/L (ref 135–145)

## 2018-09-03 LAB — CBC
HCT: 41.3 % (ref 36.0–46.0)
Hemoglobin: 12.7 g/dL (ref 12.0–15.0)
MCH: 25.9 pg — ABNORMAL LOW (ref 26.0–34.0)
MCHC: 30.8 g/dL (ref 30.0–36.0)
MCV: 84.3 fL (ref 80.0–100.0)
PLATELETS: 287 10*3/uL (ref 150–400)
RBC: 4.9 MIL/uL (ref 3.87–5.11)
RDW: 13.8 % (ref 11.5–15.5)
WBC: 10 10*3/uL (ref 4.0–10.5)
nRBC: 0 % (ref 0.0–0.2)

## 2018-09-03 LAB — I-STAT BETA HCG BLOOD, ED (MC, WL, AP ONLY): I-stat hCG, quantitative: <5 m[IU]/mL (ref <5)

## 2018-09-03 LAB — CBG MONITORING, ED: Glucose-Capillary: 145 mg/dL — ABNORMAL HIGH (ref 70–99)

## 2018-09-03 MED ORDER — DEXAMETHASONE SODIUM PHOSPHATE 10 MG/ML IJ SOLN
10.0000 mg | Freq: Once | INTRAMUSCULAR | Status: AC
  Administered 2018-09-03: 10 mg via INTRAVENOUS
  Filled 2018-09-03: qty 1

## 2018-09-03 MED ORDER — METOCLOPRAMIDE HCL 5 MG/ML IJ SOLN
10.0000 mg | Freq: Once | INTRAMUSCULAR | Status: AC
  Administered 2018-09-03: 10 mg via INTRAVENOUS
  Filled 2018-09-03: qty 2

## 2018-09-03 MED ORDER — SODIUM CHLORIDE 0.9 % IV BOLUS
1000.0000 mL | Freq: Once | INTRAVENOUS | Status: AC
  Administered 2018-09-03: 1000 mL via INTRAVENOUS

## 2018-09-03 MED ORDER — SODIUM CHLORIDE 0.9 % IV SOLN
INTRAVENOUS | Status: DC
  Administered 2018-09-03: 14:00:00 via INTRAVENOUS

## 2018-09-03 MED ORDER — DIPHENHYDRAMINE HCL 50 MG/ML IJ SOLN
25.0000 mg | Freq: Once | INTRAMUSCULAR | Status: AC
  Administered 2018-09-03: 25 mg via INTRAVENOUS
  Filled 2018-09-03: qty 1

## 2018-09-03 NOTE — ED Notes (Signed)
Pt states "headache is fine". No other symptoms present at this time.

## 2018-09-03 NOTE — ED Triage Notes (Signed)
Pt states that she has had a bad headache since yesterday. Pt states that she was recently dx with diabetes and high cholesterol, but never received her medicine.

## 2018-09-03 NOTE — Discharge Instructions (Addendum)
Home and rest for 24 hours.  Long-acting medication for the migraine should abated over the next several hours or certainly by tomorrow.  Make appointment follow-up with your doctors for any new or worse symptoms.  Symptoms consistent with migraine headache.  You were given a migraine cocktail here today of Decadron Reglan and Benadryl and IV fluids.

## 2018-09-03 NOTE — ED Notes (Signed)
CBG 145  

## 2018-09-03 NOTE — ED Provider Notes (Signed)
[facility] DEPT Provider Note   CSN: [id number] Arrival date & time: 09/03/18  1134    History   Chief Complaint Chief Complaint  Patient presents with  . Headache    HPI Susan Guerrero is a 43 y.o. female.     Patient with history of migraines.  Symptoms started at 5 in the morning yesterday.  With pain behind both eyes visual changes blurred vision and sensitivity to the light.  And feeling lightheaded.  No nausea or vomiting.  Patient had similar symptoms in March 2018 with negative head CTs and was determined at least clinically that may be migraine related.  Patient has had similar headaches in the past as well.  No injury.  No fevers.     Past Medical History:  Diagnosis Date  . Asthma   . Varicose veins     Patient Active Problem List   Diagnosis Date Noted  . Venous (peripheral) insufficiency 03/22/2018    History reviewed. No pertinent surgical history.   OB History   No obstetric history on file.      Home Medications    Prior to Admission medications   Medication Sig Start Date End Date Taking? Authorizing Provider  albuterol (VENTOLIN HFA) 108 (90 Base) MCG/ACT inhaler Inhale 2 puffs into the lungs every 4 (four) hours as needed for shortness of breath.    Yes [provider]  cetirizine (ZYRTEC) 10 MG tablet Take 10 mg by mouth daily.  08/24/18  Yes [provider]  furosemide (LASIX) 20 MG tablet Take 10 mg by mouth daily.  08/24/18  Yes [provider]  gabapentin (NEURONTIN) 300 MG capsule Take 300 mg by mouth at bedtime.  08/24/18  Yes [provider]  ibuprofen (ADVIL,MOTRIN) 200 MG tablet Take 400 mg by mouth every 6 (six) hours as needed for headache, moderate pain or cramping.   Yes [provider]  Multiple Vitamin (MULTIVITAMIN WITH MINERALS) TABS tablet Take 1 tablet by mouth daily.   Yes [provider]  traMADol (ULTRAM) 50 MG tablet Take 1 tablet (50  mg total) by mouth every 6 (six) hours as needed for moderate pain or severe pain. 08/16/18  Yes Ward, Ozella Almond, PA-C  benzonatate (TESSALON) 100 MG capsule Take 1 capsule (100 mg total) by mouth every 8 (eight) hours. Patient not taking: Reported on 03/22/2018 06/26/17   Ashley Murrain, NP  ibuprofen (ADVIL,MOTRIN) 800 MG tablet Take 1 tablet (800 mg total) by mouth 3 (three) times daily. Patient not taking: Reported on 08/16/2018 06/17/16   Muthersbaugh, Jarrett Soho, PA-C  ondansetron (ZOFRAN ODT) 4 MG disintegrating tablet Take 1 tablet (4 mg total) by mouth every 8 (eight) hours as needed for nausea or vomiting. Patient not taking: Reported on 03/22/2018 06/26/17   Ashley Murrain, NP  predniSONE (DELTASONE) 50 MG tablet Take one tablet PO daily. Patient not taking: Reported on 03/22/2018 06/26/17   Ashley Murrain, NP  Skin Protectants, Misc. (EUCERIN) cream Apply 1 application topically as needed (ezcema).    [provider]    Family History Family History  Problem Relation Age of Onset  . Hypertension Father   . Migraines Sister   . Asthma Brother   . Obesity Brother   . Cancer Maternal Aunt        ovarian  . Cancer Maternal Grandmother        breast    Social History Social History   Tobacco Use  . Smoking status:  Never Smoker  . Smokeless tobacco: Never Used  Substance Use Topics  . Alcohol use: Yes    Comment: Rare   . Drug use: No     Allergies   Flagyl [metronidazole hcl]   Review of Systems Review of Systems  Constitutional: Negative for chills and fever.  HENT: Negative for rhinorrhea and sore throat.   Eyes: Positive for photophobia and visual disturbance.  Respiratory: Negative for cough and shortness of breath.   Cardiovascular: Negative for chest pain and leg swelling.  Gastrointestinal: Negative for abdominal pain, diarrhea, nausea and vomiting.  Genitourinary: Negative for dysuria.  Musculoskeletal: Negative for back pain and neck pain.  Skin: Negative  for rash.  Neurological: Positive for light-headedness and headaches. Negative for dizziness.  Hematological: Does not bruise/bleed easily.  Psychiatric/Behavioral: Negative for confusion.     Physical Exam Updated Vital Signs BP 101/68   Pulse 94   Temp 98.1 F (36.7 C) (Oral)   Resp 12   Ht 1.575 m (5\' 2" )   Wt 80 kg   LMP 08/08/2018   SpO2 100%   BMI 32.26 kg/m   Physical Exam Vitals signs and nursing note reviewed.  Constitutional:      General: She is not in acute distress.    Appearance: Normal appearance. She is well-developed.  HENT:     Head: Normocephalic and atraumatic.     Mouth/Throat:     Mouth: Mucous membranes are moist.  Eyes:     Extraocular Movements: Extraocular movements intact.     Conjunctiva/sclera: Conjunctivae normal.     Pupils: Pupils are equal, round, and reactive to light.  Neck:     Musculoskeletal: Normal range of motion and neck supple. No neck rigidity.  Cardiovascular:     Rate and Rhythm: Normal rate and regular rhythm.     Heart sounds: Normal heart sounds. No murmur.  Pulmonary:     Effort: Pulmonary effort is normal. No respiratory distress.     Breath sounds: Normal breath sounds.  Abdominal:     General: Bowel sounds are normal.     Palpations: Abdomen is soft.     Tenderness: There is no abdominal tenderness.  Musculoskeletal: Normal range of motion.  Skin:    General: Skin is warm and dry.     Capillary Refill: Capillary refill takes less than 2 seconds.  Neurological:     General: No focal deficit present.     Mental Status: She is alert and oriented to person, place, and time.     Cranial Nerves: No cranial nerve deficit.     Sensory: No sensory deficit.     Motor: No weakness.     Coordination: Coordination normal.      ED Treatments / Results  Labs (all labs ordered are listed, but only abnormal results are displayed) Labs Reviewed  CBC - Abnormal; Notable for the following components:      Result Value    MCH 25.9 (*)    All other components within normal limits  BASIC METABOLIC PANEL - Abnormal; Notable for the following components:   Potassium 3.4 (*)    Glucose, Bld 116 (*)    Calcium 7.9 (*)    All other components within normal limits  CBG MONITORING, ED - Abnormal; Notable for the following components:   Glucose-Capillary 145 (*)    All other components within normal limits  I-STAT BETA HCG BLOOD, ED ([facility], [facility], [facility] ONLY)    EKG None  Radiology No results  found.  Procedures Procedures (including critical care time)  Medications Ordered in ED Medications  0.9 %  sodium chloride infusion ( Intravenous New Bag/Given 09/03/18 1340)  sodium chloride 0.9 % bolus 1,000 mL (1,000 mLs Intravenous New Bag/Given 09/03/18 1341)  dexamethasone (DECADRON) injection 10 mg (10 mg Intravenous Given 09/03/18 1346)  metoCLOPramide (REGLAN) injection 10 mg (10 mg Intravenous Given 09/03/18 1347)  diphenhydrAMINE (BENADRYL) injection 25 mg (25 mg Intravenous Given 09/03/18 1346)     Initial Impression / Assessment and Plan / ED Course  I have reviewed the triage vital signs and the nursing notes.  Pertinent labs & imaging results that were available during my care of the patient were reviewed by me and considered in my medical decision making (see chart for details).        Patient with headache onset at 5 in the morning yesterday consistent with past severe headaches that have been called migraines.  Headache is behind both eyes.  Patient nontoxic no acute distress.  Improving significantly here with migraine cocktail.  Patient received Decadron Benadryl and Reglan IV as well as IV fluids.  Did not repeat head CT because she had negative head CT in March 2018 with exact similar presentation.  Labs without any significant abnormalities.  Patient has a new diagnosis of diabetes which she is having outpatient follow-up for.  Blood sugar here today was 145.  Final Clinical Impressions(s) / ED  Diagnoses   Final diagnoses:  Other migraine with status migrainosus, not intractable    ED Discharge Orders    None       Fredia Sorrow, MD 09/03/18 [phone]

## 2018-09-03 NOTE — ED Notes (Signed)
Pt ambulatory to restroom, will provide UA.

## 2018-12-13 ENCOUNTER — Other Ambulatory Visit: Payer: Self-pay

## 2018-12-13 DIAGNOSIS — I872 Venous insufficiency (chronic) (peripheral): Secondary | ICD-10-CM

## 2019-01-10 ENCOUNTER — Encounter: Payer: Self-pay | Admitting: Vascular Surgery

## 2019-01-10 ENCOUNTER — Other Ambulatory Visit: Payer: Self-pay

## 2019-01-10 ENCOUNTER — Ambulatory Visit (INDEPENDENT_AMBULATORY_CARE_PROVIDER_SITE_OTHER): Payer: Medicaid Other | Admitting: Vascular Surgery

## 2019-01-10 ENCOUNTER — Ambulatory Visit (HOSPITAL_COMMUNITY)
Admission: RE | Admit: 2019-01-10 | Discharge: 2019-01-10 | Disposition: A | Discharge status: Home / Self Care | Payer: Medicaid Other | Source: Ambulatory Visit | Attending: Vascular Surgery | Admitting: Vascular Surgery

## 2019-01-10 VITALS — BP 124/79 | HR 79 | Temp 98.1°F | Resp 14 | Ht 62.5 in | Wt 182.0 lb

## 2019-01-10 DIAGNOSIS — I872 Venous insufficiency (chronic) (peripheral): Secondary | ICD-10-CM

## 2019-01-10 NOTE — Progress Notes (Signed)
Patient name: Susan Guerrero MRN: [id number] DOB: 1975/12/18 Sex: female  REASON FOR CONSULT: Bilateral lower extremely leg swelling  HPI: Susan Guerrero is a 43 y.o. female, with history of asthma that presents for re-evaluation of bilateral lower extremity leg swelling.  Patient states she has had swelling in both of her legs and her feet particularly worse at the end of the day.  She works [employer], has been wearing compression, states not on her feet as much recently at work.  When I last saw her last year she had evidence of deep reflux in the common femoral and popliteal veins.  We recommended thigh-high compression which she states she has been wearing.  She still having pain that radiates down from her hips into both legs but then also describes this patch of veins behind her left calf just below the knee is very painful.  Her PCP wanted her to come back and be evaluated.  Past Medical History:  Diagnosis Date  . Asthma   . Varicose veins     History reviewed. No pertinent surgical history.  Family History  Problem Relation Age of Onset  . Hypertension Father   . Migraines Sister   . Asthma Brother   . Obesity Brother   . Cancer Maternal Aunt        ovarian  . Cancer Maternal Grandmother        breast    SOCIAL HISTORY: Social History   Socioeconomic History  . Marital status: Married    Spouse name: Not on file  . Number of children: Not on file  . Years of education: Not on file  . Highest education level: Not on file  Occupational History  . Not on file  Social Needs  . Financial resource strain: Not on file  . Food insecurity    Worry: Not on file    Inability: Not on file  . Transportation needs    Medical: Not on file    Non-medical: Not on file  Tobacco Use  . Smoking status: Never Smoker  . Smokeless tobacco: Never Used  Substance and Sexual Activity  . Alcohol use: Yes    Comment: Rare   . Drug use: No  . Sexual activity: Yes   Lifestyle  . Physical activity    Days per week: Not on file    Minutes per session: Not on file  . Stress: Not on file  Relationships  . Social Herbalist on phone: Not on file    Gets together: Not on file    Attends religious service: Not on file    Active member of club or organization: Not on file    Attends meetings of clubs or organizations: Not on file    Relationship status: Not on file  . Intimate partner violence    Fear of current or ex partner: Not on file    Emotionally abused: Not on file    Physically abused: Not on file    Forced sexual activity: Not on file  Other Topics Concern  . Not on file  Social History Narrative  . Not on file    Allergies  Allergen Reactions  . Flagyl [Metronidazole Hcl] Other (See Comments)    Causes stomach pain    Current Outpatient Medications  Medication Sig Dispense Refill  . albuterol (VENTOLIN HFA) 108 (90 Base) MCG/ACT inhaler Inhale 2 puffs into the lungs every 4 (four) hours as needed for shortness of breath.     Marland Kitchen  atorvastatin (LIPITOR) 10 MG tablet TK 1 T PO ONCE D HS    . cetirizine (ZYRTEC) 10 MG tablet Take 10 mg by mouth daily.     . furosemide (LASIX) 20 MG tablet Take 10 mg by mouth daily.     Marland Kitchen gabapentin (NEURONTIN) 300 MG capsule Take 300 mg by mouth at bedtime.     Marland Kitchen ibuprofen (ADVIL,MOTRIN) 200 MG tablet Take 400 mg by mouth every 6 (six) hours as needed for headache, moderate pain or cramping.    . Multiple Vitamin (MULTIVITAMIN WITH MINERALS) TABS tablet Take 1 tablet by mouth daily.    . Skin Protectants, Misc. (EUCERIN) cream Apply 1 application topically as needed (ezcema).    . benzonatate (TESSALON) 100 MG capsule Take 1 capsule (100 mg total) by mouth every 8 (eight) hours. (Patient not taking: Reported on 01/10/2019) 21 capsule 0  . ibuprofen (ADVIL,MOTRIN) 800 MG tablet Take 1 tablet (800 mg total) by mouth 3 (three) times daily. (Patient not taking: Reported on 01/10/2019) 21 tablet 0  .  ondansetron (ZOFRAN ODT) 4 MG disintegrating tablet Take 1 tablet (4 mg total) by mouth every 8 (eight) hours as needed for nausea or vomiting. (Patient not taking: Reported on 01/10/2019) 20 tablet 0  . predniSONE (DELTASONE) 50 MG tablet Take one tablet PO daily. (Patient not taking: Reported on 01/10/2019) 5 tablet 0  . traMADol (ULTRAM) 50 MG tablet Take 1 tablet (50 mg total) by mouth every 6 (six) hours as needed for moderate pain or severe pain. (Patient not taking: Reported on 01/10/2019) 8 tablet 0   No current facility-administered medications for this visit.     REVIEW OF SYSTEMS:  [X]  denotes positive finding, [ ]  denotes negative finding Cardiac  Comments:  Chest pain or chest pressure:    Shortness of breath upon exertion:    Short of breath when lying flat:    Irregular heart rhythm:        Vascular    Pain in calf, thigh, or hip brought on by ambulation:    Pain in feet at night that wakes you up from your sleep:     Blood clot in your veins:    Leg swelling:  x       Pulmonary    Oxygen at home:    Productive cough:     Wheezing:         Neurologic    Sudden weakness in arms or legs:     Sudden numbness in arms or legs:     Sudden onset of difficulty speaking or slurred speech:    Temporary loss of vision in one eye:     Problems with dizziness:         Gastrointestinal    Blood in stool:     Vomited blood:         Genitourinary    Burning when urinating:     Blood in urine:        Psychiatric    Major depression:         Hematologic    Bleeding problems:    Problems with blood clotting too easily:        Skin    Rashes or ulcers:        Constitutional    Fever or chills:      PHYSICAL EXAM: Vitals:   01/10/19 1512  BP: 124/79  Pulse: 79  Resp: 14  Temp: 98.1 F (36.7 C)  TempSrc: Temporal  SpO2: 98%  Weight: 182 lb (82.6 kg)  Height: 5' 2.5" (1.588 m)    GENERAL: The patient is a well-nourished female, in no acute distress. The vital  signs are documented above. CARDIAC: There is a regular rate and rhythm.  VASCULAR:  2+ radial pulse palpable BUE 2+ femoral pulse palpable bilateral groins 2+ DP palpable BLE When standing no overt varicosities, some bilateral swelling at the ankles Reticular vein patch behind left knee PULMONARY: There is good air exchange bilaterally without wheezing or rales. ABDOMEN: Soft and non-tender with normal pitched bowel sounds.  MUSCULOSKELETAL: There are no major deformities or cyanosis.   DATA:   No evidence of superficial reflux on evaluation today.  In fact her deep reflux was not documented today either but she did have evidence of this in the past.  Assessment/Plan:  43 year old female who presents for reevaluation in the setting of lower extremity venous reflux.  Her previous reflux study last year demonstrated deep reflux in the common femoral and popliteal veins.  We discussed that given she had deep reflux and not superficial reflux we would recommend conservative measures with thigh-high compression leg elevation etc.  Now she is describing this patch of reticular veins behind her left knee that is very painful.  She has no evidence of superficial reflux on her reflux study today and as a result would not qualify for any ablation of truncal reflux.  Discussed that the option here would be sclerotherapy for this patch of reticular veins.  Unfortunately given no superficial truncal reflux this will be an out-of-pocket expense which I explained to her.  She will call back to schedule if needed.   Marty Heck, MD [facility] Office: [phone] Pager: [place]

## 2019-12-01 ENCOUNTER — Encounter (HOSPITAL_COMMUNITY): Payer: Self-pay | Admitting: Emergency Medicine

## 2019-12-01 ENCOUNTER — Ambulatory Visit (HOSPITAL_COMMUNITY)
Admission: EM | Admit: 2019-12-01 | Discharge: 2019-12-01 | Disposition: A | Discharge status: Home / Self Care | Payer: Medicaid Other | Attending: Family Medicine | Admitting: Family Medicine

## 2019-12-01 ENCOUNTER — Other Ambulatory Visit: Payer: Self-pay

## 2019-12-01 DIAGNOSIS — R202 Paresthesia of skin: Secondary | ICD-10-CM | POA: Diagnosis not present

## 2019-12-01 HISTORY — DX: Type 2 diabetes mellitus without complications: E11.9

## 2019-12-01 MED ORDER — KETOROLAC TROMETHAMINE 60 MG/2ML IM SOLN
60.0000 mg | Freq: Once | INTRAMUSCULAR | Status: AC
  Administered 2019-12-01: 60 mg via INTRAMUSCULAR

## 2019-12-01 MED ORDER — KETOROLAC TROMETHAMINE 60 MG/2ML IM SOLN
INTRAMUSCULAR | Status: AC
  Filled 2019-12-01: qty 2

## 2019-12-01 NOTE — ED Triage Notes (Signed)
Pt here for numbness and tingling from bilateral mid thigh x 3 days with some pain and some blurry vision today; pt sts she is diabetic and here sugar was 240

## 2019-12-02 NOTE — ED Provider Notes (Signed)
[facility]   [id number] 12/01/19 Arrival Time: 4098  ASSESSMENT & PLAN:  1. Paresthesias     Likely related to uncontrolled DM. Discussed. Plans to est care as below.   Meds ordered this encounter  Medications   ketorolac (TORADOL) injection 60 mg     Recommend:  Follow-up Information    Schedule an appointment as soon as possible for a visit  with [facility].   Contact information: [address] [place] [address]               Reviewed expectations re: course of current medical issues. Questions answered. Outlined signs and symptoms indicating need for more acute intervention. Patient verbalized understanding. After Visit Summary given.  SUBJECTIVE: History from: patient. Sherin Vink is a 44 y.o. female who reports numb/tingling feeling; bilateral legs mid thigh to feet; unsure of duration; worse past several days; occas blurry vision. Blood sugar 240 this morning; taking meds as directed. Ambulatory without difficulty. Normal bowel and bladder habits. No extremity weakness.     OBJECTIVE:  Vitals:   12/01/19 1700  BP: 116/66  Pulse: 87  Resp: 18  Temp: 98.4 F (36.9 C)  TempSrc: Oral  SpO2: 97%    General appearance: alert; no distress HEENT: [place]; AT Neck: supple with FROM Resp: unlabored respirations Extremities: moves all extremities normally; reports intact sensation to lower extremities; normal strength Skin: warm and dry; no visible rashes Neurologic: gait normal; normal sensation, strength and reflexes of bilateral LE Psychological: alert and cooperative; normal mood and affect     Allergies  Allergen Reactions   Flagyl [Metronidazole Hcl] Other (See Comments)    Causes stomach pain    Past Medical History:  Diagnosis Date   Asthma    Diabetes mellitus without complication (HCC)    Varicose veins    Social History   Socioeconomic History   Marital  status: Married    Spouse name: Not on file   Number of children: Not on file   Years of education: Not on file   Highest education level: Not on file  Occupational History   Not on file  Tobacco Use   Smoking status: Never Smoker   Smokeless tobacco: Never Used  Vaping Use   Vaping Use: Never used  Substance and Sexual Activity   Alcohol use: Yes    Comment: Rare    Drug use: No   Sexual activity: Yes  Other Topics Concern   Not on file  Social History Narrative   Not on file   Social Determinants of Health   Financial Resource Strain:    Difficulty of Paying Living Expenses:   Food Insecurity:    Worried About Charity fundraiser in the Last Year:    Arboriculturist in the Last Year:   Transportation Needs:    Film/video editor (Medical):    Lack of Transportation (Non-Medical):   Physical Activity:    Days of Exercise per Week:    Minutes of Exercise per Session:   Stress:    Feeling of Stress :   Social Connections:    Frequency of Communication with Friends and Family:    Frequency of Social Gatherings with Friends and Family:    Attends Religious Services:    Active Member of Clubs or Organizations:    Attends Archivist Meetings:    Marital Status:    Family History  Problem Relation Age of Onset  Hypertension Father    Migraines Sister    Asthma Brother    Obesity Brother    Cancer Maternal Aunt        ovarian   Cancer Maternal Grandmother        breast   History reviewed. No pertinent surgical history.    Vanessa Kick, MD 12/02/19 [phone]

## 2020-01-08 ENCOUNTER — Other Ambulatory Visit: Payer: Self-pay

## 2020-01-08 ENCOUNTER — Encounter (HOSPITAL_COMMUNITY): Payer: Self-pay | Admitting: Emergency Medicine

## 2020-01-08 ENCOUNTER — Ambulatory Visit (HOSPITAL_COMMUNITY)
Admission: EM | Admit: 2020-01-08 | Discharge: 2020-01-08 | Disposition: A | Discharge status: Home / Self Care | Payer: Medicaid Other | Attending: Family Medicine | Admitting: Family Medicine

## 2020-01-08 DIAGNOSIS — H5712 Ocular pain, left eye: Secondary | ICD-10-CM

## 2020-01-08 HISTORY — DX: Migraine with aura, not intractable, without status migrainosus: G43.109

## 2020-01-08 MED ORDER — TOBRAMYCIN 0.3 % OP SOLN
1.0000 [drp] | Freq: Once | OPHTHALMIC | Status: AC
  Administered 2020-01-08: 1 [drp] via OPHTHALMIC

## 2020-01-08 MED ORDER — TOBRAMYCIN 0.3 % OP SOLN
OPHTHALMIC | Status: AC
  Filled 2020-01-08: qty 5

## 2020-01-08 MED ORDER — TETRACAINE HCL 0.5 % OP SOLN
OPHTHALMIC | Status: AC
  Filled 2020-01-08: qty 4

## 2020-01-08 NOTE — Discharge Instructions (Signed)
You need to see an eye doctor for your Eye pain The doctor on call has offered an appointment but does not participate in your insurance Your PCP might be able to recommend an eye doctor You could also go to an ER

## 2020-01-08 NOTE — ED Provider Notes (Signed)
[facility]    CSN: [id number] Arrival date & time: 01/08/20  1251      History   Chief Complaint Chief Complaint  Patient presents with  . Eye Problem    HPI Susan Guerrero is a 44 y.o. female.   HPI  Patient states that she woke up this morning with pain and decreased vision in her left eye.  She states throughout the day it has not gotten any better.  She has a little bit of a grainy sensation.  She is becoming light sensitive.  Visual acuity is diminished.  No discharge or drainage.  No cold symptoms.  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  With her ocular migraine she will have a headache across the top of her head then her eyes hurt, they do not start with eye pain. Patient is a diabetic but has not been able to continue all of her medical care due to finances   Past Medical History:  Diagnosis Date  . Asthma   . Diabetes mellitus without complication ([place])   . Ocular migraine   . Varicose veins     Patient Active Problem List   Diagnosis Date Noted  . Venous (peripheral) insufficiency 03/22/2018    History reviewed. No pertinent surgical history.  OB History   No obstetric history on file.      Home Medications    Prior to Admission medications   Medication Sig Start Date End Date Taking? Authorizing Provider  cetirizine (ZYRTEC) 10 MG tablet Take 10 mg by mouth daily.  08/24/18  Yes [provider]  gabapentin (NEURONTIN) 300 MG capsule Take 300 mg by mouth at bedtime.  08/24/18  Yes [provider]  albuterol (VENTOLIN HFA) 108 (90 Base) MCG/ACT inhaler Inhale 2 puffs into the lungs every 4 (four) hours as needed for shortness of breath.     [provider]  Multiple Vitamin (MULTIVITAMIN WITH MINERALS) TABS tablet Take 1 tablet by mouth daily.    [provider]  Skin Protectants, Misc. (EUCERIN) cream Apply 1 application topically as needed (ezcema).    [provider]    atorvastatin (LIPITOR) 10 MG tablet TK 1 T PO ONCE D HS 09/05/18 01/08/20  [provider]  furosemide (LASIX) 20 MG tablet Take 10 mg by mouth daily.  08/24/18 01/08/20  [provider]    Family History Family History  Problem Relation Age of Onset  . Hypertension Father   . Migraines Sister   . Asthma Brother   . Obesity Brother   . Cancer Maternal Aunt        ovarian  . Cancer Maternal Grandmother        breast    Social History Social History   Tobacco Use  . Smoking status: Never Smoker  . Smokeless tobacco: Never Used  Vaping Use  . Vaping Use: Never used  Substance Use Topics  . Alcohol use: Yes    Comment: Rare   . Drug use: No     Allergies   Flagyl [metronidazole hcl]   Review of Systems Review of Systems See HPI  Physical Exam Triage Vital Signs ED Triage Vitals  Enc Vitals Group     BP 01/08/20 1416 120/77     Pulse Rate 01/08/20 1416 77     Resp 01/08/20 1416 20     Temp 01/08/20 1416 98 F (36.7 C)     Temp Source 01/08/20 1416 Oral  SpO2 01/08/20 1416 98 %     Weight --      Height --      Head Circumference --      Peak Flow --      Pain Score 01/08/20 1535 2     Pain Loc --      Pain Edu? --      Excl. in [place]? --    No data found.  Updated Vital Signs BP 120/77 (BP Location: Right Arm)   Pulse 77   Temp 98 F (36.7 C) (Oral)   Resp 20   LMP 12/10/2019   SpO2 98%   Visual Acuity Right Eye Distance: 20/50 Left Eye Distance: 20/70 Bilateral Distance: 20/30       Physical Exam Constitutional:      General: She is not in acute distress.    Appearance: She is well-developed.     Comments: Appears uncomfortable  HENT:     Head: Normocephalic and atraumatic.     Mouth/Throat:     Comments: Mask is in place Eyes:     General: Lids are normal. Lids are everted, no foreign bodies appreciated. Gaze aligned appropriately.        Right eye: No foreign body or discharge.        Left eye: No foreign body or  discharge.     Intraocular pressure: Left eye pressure is 18 mmHg. Measurements were taken using a handheld tonometer.    Extraocular Movements: Extraocular movements intact.     Conjunctiva/sclera:     Left eye: Left conjunctiva is injected.     Pupils: Pupils are equal, round, and reactive to light.     Comments: Left eye has mild conjunctival injection.  No foreign body seen.  Eye is irrigated.  Tetracaine and then fluorescein examination reveals no uptake.  No discharge or drainage.  Mild photophobia.  Difficult to see funduscopic area due to photophobia   Cardiovascular:     Rate and Rhythm: Normal rate.  Pulmonary:     Effort: Pulmonary effort is normal. No respiratory distress.  Abdominal:     General: There is no distension.     Palpations: Abdomen is soft.  Musculoskeletal:        General: Normal range of motion.     Cervical back: Normal range of motion.  Skin:    General: Skin is warm and dry.  Neurological:     Mental Status: She is alert.      UC Treatments / Results  Labs (all labs ordered are listed, but only abnormal results are displayed) Labs Reviewed - No data to display  EKG   Radiology No results found.  Procedures Procedures (including critical care time)  Medications Ordered in UC Medications  tobramycin (TOBREX) 0.3 % ophthalmic solution 1 drop (1 drop Both Eyes Given 01/08/20 1530)    Initial Impression / Assessment and Plan / UC Course  I have reviewed the triage vital signs and the nursing notes.  Pertinent labs & imaging results that were available during my care of the patient were reviewed by me and considered in my medical decision making (see chart for details).     Eye pain diminished vision in diabetic.  Patient needs a more complete eye exam.  [facility] is on-call, but does not accept her Medicaid insurance.  Patient will call and see if she can find an eye group that does Final Clinical Impressions(s) / UC Diagnoses    Final diagnoses:  Eye  pain, left     Discharge Instructions     You need to see an eye doctor for your Eye pain The doctor on call has offered an appointment but does not participate in your insurance Your PCP might be able to recommend an eye doctor You could also go to an ER    ED Prescriptions    None     PDMP not reviewed this encounter.   Raylene Everts, MD 01/08/20 [phone]

## 2020-01-08 NOTE — ED Triage Notes (Addendum)
Patient reports blurry vision in right eye as soon as she woke this morning at 8:30 am. Denies any crustiness, patient has had tearing of left eye.  Patient reports a history of ocular migraines  Patient did flush eye with cold water and did use "clear eye" eye drops and had burning in left eye.  Reports vision is still blurry

## 2020-01-10 ENCOUNTER — Emergency Department (HOSPITAL_COMMUNITY): Payer: Medicaid Other

## 2020-01-10 ENCOUNTER — Encounter (HOSPITAL_COMMUNITY): Payer: Self-pay

## 2020-01-10 ENCOUNTER — Other Ambulatory Visit: Payer: Self-pay

## 2020-01-10 ENCOUNTER — Emergency Department (HOSPITAL_COMMUNITY)
Admission: EM | Admit: 2020-01-10 | Discharge: 2020-01-10 | Disposition: A | Discharge status: Home / Self Care | Payer: Medicaid Other | Attending: Emergency Medicine | Admitting: Emergency Medicine

## 2020-01-10 DIAGNOSIS — H53149 Visual discomfort, unspecified: Secondary | ICD-10-CM | POA: Insufficient documentation

## 2020-01-10 DIAGNOSIS — H539 Unspecified visual disturbance: Secondary | ICD-10-CM | POA: Insufficient documentation

## 2020-01-10 DIAGNOSIS — Z7951 Long term (current) use of inhaled steroids: Secondary | ICD-10-CM | POA: Insufficient documentation

## 2020-01-10 DIAGNOSIS — J45909 Unspecified asthma, uncomplicated: Secondary | ICD-10-CM | POA: Diagnosis not present

## 2020-01-10 DIAGNOSIS — H5712 Ocular pain, left eye: Secondary | ICD-10-CM | POA: Diagnosis not present

## 2020-01-10 DIAGNOSIS — E119 Type 2 diabetes mellitus without complications: Secondary | ICD-10-CM | POA: Diagnosis not present

## 2020-01-10 LAB — CBC WITH DIFFERENTIAL/PLATELET
Abs Immature Granulocytes: 0.03 10*3/uL (ref 0.00–0.07)
Basophils Absolute: 0 10*3/uL (ref 0.0–0.1)
Basophils Relative: 1 %
Eosinophils Absolute: 0 10*3/uL (ref 0.0–0.5)
Eosinophils Relative: 0 %
HCT: 40.2 % (ref 36.0–46.0)
Hemoglobin: 12.8 g/dL (ref 12.0–15.0)
Immature Granulocytes: 0 %
Lymphocytes Relative: 30 %
Lymphs Abs: 2.5 10*3/uL (ref 0.7–4.0)
MCH: 28.2 pg (ref 26.0–34.0)
MCHC: 31.8 g/dL (ref 30.0–36.0)
MCV: 88.5 fL (ref 80.0–100.0)
Monocytes Absolute: 1 10*3/uL (ref 0.1–1.0)
Monocytes Relative: 12 %
Neutro Abs: 5 10*3/uL (ref 1.7–7.7)
Neutrophils Relative %: 57 %
Platelets: 288 10*3/uL (ref 150–400)
RBC: 4.54 MIL/uL (ref 3.87–5.11)
RDW: 13.5 % (ref 11.5–15.5)
WBC: 8.6 10*3/uL (ref 4.0–10.5)
nRBC: 0 % (ref 0.0–0.2)

## 2020-01-10 LAB — BASIC METABOLIC PANEL
Anion gap: 9 (ref 5–15)
BUN: 10 mg/dL (ref 6–20)
CO2: 27 mmol/L (ref 22–32)
Calcium: 8.7 mg/dL — ABNORMAL LOW (ref 8.9–10.3)
Chloride: 107 mmol/L (ref 98–111)
Creatinine, Ser: 0.71 mg/dL (ref 0.44–1.00)
GFR calc Af Amer: >60 mL/min (ref >60)
GFR calc non Af Amer: >60 mL/min (ref >60)
Glucose, Bld: 97 mg/dL (ref 70–99)
Potassium: 3.5 mmol/L (ref 3.5–5.1)
Sodium: 143 mmol/L (ref 135–145)

## 2020-01-10 LAB — I-STAT BETA HCG BLOOD, ED (MC, WL, AP ONLY): I-stat hCG, quantitative: <5 m[IU]/mL (ref <5)

## 2020-01-10 IMAGING — MR MR ORBITS WO/W CM
6 series · 43 of 48 positions shown · IV contrast (gadavist)
Comparison: Prior CT from [DATE].

CLINICAL DATA: Initial evaluation for monocular painful visual loss
involving the left eye. Evaluate for possible MS. History of ocular
migraines.

EXAM:
MRI HEAD AND ORBITS WITHOUT AND WITH CONTRAST
TECHNIQUE: Multiplanar, multiecho pulse sequences of the brain and surrounding
structures were obtained without and with intravenous contrast.
Multiplanar, multiecho pulse sequences of the orbits and surrounding
structures were obtained including fat saturation techniques, before
and after intravenous contrast administration.
CONTRAST:  8mL GADAVIST GADOBUTROL 1 MMOL/ML IV SOLN

[Series 16: T2 fat-sat · axial · 3.0mm · 0.47mm/px · z∈[-43,+9]mm · 5 of 17 slices shown (1 of 2)]
[im 1/17]
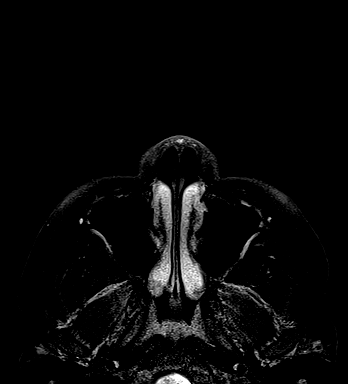
[im 5/17]
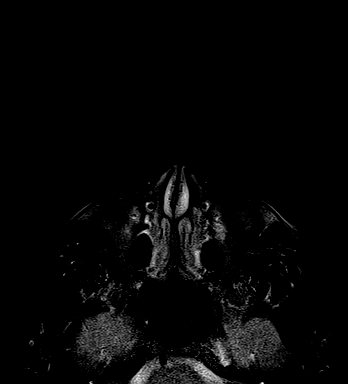
[im 9/17]
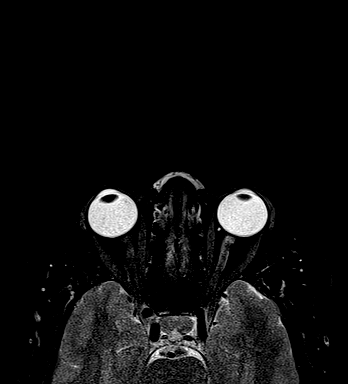
[im 13/17]
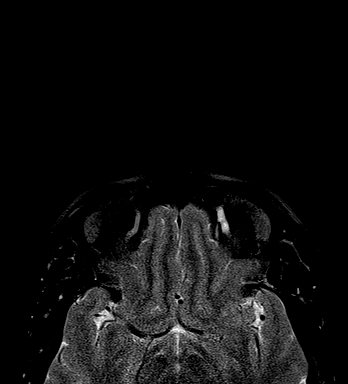
[im 17/17]
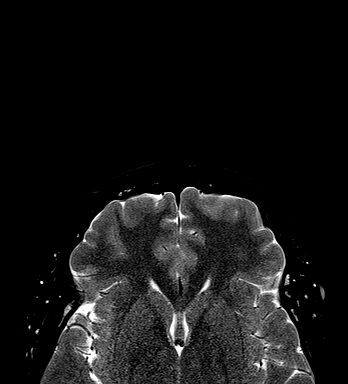

[Series 17: T1 · axial · 3.0mm · 0.56mm/px · z∈[-44,+11]mm · 6 of 18 slices shown (1 of 2)]
[im 1/18]
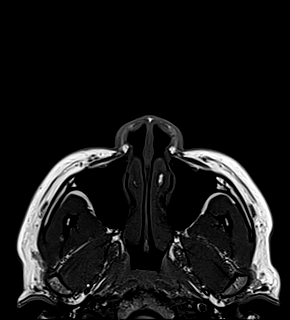
[im 4/18]
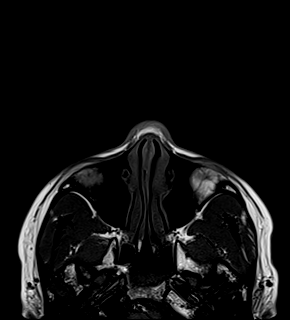
[im 7/18]
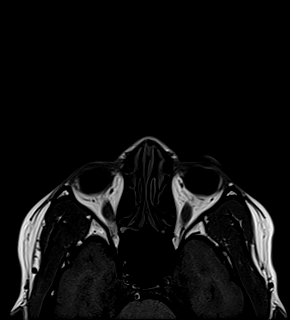
[im 11/18]
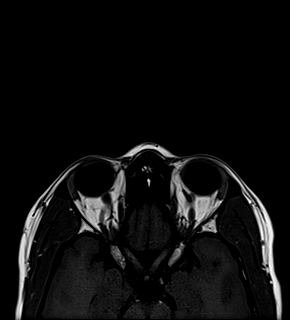
[im 14/18]
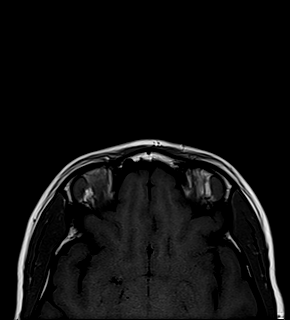
[im 18/18]
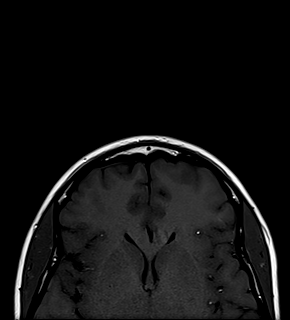

[Series 18: T2 fat-sat · coronal · 3.0mm · 0.47mm/px · 9 of 26 slices shown (2 of 2)]
[im 1/26]
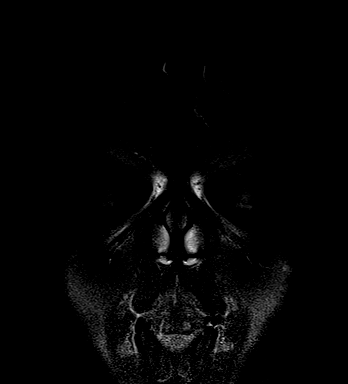
[im 3/26]
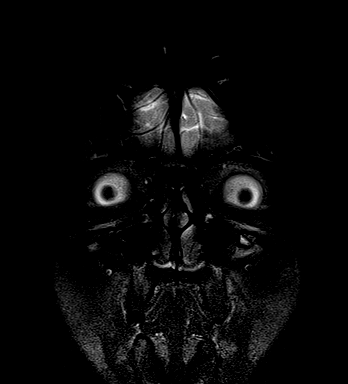
[im 6/26]
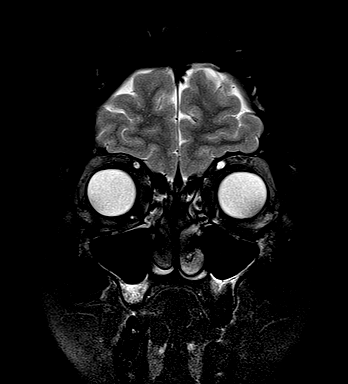
[im 9/26]
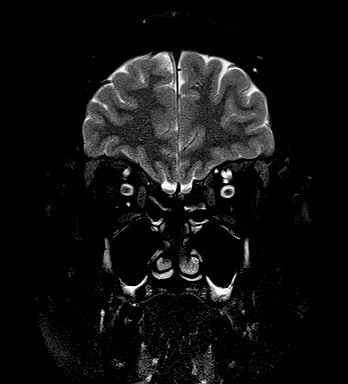
[im 12/26]
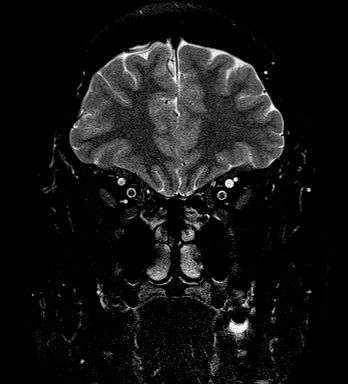
[im 14/26]
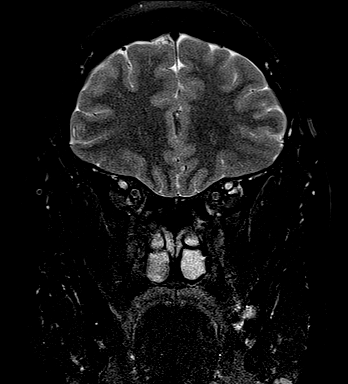
[im 17/26]
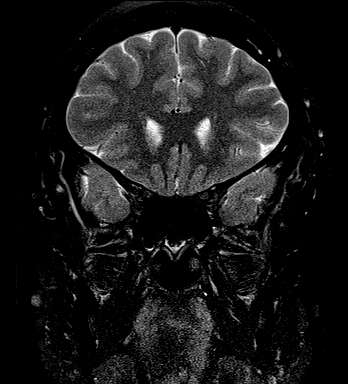
[im 23/26]
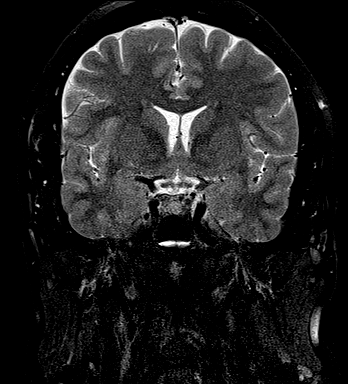
[im 26/26]
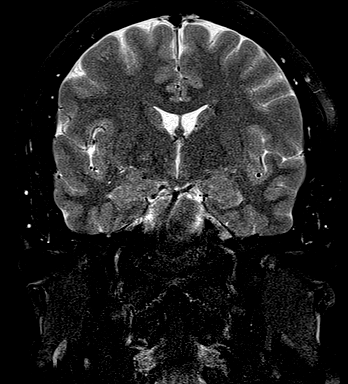

[Series 19: T1 · coronal · 3.0mm · 0.56mm/px · 8 of 26 slices shown (2 of 2)]
[im 1/26]
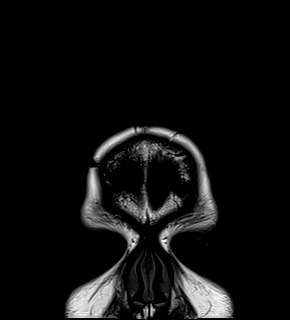
[im 3/26]
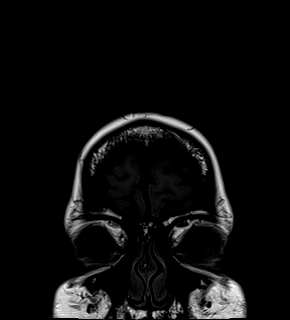
[im 9/26]
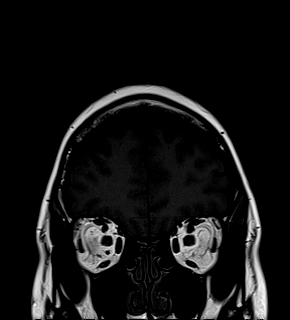
[im 12/26]
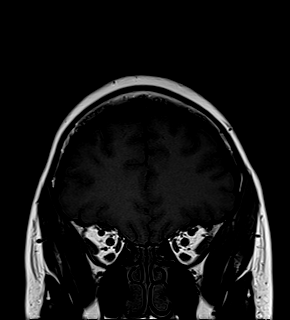
[im 14/26]
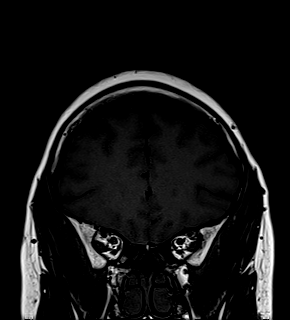
[im 17/26]
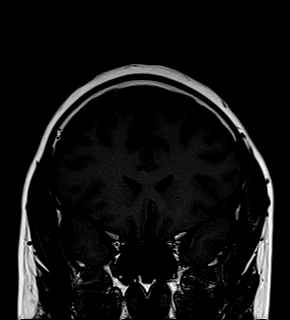
[im 23/26]
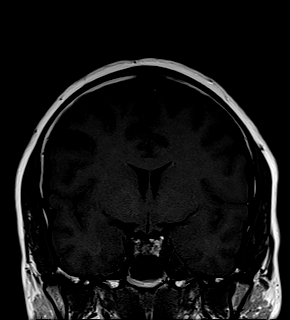
[im 26/26]
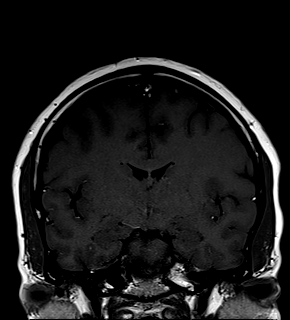

[Series 20: T1 fat-sat post-contrast · axial · 3.0mm · 0.56mm/px · z∈[-44,+11]mm · 7 of 18 slices shown (1 of 2)]
[im 1/18]
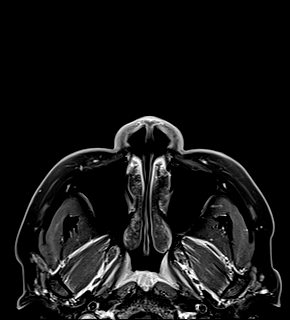
[im 3/18]
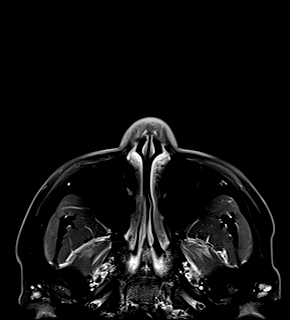
[im 6/18]
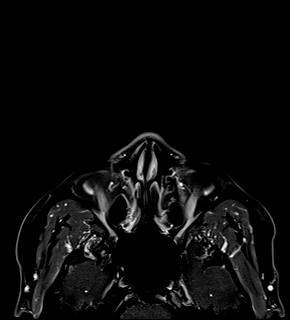
[im 9/18]
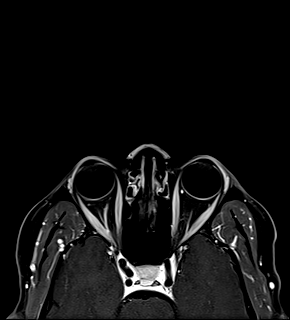
[im 12/18]
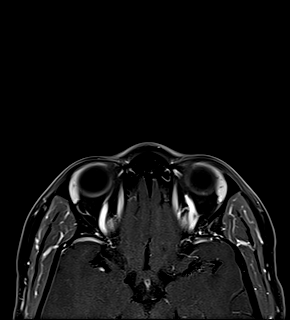
[im 15/18]
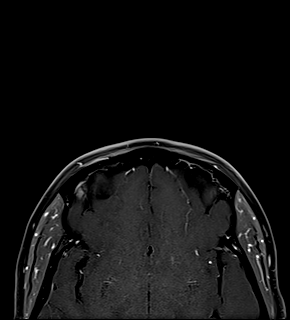
[im 18/18]
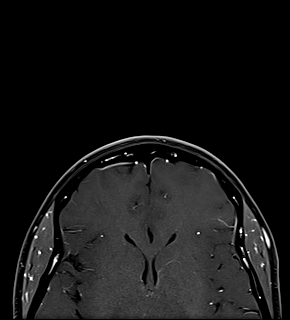

[Series 21: T1 fat-sat post-contrast · coronal · 3.0mm · 0.70mm/px · 8 of 26 slices shown (2 of 2)]
[im 1/26]
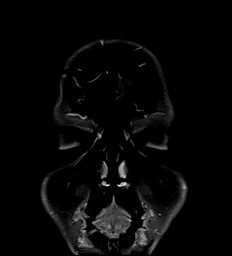
[im 3/26]
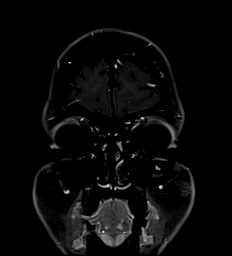
[im 9/26]
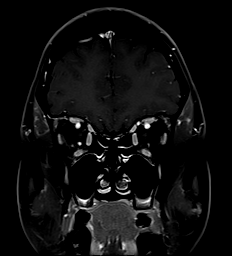
[im 12/26]
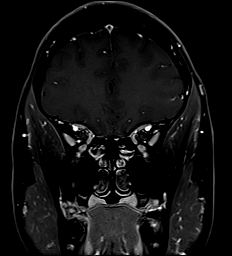
[im 14/26]
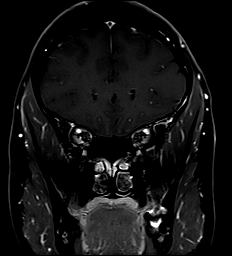
[im 17/26]
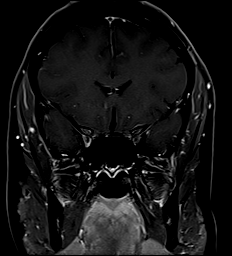
[im 23/26]
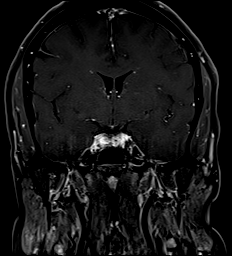
[im 26/26]
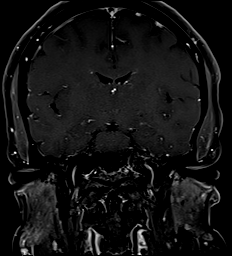

[43 of 48 positions shown; findings below may reference images not displayed]

FINDINGS: MRI HEAD FINDINGS

Brain: Cerebral volume within normal limits for patient age. No
focal parenchymal signal abnormality identified. No findings to
suggest demyelinating disease.

No abnormal foci of restricted diffusion to suggest acute or
subacute ischemia. Gray-white matter differentiation well
maintained. No encephalomalacia to suggest chronic infarction. No
foci of susceptibility artifact to suggest acute or chronic
intracranial hemorrhage.

No mass lesion, midline shift or mass effect. No hydrocephalus. No
extra-axial fluid collection. Major dural sinuses are grossly
patent.

Pituitary gland and suprasellar region are normal. Midline
structures intact and normal.

No abnormal enhancement.

Vascular: Major intracranial vascular flow voids well maintained and
normal in appearance.

Skull and upper cervical spine: Craniocervical junction normal.
Visualized upper cervical spine within normal limits. Bone marrow
signal intensity normal. No scalp soft tissue abnormality.

Sinuses/Orbits: Globes and orbital soft tissues evaluated on
concomitant orbit exam. Mild scattered mucosal thickening noted
within the ethmoidal air cells. Paranasal sinuses are otherwise
clear. No mastoid effusion. Inner ear structures grossly normal.

Paranasal sinuses are clear. No mastoid effusion. Inner ear
structures normal.

Other: None.

MRI ORBITS FINDINGS

Orbits: Globes are symmetric in size with normal appearance and
morphology. Optic nerves symmetric and within normal limits
bilaterally. No intrinsic or perineural enhancement. No abnormality
about the orbital apices or cavernous sinus. Optic chiasm normally
situated within the suprasellar cistern without abnormality. Optic
radiations within normal limits.

Remainder of the orbital structures including the extraocular
muscles, lacrimal glands, and superior orbital veins within normal
limits. Intraconal and extraconal fat well-maintained without
inflammatory changes.

Visualized sinuses: Mild mucosal thickening within the ethmoidal air
cells. Paranasal sinuses are otherwise clear.

Soft tissues: Unremarkable.

Limited intracranial: Unremarkable.
IMPRESSION: Normal MRI of the brain and orbits. No findings to explain patient's
symptoms identified.

## 2020-01-10 IMAGING — MR MR HEAD WO/W CM
14 series · 48 of 48 positions shown · IV contrast (gadavist)
Comparison: Prior CT from [DATE].

CLINICAL DATA: Initial evaluation for monocular painful visual loss
involving the left eye. Evaluate for possible MS. History of ocular
migraines.

EXAM:
MRI HEAD AND ORBITS WITHOUT AND WITH CONTRAST
TECHNIQUE: Multiplanar, multiecho pulse sequences of the brain and surrounding
structures were obtained without and with intravenous contrast.
Multiplanar, multiecho pulse sequences of the orbits and surrounding
structures were obtained including fat saturation techniques, before
and after intravenous contrast administration.
CONTRAST:  8mL GADAVIST GADOBUTROL 1 MMOL/ML IV SOLN

[Series 5: DWI · axial · 3.0mm · 1.36mm/px · z∈[-60,+91]mm · 7 of 102 slices shown (1 of 4)]
[im 1/102]
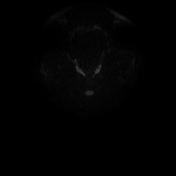
[im 17/102]
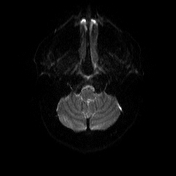
[im 34/102]
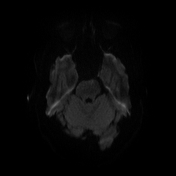
[im 51/102]
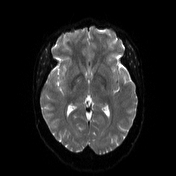
[im 68/102]
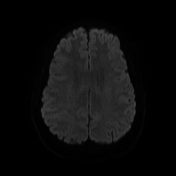
[im 85/102]
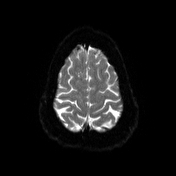
[im 102/102]
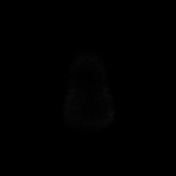

[Series 6: DWI · axial · 3.0mm · 1.36mm/px · z∈[-60,+91]mm · 3 of 52 slices shown (2 of 4)]
[im 1/52]
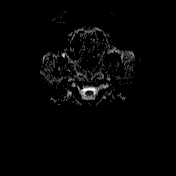
[im 26/52]
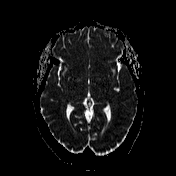
[im 52/52]
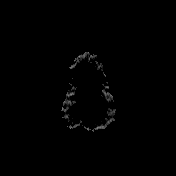

[Series 7: T1 · sagittal · 5.0mm · 0.75mm/px · 1 of 25 slices shown (1 of 2)]
[im 1/25]
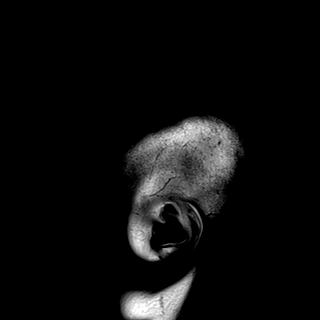

[Series 8: T2 · axial · 5.0mm · 0.57mm/px · 1 of 25 slices shown (1 of 2)]
[im 1/25]
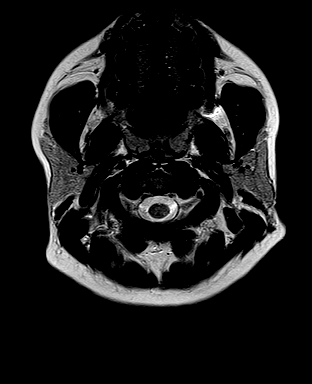

[Series 9: T2 · coronal · 5.0mm · 0.57mm/px · 2 of 30 slices shown (2 of 2)]
[im 1/30]
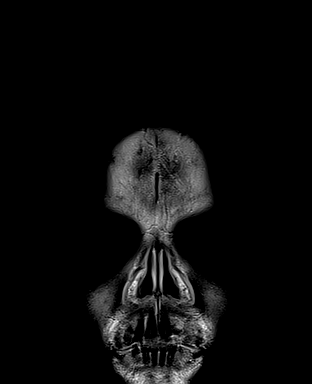
[im 30/30]
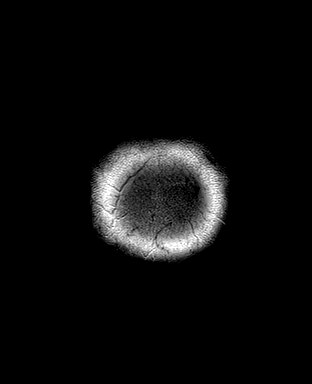

[Series 10: mip_images(sw) · axial · 32.0mm · 0.69mm/px · z∈[-49,+77]mm · 2 of 33 slices shown]
[im 1/33]
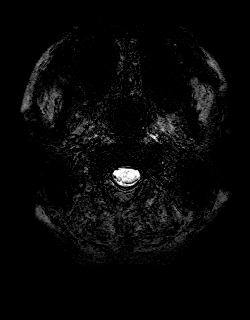
[im 33/33]
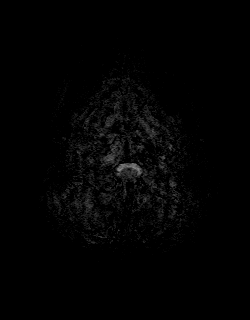

[Series 11: swi_images · axial · 4.0mm · 0.69mm/px · z∈[-63,+91]mm · 2 of 40 slices shown]
[im 1/40]
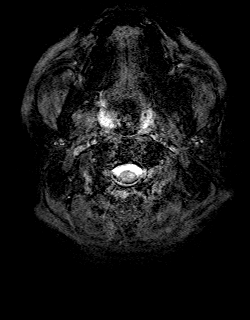
[im 40/40]
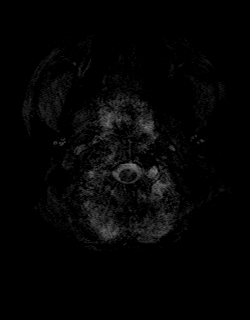

[Series 12: FLAIR · axial · 3.0mm · 0.69mm/px · z∈[-64,+92]mm · 3 of 54 slices shown]
[im 1/54]
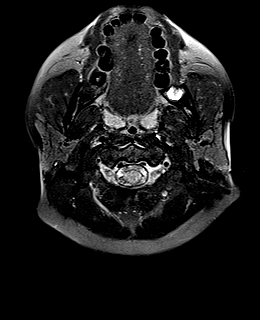
[im 27/54]
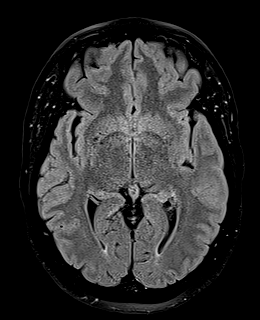
[im 54/54]
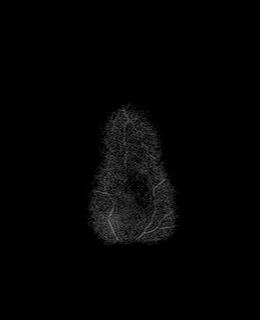

[Series 13: T1 · axial · 1.0mm · 0.86mm/px · z∈[-58,+99]mm · 9 of 160 slices shown (2 of 2)]
[im 1/160]
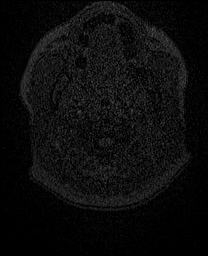
[im 20/160]
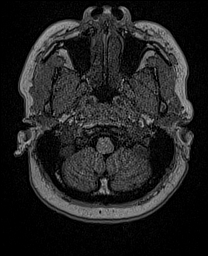
[im 40/160]
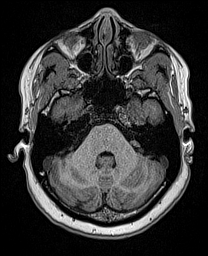
[im 60/160]
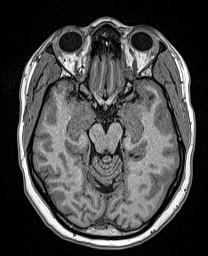
[im 80/160]
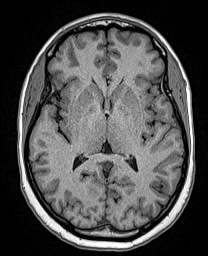
[im 100/160]
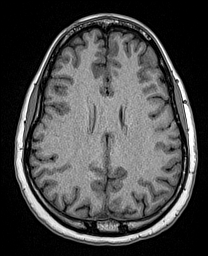
[im 120/160]
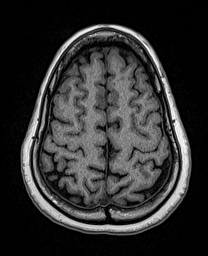
[im 140/160]
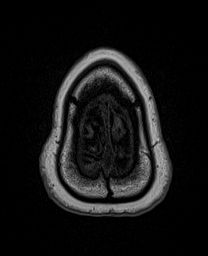
[im 160/160]
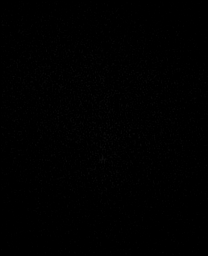

[Series 14: DWI · coronal · 5.0mm · 1.31mm/px · 4 of 76 slices shown (3 of 4)]
[im 1/76]
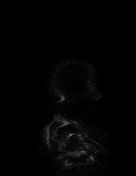
[im 26/76]
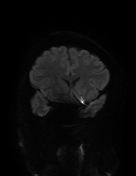
[im 51/76]
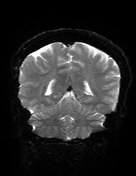
[im 76/76]
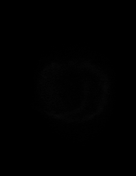

[Series 15: DWI · coronal · 5.0mm · 1.31mm/px · 2 of 38 slices shown (4 of 4)]
[im 1/38]
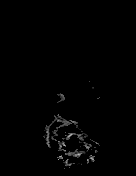
[im 38/38]
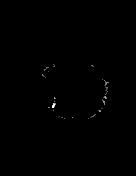

[Series 22: T1 post-contrast · axial · 1.0mm · 0.86mm/px · z∈[-58,+99]mm · 9 of 160 slices shown (1 of 3)]
[im 1/160]
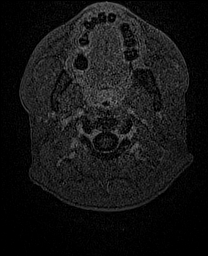
[im 20/160]
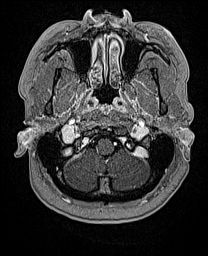
[im 40/160]
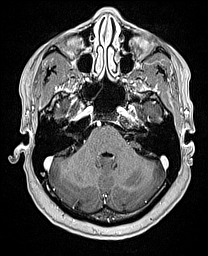
[im 60/160]
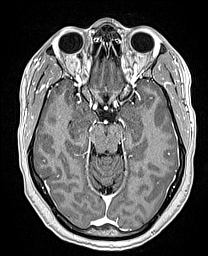
[im 80/160]
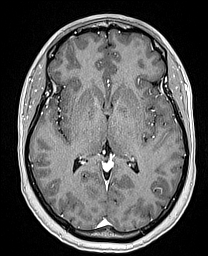
[im 100/160]
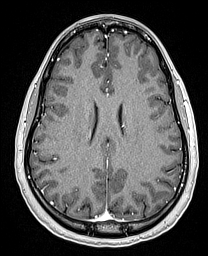
[im 120/160]
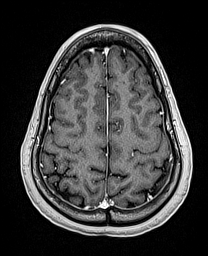
[im 140/160]
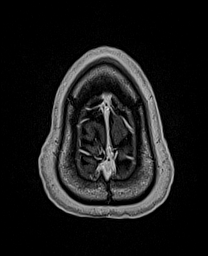
[im 160/160]
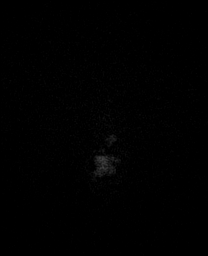

[Series 23: T1 post-contrast · coronal · 5.0mm · 0.43mm/px · 2 of 30 slices shown (2 of 3)]
[im 1/30]
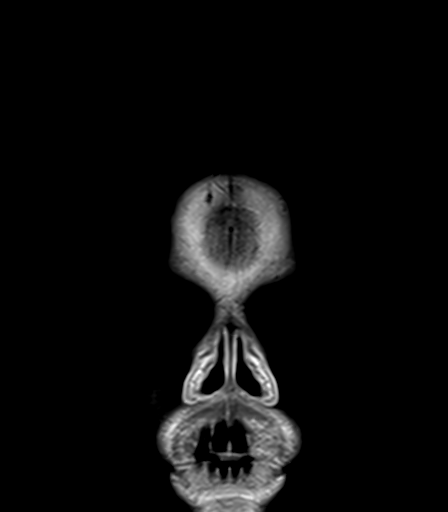
[im 30/30]
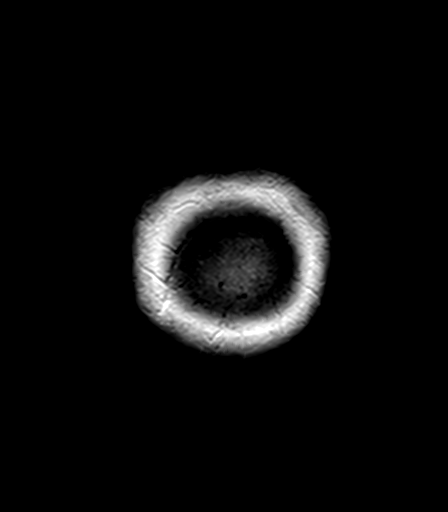

[Series 24: T1 post-contrast · sagittal · 5.0mm · 0.75mm/px · 1 of 25 slices shown (3 of 3)]
[im 1/25]
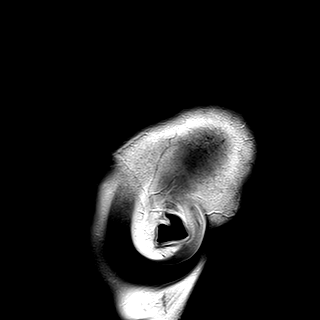

[48 of 48 positions shown; findings below may reference images not displayed]

FINDINGS: MRI HEAD FINDINGS

Brain: Cerebral volume within normal limits for patient age. No
focal parenchymal signal abnormality identified. No findings to
suggest demyelinating disease.

No abnormal foci of restricted diffusion to suggest acute or
subacute ischemia. Gray-white matter differentiation well
maintained. No encephalomalacia to suggest chronic infarction. No
foci of susceptibility artifact to suggest acute or chronic
intracranial hemorrhage.

No mass lesion, midline shift or mass effect. No hydrocephalus. No
extra-axial fluid collection. Major dural sinuses are grossly
patent.

Pituitary gland and suprasellar region are normal. Midline
structures intact and normal.

No abnormal enhancement.

Vascular: Major intracranial vascular flow voids well maintained and
normal in appearance.

Skull and upper cervical spine: Craniocervical junction normal.
Visualized upper cervical spine within normal limits. Bone marrow
signal intensity normal. No scalp soft tissue abnormality.

Sinuses/Orbits: Globes and orbital soft tissues evaluated on
concomitant orbit exam. Mild scattered mucosal thickening noted
within the ethmoidal air cells. Paranasal sinuses are otherwise
clear. No mastoid effusion. Inner ear structures grossly normal.

Paranasal sinuses are clear. No mastoid effusion. Inner ear
structures normal.

Other: None.

MRI ORBITS FINDINGS

Orbits: Globes are symmetric in size with normal appearance and
morphology. Optic nerves symmetric and within normal limits
bilaterally. No intrinsic or perineural enhancement. No abnormality
about the orbital apices or cavernous sinus. Optic chiasm normally
situated within the suprasellar cistern without abnormality. Optic
radiations within normal limits.

Remainder of the orbital structures including the extraocular
muscles, lacrimal glands, and superior orbital veins within normal
limits. Intraconal and extraconal fat well-maintained without
inflammatory changes.

Visualized sinuses: Mild mucosal thickening within the ethmoidal air
cells. Paranasal sinuses are otherwise clear.

Soft tissues: Unremarkable.

Limited intracranial: Unremarkable.
IMPRESSION: Normal MRI of the brain and orbits. No findings to explain patient's
symptoms identified.

## 2020-01-10 MED ORDER — GADOBUTROL 1 MMOL/ML IV SOLN
8.0000 mL | Freq: Once | INTRAVENOUS | Status: AC | PRN
  Administered 2020-01-10: 8 mL via INTRAVENOUS

## 2020-01-10 NOTE — ED Provider Notes (Addendum)
[facility] DEPT Provider Note   CSN: [id number] Arrival date & time: 01/10/20  1439     History Chief Complaint  Patient presents with  . Eye Problem    Susan Guerrero is a 44 y.o. female with pertinent past medical history of asthma, diabetes, ocular migraine that presents emergency department today for eye pain.  Patient states that eye pain on left side with blurry vision has been occurring since when she woke up on Monday.  Denies any discharge.  Patient was seen in urgent care 2 days ago, at that time she had normal intraocular pressures, left conjunctiva was injected.  Were able to do fluorescein examination which revealed no uptake.  Was referred to [facility] and she states that they did a bunch of tests today, was sent to the emergency department for concerns of acute retrobulbar neuritis and for need of MRI.  When speaking to patient, she states that she does have family history of MS, does often have numbness and tingling in her left leg, is not experiencing that currently.  Has never had any eye issues that have lingered this long before.  States that she does have ocular migraine, states that her eye pain on her left side normally goes away within that day however it has stayed for 3 days now.  Also admits to a mild headache on her left side.  Has not take anything for this.  Denies any fevers, chills, sinus pressure, rashes, cough, confusion, weakness, current paresthesias, nausea, vomiting, abdominal pain, chest pain, shortness breath.  HPI     Past Medical History:  Diagnosis Date  . Asthma   . Diabetes mellitus without complication ([place])   . Ocular migraine   . Varicose veins     Patient Active Problem List   Diagnosis Date Noted  . Venous (peripheral) insufficiency 03/22/2018    History reviewed. No pertinent surgical history.   OB History   No obstetric history on file.     Family History  Problem Relation Age of  Onset  . Hypertension Father   . Migraines Sister   . Asthma Brother   . Obesity Brother   . Cancer Maternal Aunt        ovarian  . Cancer Maternal Grandmother        breast    Social History   Tobacco Use  . Smoking status: Never Smoker  . Smokeless tobacco: Never Used  Vaping Use  . Vaping Use: Never used  Substance Use Topics  . Alcohol use: Yes    Comment: Rare   . Drug use: No    Home Medications Prior to Admission medications   Medication Sig Start Date End Date Taking? Authorizing Provider  albuterol (VENTOLIN HFA) 108 (90 Base) MCG/ACT inhaler Inhale 2 puffs into the lungs every 4 (four) hours as needed for shortness of breath.     [provider]  cetirizine (ZYRTEC) 10 MG tablet Take 10 mg by mouth daily.  08/24/18   [provider]  gabapentin (NEURONTIN) 300 MG capsule Take 300 mg by mouth at bedtime.  08/24/18   [provider]  Multiple Vitamin (MULTIVITAMIN WITH MINERALS) TABS tablet Take 1 tablet by mouth daily.    [provider]  Skin Protectants, Misc. (EUCERIN) cream Apply 1 application topically as needed (ezcema).    [provider]  atorvastatin (LIPITOR) 10 MG tablet TK 1 T PO ONCE D HS 09/05/18 01/08/20  [provider]  furosemide (  LASIX) 20 MG tablet Take 10 mg by mouth daily.  08/24/18 01/08/20  [provider]    Allergies    Flagyl [metronidazole hcl]  Review of Systems   Review of Systems  Constitutional: Negative for chills, diaphoresis, fatigue and fever.  HENT: Negative for congestion, sore throat and trouble swallowing.   Eyes: Positive for photophobia, pain, redness and visual disturbance. Negative for itching.  Respiratory: Negative for cough, shortness of breath and wheezing.   Cardiovascular: Negative for chest pain, palpitations and leg swelling.  Gastrointestinal: Negative for abdominal distention, abdominal pain, diarrhea, nausea and vomiting.  Genitourinary: Negative for  difficulty urinating.  Musculoskeletal: Negative for back pain, neck pain and neck stiffness.  Skin: Negative for pallor.  Neurological: Negative for dizziness, speech difficulty, weakness and headaches.  Psychiatric/Behavioral: Negative for confusion.    Physical Exam Updated Vital Signs BP 123/83 (BP Location: Right Arm)   Pulse 75   Temp 98.1 F (36.7 C) (Oral)   Resp 18   Ht 5\' 2"  (1.575 m)   Wt 81.6 kg   LMP 01/09/2020   SpO2 99%   BMI 32.92 kg/m   Physical Exam Constitutional:      General: She is not in acute distress.    Appearance: Normal appearance. She is not ill-appearing, toxic-appearing or diaphoretic.  HENT:     Head: Normocephalic and atraumatic.     Mouth/Throat:     Mouth: Mucous membranes are moist.     Pharynx: Oropharynx is clear.  Eyes:     General: No scleral icterus.    Extraocular Movements: Extraocular movements intact.     Pupils: Pupils are equal, round, and reactive to light.     Comments: Left eye slightly injected, EOMs intact.  Extremely painful EOMs.  PERRLA.  No exudate or hemorrhage.  No foreign body.  Patient is unable to see fingers in front of her centrally or peripherally, states that my fingers in front of her are extremely blurry compared to right.  Right side normal.  Cardiovascular:     Rate and Rhythm: Normal rate and regular rhythm.     Pulses: Normal pulses.     Heart sounds: Normal heart sounds.  Pulmonary:     Effort: Pulmonary effort is normal. No respiratory distress.     Breath sounds: Normal breath sounds. No stridor. No wheezing, rhonchi or rales.  Chest:     Chest wall: No tenderness.  Abdominal:     General: Abdomen is flat. There is no distension.     Palpations: Abdomen is soft.     Tenderness: There is no abdominal tenderness. There is no guarding or rebound.  Musculoskeletal:        General: No swelling or tenderness. Normal range of motion.     Cervical back: Normal range of motion and neck supple. No  rigidity.     Right lower leg: No edema.     Left lower leg: No edema.  Skin:    General: Skin is warm and dry.     Capillary Refill: Capillary refill takes less than 2 seconds.     Coloration: Skin is not pale.  Neurological:     General: No focal deficit present.     Mental Status: She is alert and oriented to person, place, and time.     Comments: Alert. Clear speech. No facial droop. CNIII-XII grossly intact. Bilateral upper and lower extremities' sensation grossly intact. 5/5 symmetric strength with grip strength and with plantar and dorsi flexion  bilaterally. Normal finger to nose bilaterally. Negative pronator drift. Negative Romberg sign. Gait is steady and intact.   Psychiatric:        Mood and Affect: Mood normal.        Behavior: Behavior normal.     ED Results / Procedures / Treatments   Labs (all labs ordered are listed, but only abnormal results are displayed) Labs Reviewed  BASIC METABOLIC PANEL - Abnormal; Notable for the following components:      Result Value   Calcium 8.7 (*)    All other components within normal limits  CBC WITH DIFFERENTIAL/PLATELET  I-STAT BETA HCG BLOOD, ED ([facility], [facility], [facility] ONLY)    EKG None  Radiology MR Brain W and Wo Contrast  Result Date: 01/10/2020 CLINICAL DATA:  Initial evaluation for monocular painful visual loss involving the left eye. Evaluate for possible MS. History of ocular migraines. EXAM: MRI HEAD AND ORBITS WITHOUT AND WITH CONTRAST TECHNIQUE: Multiplanar, multiecho pulse sequences of the brain and surrounding structures were obtained without and with intravenous contrast. Multiplanar, multiecho pulse sequences of the orbits and surrounding structures were obtained including fat saturation techniques, before and after intravenous contrast administration. CONTRAST:  73mL GADAVIST GADOBUTROL 1 MMOL/ML IV SOLN COMPARISON:  Prior CT from 08/21/2016. FINDINGS: MRI HEAD FINDINGS Brain: Cerebral volume within normal limits for patient  age. No focal parenchymal signal abnormality identified. No findings to suggest demyelinating disease. No abnormal foci of restricted diffusion to suggest acute or subacute ischemia. Gray-white matter differentiation well maintained. No encephalomalacia to suggest chronic infarction. No foci of susceptibility artifact to suggest acute or chronic intracranial hemorrhage. No mass lesion, midline shift or mass effect. No hydrocephalus. No extra-axial fluid collection. Major dural sinuses are grossly patent. Pituitary gland and suprasellar region are normal. Midline structures intact and normal. No abnormal enhancement. Vascular: Major intracranial vascular flow voids well maintained and normal in appearance. Skull and upper cervical spine: Craniocervical junction normal. Visualized upper cervical spine within normal limits. Bone marrow signal intensity normal. No scalp soft tissue abnormality. Sinuses/Orbits: Globes and orbital soft tissues evaluated on concomitant orbit exam. Mild scattered mucosal thickening noted within the ethmoidal air cells. Paranasal sinuses are otherwise clear. No mastoid effusion. Inner ear structures grossly normal. Paranasal sinuses are clear. No mastoid effusion. Inner ear structures normal. Other: None. MRI ORBITS FINDINGS Orbits: Globes are symmetric in size with normal appearance and morphology. Optic nerves symmetric and within normal limits bilaterally. No intrinsic or perineural enhancement. No abnormality about the orbital apices or cavernous sinus. Optic chiasm normally situated within the suprasellar cistern without abnormality. Optic radiations within normal limits. Remainder of the orbital structures including the extraocular muscles, lacrimal glands, and superior orbital veins within normal limits. Intraconal and extraconal fat well-maintained without inflammatory changes. Visualized sinuses: Mild mucosal thickening within the ethmoidal air cells. Paranasal sinuses are otherwise  clear. Soft tissues: Unremarkable. Limited intracranial: Unremarkable. IMPRESSION: Normal MRI of the brain and orbits. No findings to explain patient's symptoms identified. Electronically Signed   By: Jeannine Boga M.D.   On: 01/10/2020 19:28   MR ORBITS W WO CONTRAST  Result Date: 01/10/2020 CLINICAL DATA:  Initial evaluation for monocular painful visual loss involving the left eye. Evaluate for possible MS. History of ocular migraines. EXAM: MRI HEAD AND ORBITS WITHOUT AND WITH CONTRAST TECHNIQUE: Multiplanar, multiecho pulse sequences of the brain and surrounding structures were obtained without and with intravenous contrast. Multiplanar, multiecho pulse sequences of the orbits and surrounding structures were obtained including fat saturation  techniques, before and after intravenous contrast administration. CONTRAST:  65mL GADAVIST GADOBUTROL 1 MMOL/ML IV SOLN COMPARISON:  Prior CT from 08/21/2016. FINDINGS: MRI HEAD FINDINGS Brain: Cerebral volume within normal limits for patient age. No focal parenchymal signal abnormality identified. No findings to suggest demyelinating disease. No abnormal foci of restricted diffusion to suggest acute or subacute ischemia. Gray-white matter differentiation well maintained. No encephalomalacia to suggest chronic infarction. No foci of susceptibility artifact to suggest acute or chronic intracranial hemorrhage. No mass lesion, midline shift or mass effect. No hydrocephalus. No extra-axial fluid collection. Major dural sinuses are grossly patent. Pituitary gland and suprasellar region are normal. Midline structures intact and normal. No abnormal enhancement. Vascular: Major intracranial vascular flow voids well maintained and normal in appearance. Skull and upper cervical spine: Craniocervical junction normal. Visualized upper cervical spine within normal limits. Bone marrow signal intensity normal. No scalp soft tissue abnormality. Sinuses/Orbits: Globes and orbital  soft tissues evaluated on concomitant orbit exam. Mild scattered mucosal thickening noted within the ethmoidal air cells. Paranasal sinuses are otherwise clear. No mastoid effusion. Inner ear structures grossly normal. Paranasal sinuses are clear. No mastoid effusion. Inner ear structures normal. Other: None. MRI ORBITS FINDINGS Orbits: Globes are symmetric in size with normal appearance and morphology. Optic nerves symmetric and within normal limits bilaterally. No intrinsic or perineural enhancement. No abnormality about the orbital apices or cavernous sinus. Optic chiasm normally situated within the suprasellar cistern without abnormality. Optic radiations within normal limits. Remainder of the orbital structures including the extraocular muscles, lacrimal glands, and superior orbital veins within normal limits. Intraconal and extraconal fat well-maintained without inflammatory changes. Visualized sinuses: Mild mucosal thickening within the ethmoidal air cells. Paranasal sinuses are otherwise clear. Soft tissues: Unremarkable. Limited intracranial: Unremarkable. IMPRESSION: Normal MRI of the brain and orbits. No findings to explain patient's symptoms identified. Electronically Signed   By: Jeannine Boga M.D.   On: 01/10/2020 19:28    Procedures Procedures (including critical care time)  Medications Ordered in ED Medications  gadobutrol (GADAVIST) 1 MMOL/ML injection 8 mL (8 mLs Intravenous Contrast Given 01/10/20 1833)    ED Course  I have reviewed the triage vital signs and the nursing notes.  Pertinent labs & imaging results that were available during my care of the patient were reviewed by me and considered in my medical decision making (see chart for details).    MDM Rules/Calculators/A&P                         Susan Guerrero is a 44 y.o. female with pertinent past medical history of asthma, diabetes, ocular migraine that presents emergency department today for eye pain and  blurry vision for the last 2 days. Saw [facility] today who sent her to the emergency department for MRI orbits and MRI brain with neurology evaluation. No need for repeat eye exam and eye tests since patient just came from [facility] and was able to see report.  No facial pain, no signs of orbital cellulitis.  Patient does have a painful EOMs. Patient is continuing to have the symptoms, MRI brain and orbits negative. Will consult neurology at this time for further recommendation.  39 spoke to Dr. Malen Gauze, who recommended that patient does not need further neurology work-up, states that she can see opthomology outpatient.   Doubt need for further emergent work up at this time. I explained the diagnosis and have given explicit precautions to return to the ER including  for any other new or worsening symptoms. The patient understands and accepts the medical plan as it's been dictated and I have answered their questions. Discharge instructions concerning home care and prescriptions have been given. The patient is STABLE and is discharged to home in good condition.  I discussed this case with my attending physician who cosigned this note including patient's presenting symptoms, physical exam, and planned diagnostics and interventions. Attending physician stated agreement with plan or made changes to plan which were implemented.   Attending physician assessed patient at bedside.   Final Clinical Impression(s) / ED Diagnoses Final diagnoses:  Eye pain, left    Rx / DC Orders ED Discharge Orders    None       Alfredia Client, PA-C 01/10/20 2033    Alfredia Client, PA-C 01/10/20 2034    Isla Pence, MD 01/10/20 2136

## 2020-01-10 NOTE — ED Notes (Signed)
An After Visit Summary was printed and given to the patient. Discharge instructions given and no further questions at this time.  

## 2020-01-10 NOTE — ED Notes (Signed)
ED Provider at bedside. 

## 2020-01-10 NOTE — ED Notes (Signed)
Pt stuck twice for IV access, both unsuccessful.  

## 2020-01-10 NOTE — ED Notes (Signed)
Pt returned from MRI °

## 2020-01-10 NOTE — ED Notes (Signed)
Patient transported to MRI 

## 2020-01-10 NOTE — Discharge Instructions (Addendum)
Your MRIs were negative.  You need to follow-up with ophthalmology.  If you have any new or worsening concerning symptoms, to the emerge department.  Please see your ophthalmologist tomorrow.

## 2020-01-10 NOTE — ED Notes (Signed)
Labeled urine specimen and culture sent to lab. ENMiles 

## 2020-01-10 NOTE — ED Triage Notes (Signed)
Pt c/o left eye blurriness and pain since Monday hx of occular migraines states she had an episode very similar to this in the past.   Was seen at urgent care, referred to [facility], dx with acute retrobulbar neuritis of left eye, sent to ED for MRI Brain and Orbit w/&w/o contrast and neuro eval

## 2020-02-05 ENCOUNTER — Telehealth: Payer: Medicaid Other | Admitting: Internal Medicine

## 2020-07-09 ENCOUNTER — Ambulatory Visit (HOSPITAL_COMMUNITY)
Admission: EM | Admit: 2020-07-09 | Discharge: 2020-07-09 | Disposition: A | Discharge status: Home / Self Care | Payer: Medicaid Other | Attending: Student | Admitting: Student

## 2020-07-09 ENCOUNTER — Encounter (HOSPITAL_COMMUNITY): Payer: Self-pay

## 2020-07-09 ENCOUNTER — Other Ambulatory Visit: Payer: Self-pay

## 2020-07-09 DIAGNOSIS — R112 Nausea with vomiting, unspecified: Secondary | ICD-10-CM | POA: Diagnosis present

## 2020-07-09 DIAGNOSIS — Z20822 Contact with and (suspected) exposure to covid-19: Secondary | ICD-10-CM | POA: Diagnosis present

## 2020-07-09 DIAGNOSIS — J45909 Unspecified asthma, uncomplicated: Secondary | ICD-10-CM | POA: Insufficient documentation

## 2020-07-09 DIAGNOSIS — Z9851 Tubal ligation status: Secondary | ICD-10-CM | POA: Insufficient documentation

## 2020-07-09 LAB — POC INFLUENZA A AND B ANTIGEN (URGENT CARE ONLY)
Influenza A Ag: NEGATIVE
Influenza B Ag: NEGATIVE

## 2020-07-09 MED ORDER — ONDANSETRON HCL 4 MG/2ML IJ SOLN
4.0000 mg | Freq: Once | INTRAMUSCULAR | Status: AC
  Administered 2020-07-09: 4 mg via INTRAMUSCULAR

## 2020-07-09 MED ORDER — LOPERAMIDE HCL 2 MG PO CAPS: 2.0000 mg | ORAL_CAPSULE | Freq: Four times a day (QID) | ORAL | 0 refills | Status: DC | PRN

## 2020-07-09 MED ORDER — PROMETHAZINE HCL 25 MG PO TABS: 25.0000 mg | ORAL_TABLET | Freq: Four times a day (QID) | ORAL | 0 refills | Status: AC | PRN

## 2020-07-09 MED ORDER — ONDANSETRON HCL 4 MG/2ML IJ SOLN
INTRAMUSCULAR | Status: AC
  Filled 2020-07-09: qty 2

## 2020-07-09 MED ORDER — ONDANSETRON 4 MG PO TBDP
ORAL_TABLET | ORAL | Status: AC
  Filled 2020-07-09: qty 1

## 2020-07-09 NOTE — ED Triage Notes (Signed)
Pt c/o fever, diarrhea, generalized body aches and SOB.  Pt states she took Tylenol around 1200 to relieve the fever. She states she has been feeling nauseas x 4 days. She states she has taken Zofran but it has not been helpful.

## 2020-07-09 NOTE — Discharge Instructions (Addendum)
-  Promethazine for nausea, up to every 6 hours. This can make you drowsy, so don't take before driving, operating machinery, etc.  -Make sure to drink plenty of fluids and eat bland foods as tolerated.   We are currently awaiting result of your PCR covid-19 test. This typically comes back in 1-2 days. We'll call you if the result is positive. Otherwise, the result will be sent electronically to your MyChart. You can also call this clinic and ask for your result via telephone.   Please isolate at home while awaiting these results. If your test is positive for Covid-19, continue to isolate at home for 5 days if you have mild symptoms, or a total of 10 days from symptom onset if you have more severe symptoms. If you quarantine for a shorter period of time (i.e. 5 days), make sure to wear a mask until day 10 of symptoms. Treat your symptoms at home with OTC remedies like tylenol/ibuprofen, mucinex, nyquil, etc. Seek medical attention if you develop high fevers, chest pain, shortness of breath, ear pain, facial pain, etc. Make sure to get up and move around every 2-3 hours while convalescing to help prevent blood clots. Drink plenty of fluids, and rest as much as possible.

## 2020-07-09 NOTE — ED Provider Notes (Addendum)
[place]    CSN: [id number] Arrival date & time: 07/09/20  1653      History   Chief Complaint Chief Complaint  Patient presents with  . Generalized Body Aches  . Fever  . Shortness of Breath  . Nausea    HPI Susan Guerrero is a 45 y.o. female Presenting for URI symptoms for 4 days following positive exposure to covid. History of asthma controlled on albuterol, tubal ligation. States she presented to [facility] on day 1 of symptoms and had a negative covid test at that time. They prescribed her sublingual zofran for nausea which has not been controlling her nausea adequately. Continues to vomit and have multiple episodes of diarrhea daily. occ cough. Denies fevers/chills, abd pain, shortness of breath, chest pain,  facial pain, teeth pain, headaches, sore throat, loss of taste/smell, swollen lymph nodes, ear pain. Denies chest pain, shortness of breath, confusion, high fevers.  Fully vaccinated for covid-19.   HPI  Past Medical History:  Diagnosis Date  . Asthma   . Diabetes mellitus without complication ([place])   . Ocular migraine   . Varicose veins     Patient Active Problem List   Diagnosis Date Noted  . Venous (peripheral) insufficiency 03/22/2018    History reviewed. No pertinent surgical history.  OB History   No obstetric history on file.      Home Medications    Prior to Admission medications   Medication Sig Start Date End Date Taking? Authorizing Provider  promethazine (PHENERGAN) 25 MG tablet Take 1 tablet (25 mg total) by mouth every 6 (six) hours as needed for nausea or vomiting. 07/09/20  Yes Hazel Sams, PA-C  albuterol (VENTOLIN HFA) 108 (90 Base) MCG/ACT inhaler Inhale 2 puffs into the lungs every 4 (four) hours as needed for shortness of breath.     [provider]  cetirizine (ZYRTEC) 10 MG tablet Take 10 mg by mouth daily.  08/24/18   [provider]  gabapentin (NEURONTIN) 300 MG capsule Take 300 mg by mouth  at bedtime.  08/24/18   [provider]  loperamide (IMODIUM) 2 MG capsule Take 1 capsule (2 mg total) by mouth 4 (four) times daily as needed for diarrhea or loose stools. 07/09/20   Hazel Sams, PA-C  Multiple Vitamin (MULTIVITAMIN WITH MINERALS) TABS tablet Take 1 tablet by mouth daily.    [provider]  Skin Protectants, Misc. (EUCERIN) cream Apply 1 application topically as needed (ezcema).    [provider]  atorvastatin (LIPITOR) 10 MG tablet TK 1 T PO ONCE D HS 09/05/18 01/08/20  [provider]  furosemide (LASIX) 20 MG tablet Take 10 mg by mouth daily.  08/24/18 01/08/20  [provider]    Family History Family History  Problem Relation Age of Onset  . Hypertension Father   . Migraines Sister   . Asthma Brother   . Obesity Brother   . Cancer Maternal Aunt        ovarian  . Cancer Maternal Grandmother        breast    Social History Social History   Tobacco Use  . Smoking status: Never Smoker  . Smokeless tobacco: Never Used  Vaping Use  . Vaping Use: Never used  Substance Use Topics  . Alcohol use: Yes    Comment: Rare   . Drug use: No     Allergies   Flagyl [metronidazole hcl]   Review of Systems Review of Systems  Constitutional:  Negative for appetite change, chills and fever.  HENT: Positive for congestion. Negative for ear pain, rhinorrhea, sinus pressure, sinus pain and sore throat.   Eyes: Negative for redness and visual disturbance.  Respiratory: Positive for cough. Negative for chest tightness, shortness of breath and wheezing.   Cardiovascular: Negative for chest pain and palpitations.  Gastrointestinal: Positive for diarrhea, nausea and vomiting. Negative for abdominal pain and constipation.  Genitourinary: Negative for dysuria, frequency and urgency.  Musculoskeletal: Negative for myalgias.  Neurological: Negative for dizziness, weakness and headaches.  Psychiatric/Behavioral: Negative for  confusion.  All other systems reviewed and are negative.    Physical Exam Triage Vital Signs ED Triage Vitals  Enc Vitals Group     BP 07/09/20 1737 107/63     Pulse Rate 07/09/20 1737 73     Resp 07/09/20 1737 18     Temp 07/09/20 1737 98.3 F (36.8 C)     Temp Source 07/09/20 1737 Oral     SpO2 07/09/20 1737 97 %     Weight --      Height --      Head Circumference --      Peak Flow --      Pain Score 07/09/20 1735 8     Pain Loc --      Pain Edu? --      Excl. in [place]? --    No data found.  Updated Vital Signs BP 107/63 (BP Location: Right Arm)   Pulse 73   Temp 98.3 F (36.8 C) (Oral)   Resp 18   LMP 06/26/2020 (Exact Date)   SpO2 97%   Visual Acuity Right Eye Distance:   Left Eye Distance:   Bilateral Distance:    Right Eye Near:   Left Eye Near:    Bilateral Near:     Physical Exam Vitals reviewed.  Constitutional:      General: She is not in acute distress.    Appearance: Normal appearance. She is not ill-appearing.  HENT:     Head: Normocephalic and atraumatic.     Right Ear: Hearing, tympanic membrane, ear canal and external ear normal. No swelling or tenderness. There is no impacted cerumen. No mastoid tenderness. Tympanic membrane is not perforated, erythematous, retracted or bulging.     Left Ear: Hearing, tympanic membrane, ear canal and external ear normal. No swelling or tenderness. There is no impacted cerumen. No mastoid tenderness. Tympanic membrane is not perforated, erythematous, retracted or bulging.     Nose:     Right Sinus: No maxillary sinus tenderness or frontal sinus tenderness.     Left Sinus: No maxillary sinus tenderness or frontal sinus tenderness.     Mouth/Throat:     Mouth: Mucous membranes are moist.     Pharynx: Uvula midline. No oropharyngeal exudate or posterior oropharyngeal erythema.     Tonsils: No tonsillar exudate.  Cardiovascular:     Rate and Rhythm: Normal rate and regular rhythm.     Heart sounds: Normal heart  sounds.  Pulmonary:     Breath sounds: Normal breath sounds and air entry. No wheezing, rhonchi or rales.  Chest:     Chest wall: No tenderness.  Abdominal:     General: Abdomen is flat. Bowel sounds are normal.     Tenderness: There is generalized abdominal tenderness. There is no right CVA tenderness, left CVA tenderness, guarding or rebound. Negative signs include Murphy's sign, Rovsing's sign and McBurney's sign.  Lymphadenopathy:     Cervical:  No cervical adenopathy.  Neurological:     General: No focal deficit present.     Mental Status: She is alert and oriented to person, place, and time.  Psychiatric:        Attention and Perception: Attention and perception normal.        Mood and Affect: Mood and affect normal.        Behavior: Behavior normal. Behavior is cooperative.        Thought Content: Thought content normal.        Judgment: Judgment normal.      UC Treatments / Results  Labs (all labs ordered are listed, but only abnormal results are displayed) Labs Reviewed  SARS CORONAVIRUS 2 (TAT 6-24 HRS)    EKG   Radiology No results found.  Procedures Procedures (including critical care time)  Medications Ordered in UC Medications  ondansetron (ZOFRAN) injection 4 mg (has no administration in time range)    Initial Impression / Assessment and Plan / UC Course  I have reviewed the triage vital signs and the nursing notes.  Pertinent labs & imaging results that were available during my care of the patient were reviewed by me and considered in my medical decision making (see chart for details).     Covid test sent today. Pt with positive exposure to covid. Patient is fully vaccinated for covid-19. Isolation precautions per CDC guidelines until negative result. Symptomatic relief with OTC Mucinex, Nyquil, etc. Return precautions- new/worsening fevers/chills, shortness of breath, chest pain, abd pain, etc.   For nausea/vomiting, zofran injection administered  today. Given treatment failure with sublingual zofran at home, promethazine sent. She is not pregnant. rec good hydration, BRAT diet.   For asthma, continue albuterol inhaler.  Rapid influenza negative.   Spent over 40 minutes obtaining H&P, performing physical, discussing results, treatment plan and plan for follow-up with patient. Patient agrees with plan.    Final Clinical Impressions(s) / UC Diagnoses   Final diagnoses:  Exposure to COVID-19 virus  Suspected COVID-19 virus infection  Intractable vomiting with nausea, unspecified vomiting type  History of bilateral tubal ligation  Uncomplicated asthma, unspecified asthma severity, unspecified whether persistent     Discharge Instructions     -Promethazine for nausea, up to every 6 hours. This can make you drowsy, so don't take before driving, operating machinery, etc.  -Make sure to drink plenty of fluids and eat bland foods as tolerated.   We are currently awaiting result of your PCR covid-19 test. This typically comes back in 1-2 days. We'll call you if the result is positive. Otherwise, the result will be sent electronically to your MyChart. You can also call this clinic and ask for your result via telephone.   Please isolate at home while awaiting these results. If your test is positive for Covid-19, continue to isolate at home for 5 days if you have mild symptoms, or a total of 10 days from symptom onset if you have more severe symptoms. If you quarantine for a shorter period of time (i.e. 5 days), make sure to wear a mask until day 10 of symptoms. Treat your symptoms at home with OTC remedies like tylenol/ibuprofen, mucinex, nyquil, etc. Seek medical attention if you develop high fevers, chest pain, shortness of breath, ear pain, facial pain, etc. Make sure to get up and move around every 2-3 hours while convalescing to help prevent blood clots. Drink plenty of fluids, and rest as much as possible.     ED Prescriptions  Medication Sig Dispense Auth. Provider   promethazine (PHENERGAN) 25 MG tablet Take 1 tablet (25 mg total) by mouth every 6 (six) hours as needed for nausea or vomiting. 30 tablet Hazel Sams, PA-C   loperamide (IMODIUM) 2 MG capsule  (Status: Discontinued) Take 1 capsule (2 mg total) by mouth 4 (four) times daily as needed for diarrhea or loose stools. 12 capsule Hazel Sams, PA-C   loperamide (IMODIUM) 2 MG capsule Take 1 capsule (2 mg total) by mouth 4 (four) times daily as needed for diarrhea or loose stools. 12 capsule Hazel Sams, PA-C     PDMP not reviewed this encounter.   Hazel Sams, PA-C 07/09/20 1803    Hazel Sams, PA-C 07/09/20 1805

## 2020-07-10 LAB — SARS CORONAVIRUS 2 (TAT 6-24 HRS): SARS Coronavirus 2: NEGATIVE

## 2020-10-15 ENCOUNTER — Encounter (HOSPITAL_COMMUNITY): Payer: Self-pay

## 2020-10-15 ENCOUNTER — Ambulatory Visit (HOSPITAL_COMMUNITY)
Admission: EM | Admit: 2020-10-15 | Discharge: 2020-10-15 | Disposition: A | Discharge status: Home / Self Care | Payer: Medicaid Other | Attending: Student | Admitting: Student

## 2020-10-15 ENCOUNTER — Other Ambulatory Visit: Payer: Self-pay

## 2020-10-15 DIAGNOSIS — A059 Bacterial foodborne intoxication, unspecified: Secondary | ICD-10-CM

## 2020-10-15 DIAGNOSIS — Z1152 Encounter for screening for COVID-19: Secondary | ICD-10-CM | POA: Diagnosis present

## 2020-10-15 DIAGNOSIS — R197 Diarrhea, unspecified: Secondary | ICD-10-CM

## 2020-10-15 DIAGNOSIS — R112 Nausea with vomiting, unspecified: Secondary | ICD-10-CM | POA: Diagnosis present

## 2020-10-15 MED ORDER — ONDANSETRON 8 MG PO TBDP: 8.0000 mg | ORAL_TABLET | Freq: Three times a day (TID) | ORAL | 0 refills | Status: DC | PRN

## 2020-10-15 MED ORDER — LOPERAMIDE HCL 2 MG PO CAPS: 2.0000 mg | ORAL_CAPSULE | Freq: Four times a day (QID) | ORAL | 0 refills | Status: DC | PRN

## 2020-10-15 NOTE — ED Triage Notes (Signed)
Pt c/o stomach pain since Sunday after dinner at a [employer]. She states she has had diarrhea and fever. Pt states the sxs have worsened.

## 2020-10-15 NOTE — Discharge Instructions (Addendum)
-  Take the Zofran (ondansetron) up to 3 times daily for nausea and vomiting. Dissolve one pill under your tongue or between your teeth and your cheek. -Take the Imodium (loperamide) up to 4 times daily for diarrhea. -For fevers/chills- Take Tylenol 1000 mg 3 times daily, and ibuprofen 800 mg 3 times daily with food.  You can take these together, or alternate every 3-4 hours. -Make sure to drink plenty of fluids and eat a bland diet as tolerated. -Seek additional medical attention if you develop new symptoms like dizziness, chest pain, shortness of breath, weakness, severe abdominal pain, inability to keep fluids down despite treatment, etc.

## 2020-10-15 NOTE — ED Provider Notes (Signed)
[facility]    CSN: [id number] Arrival date & time: 10/15/20  1720      History   Chief Complaint Chief Complaint  Patient presents with  . Abdominal Pain  . Fever  . Diarrhea    HPI Susan Guerrero is a 45 y.o. female Pt c/o nausea with vomiting, diarrhea, generalized crampy abdominal pain, fevers since Sunday (3 days ago) after dinner at a [employer].  States that symptoms are slowly improving on their own.  Fever as high as 102 yesterday that was reduced with Motrin.  Has not taken antipyretics today.  6 episodes of watery diarrhea today, denies blood in stool.  2 episodes of vomiting today, but nausea throughout the day.  Decreased appetite.  Able to keep fluids down but has not attempted much food.  Denies dizziness, shortness of breath, weakness, chest pain, headaches.  Denies urinary symptoms. States another member of her party also developed food poisoning after eating out.  HPI  Past Medical History:  Diagnosis Date  . Asthma   . Diabetes mellitus without complication ([place])   . Ocular migraine   . Varicose veins     Patient Active Problem List   Diagnosis Date Noted  . Venous (peripheral) insufficiency 03/22/2018    History reviewed. No pertinent surgical history.  OB History   No obstetric history on file.      Home Medications    Prior to Admission medications   Medication Sig Start Date End Date Taking? Authorizing Provider  loperamide (IMODIUM) 2 MG capsule Take 1 capsule (2 mg total) by mouth 4 (four) times daily as needed for diarrhea or loose stools. 10/15/20  Yes Hazel Sams, PA-C  ondansetron (ZOFRAN ODT) 8 MG disintegrating tablet Take 1 tablet (8 mg total) by mouth every 8 (eight) hours as needed for nausea or vomiting. 10/15/20  Yes Hazel Sams, PA-C  albuterol (VENTOLIN HFA) 108 (90 Base) MCG/ACT inhaler Inhale 2 puffs into the lungs every 4 (four) hours as needed for shortness of breath.     [provider]   cetirizine (ZYRTEC) 10 MG tablet Take 10 mg by mouth daily.  08/24/18   [provider]  gabapentin (NEURONTIN) 300 MG capsule Take 300 mg by mouth at bedtime.  08/24/18   [provider]  Multiple Vitamin (MULTIVITAMIN WITH MINERALS) TABS tablet Take 1 tablet by mouth daily.    [provider]  promethazine (PHENERGAN) 25 MG tablet Take 1 tablet (25 mg total) by mouth every 6 (six) hours as needed for nausea or vomiting. 07/09/20   Hazel Sams, PA-C  Skin Protectants, Misc. (EUCERIN) cream Apply 1 application topically as needed (ezcema).    [provider]  atorvastatin (LIPITOR) 10 MG tablet TK 1 T PO ONCE D HS 09/05/18 01/08/20  [provider]  furosemide (LASIX) 20 MG tablet Take 10 mg by mouth daily.  08/24/18 01/08/20  [provider]    Family History Family History  Problem Relation Age of Onset  . Hypertension Father   . Migraines Sister   . Asthma Brother   . Obesity Brother   . Cancer Maternal Aunt        ovarian  . Cancer Maternal Grandmother        breast    Social History Social History   Tobacco Use  . Smoking status: Never Smoker  . Smokeless tobacco: Never Used  Vaping Use  . Vaping Use: Never used  Substance Use Topics  .  Alcohol use: Yes    Comment: Rare   . Drug use: No     Allergies   Flagyl [metronidazole hcl]   Review of Systems Review of Systems  Constitutional: Positive for chills and fever. Negative for appetite change, diaphoresis and unexpected weight change.  HENT: Negative for congestion, ear pain, sinus pressure, sinus pain, sneezing, sore throat and trouble swallowing.   Respiratory: Negative for cough, chest tightness and shortness of breath.   Cardiovascular: Negative for chest pain.  Gastrointestinal: Positive for abdominal pain, diarrhea, nausea and vomiting. Negative for abdominal distention, anal bleeding, blood in stool, constipation and rectal pain.  Genitourinary: Negative  for dysuria, flank pain, frequency and urgency.  Musculoskeletal: Negative for back pain and myalgias.  Neurological: Negative for dizziness, light-headedness and headaches.  All other systems reviewed and are negative.    Physical Exam Triage Vital Signs ED Triage Vitals  Enc Vitals Group     BP 10/15/20 1805 100/71     Pulse Rate 10/15/20 1805 76     Resp 10/15/20 1805 20     Temp 10/15/20 1805 98.5 F (36.9 C)     Temp Source 10/15/20 1805 Oral     SpO2 10/15/20 1805 98 %     Weight --      Height --      Head Circumference --      Peak Flow --      Pain Score 10/15/20 1802 8     Pain Loc --      Pain Edu? --      Excl. in [place]? --    No data found.  Updated Vital Signs BP 100/71 (BP Location: Right Arm)   Pulse 76   Temp 98.5 F (36.9 C) (Oral)   Resp 20   LMP  (LMP Unknown)   SpO2 98%   Visual Acuity Right Eye Distance:   Left Eye Distance:   Bilateral Distance:    Right Eye Near:   Left Eye Near:    Bilateral Near:     Physical Exam Vitals reviewed.  Constitutional:      General: She is not in acute distress.    Appearance: Normal appearance. She is not ill-appearing.  HENT:     Head: Normocephalic and atraumatic.     Mouth/Throat:     Mouth: Mucous membranes are moist.     Comments: Moist mucous membranes Eyes:     Extraocular Movements: Extraocular movements intact.     Pupils: Pupils are equal, round, and reactive to light.  Cardiovascular:     Rate and Rhythm: Normal rate and regular rhythm.     Heart sounds: Normal heart sounds.  Pulmonary:     Effort: Pulmonary effort is normal.     Breath sounds: Normal breath sounds. No wheezing, rhonchi or rales.  Abdominal:     General: Bowel sounds are increased. There is no distension.     Palpations: Abdomen is soft. There is no mass.     Tenderness: There is generalized abdominal tenderness. There is no right CVA tenderness, left CVA tenderness, guarding or rebound. Negative signs include Murphy's  sign, Rovsing's sign and McBurney's sign.  Skin:    General: Skin is warm.     Capillary Refill: Capillary refill takes less than 2 seconds.     Comments: Good skin turgor  Neurological:     General: No focal deficit present.     Mental Status: She is alert and oriented to person, place, and time.  Psychiatric:        Mood and Affect: Mood normal.        Behavior: Behavior normal.      UC Treatments / Results  Labs (all labs ordered are listed, but only abnormal results are displayed) Labs Reviewed  SARS CORONAVIRUS 2 (TAT 6-24 HRS)    EKG   Radiology No results found.  Procedures Procedures (including critical care time)  Medications Ordered in UC Medications - No data to display  Initial Impression / Assessment and Plan / UC Course  I have reviewed the triage vital signs and the nursing notes.  Pertinent labs & imaging results that were available during my care of the patient were reviewed by me and considered in my medical decision making (see chart for details).     This patient is a 45 year old female presenting with food poisoning following eating out. Today this pt is afebrile nontachycardic nontachypneic, oxygenating well on room air, no wheezes rhonchi or rales.  Appears well-hydrated.  Generalized crampy abdominal pain, but no focal pain.  BP is 100/71 but this is in normal range for this patient. Denies dizziness, weakness. States she could not be pregnant.   Plan to treat conservatively with Zofran, Imodium, good hydration, brat diet. Work note provided. Covid PCR sent at patient request. Strict ED return precautions discussed.  Final Clinical Impressions(s) / UC Diagnoses   Final diagnoses:  Food poisoning  Non-intractable vomiting with nausea, unspecified vomiting type  Diarrhea of presumed infectious origin  Encounter for screening for COVID-19     Discharge Instructions     -Take the Zofran (ondansetron) up to 3 times daily for nausea and  vomiting. Dissolve one pill under your tongue or between your teeth and your cheek. -Take the Imodium (loperamide) up to 4 times daily for diarrhea. -For fevers/chills- Take Tylenol 1000 mg 3 times daily, and ibuprofen 800 mg 3 times daily with food.  You can take these together, or alternate every 3-4 hours. -Make sure to drink plenty of fluids and eat a bland diet as tolerated. -Seek additional medical attention if you develop new symptoms like dizziness, chest pain, shortness of breath, weakness, severe abdominal pain, inability to keep fluids down despite treatment, etc.     ED Prescriptions    Medication Sig Dispense Auth. Provider   ondansetron (ZOFRAN ODT) 8 MG disintegrating tablet Take 1 tablet (8 mg total) by mouth every 8 (eight) hours as needed for nausea or vomiting. 20 tablet Hazel Sams, PA-C   loperamide (IMODIUM) 2 MG capsule Take 1 capsule (2 mg total) by mouth 4 (four) times daily as needed for diarrhea or loose stools. 21 capsule Hazel Sams, PA-C     PDMP not reviewed this encounter.   Hazel Sams, PA-C 10/15/20 1851

## 2020-10-16 LAB — SARS CORONAVIRUS 2 (TAT 6-24 HRS): SARS Coronavirus 2: NEGATIVE

## 2021-01-01 ENCOUNTER — Emergency Department (HOSPITAL_BASED_OUTPATIENT_CLINIC_OR_DEPARTMENT_OTHER)
Admission: EM | Admit: 2021-01-01 | Discharge: 2021-01-01 | Disposition: A | Discharge status: Home / Self Care | Payer: Medicaid Other | Attending: Emergency Medicine | Admitting: Emergency Medicine

## 2021-01-01 ENCOUNTER — Other Ambulatory Visit: Payer: Self-pay

## 2021-01-01 ENCOUNTER — Encounter (HOSPITAL_BASED_OUTPATIENT_CLINIC_OR_DEPARTMENT_OTHER): Payer: Self-pay | Admitting: *Deleted

## 2021-01-01 ENCOUNTER — Other Ambulatory Visit (HOSPITAL_BASED_OUTPATIENT_CLINIC_OR_DEPARTMENT_OTHER): Payer: Self-pay

## 2021-01-01 DIAGNOSIS — J45909 Unspecified asthma, uncomplicated: Secondary | ICD-10-CM | POA: Diagnosis not present

## 2021-01-01 DIAGNOSIS — E119 Type 2 diabetes mellitus without complications: Secondary | ICD-10-CM | POA: Insufficient documentation

## 2021-01-01 DIAGNOSIS — R1110 Vomiting, unspecified: Secondary | ICD-10-CM | POA: Insufficient documentation

## 2021-01-01 DIAGNOSIS — J029 Acute pharyngitis, unspecified: Secondary | ICD-10-CM | POA: Insufficient documentation

## 2021-01-01 DIAGNOSIS — R059 Cough, unspecified: Secondary | ICD-10-CM | POA: Insufficient documentation

## 2021-01-01 DIAGNOSIS — M549 Dorsalgia, unspecified: Secondary | ICD-10-CM | POA: Diagnosis not present

## 2021-01-01 DIAGNOSIS — R0789 Other chest pain: Secondary | ICD-10-CM | POA: Insufficient documentation

## 2021-01-01 DIAGNOSIS — H9209 Otalgia, unspecified ear: Secondary | ICD-10-CM | POA: Insufficient documentation

## 2021-01-01 DIAGNOSIS — R5383 Other fatigue: Secondary | ICD-10-CM | POA: Diagnosis not present

## 2021-01-01 DIAGNOSIS — R509 Fever, unspecified: Secondary | ICD-10-CM | POA: Diagnosis not present

## 2021-01-01 MED ORDER — PROMETHAZINE-DM 6.25-15 MG/5ML PO SYRP
5.0000 mL | ORAL_SOLUTION | Freq: Four times a day (QID) | ORAL | 0 refills | Status: AC | PRN
  Filled 2021-01-01: qty 118, 6d supply, fill #0

## 2021-01-01 MED ORDER — IPRATROPIUM-ALBUTEROL 0.5-2.5 (3) MG/3ML IN SOLN
3.0000 mL | Freq: Once | RESPIRATORY_TRACT | Status: AC
  Administered 2021-01-01: 3 mL via RESPIRATORY_TRACT
  Filled 2021-01-01: qty 3

## 2021-01-01 MED ORDER — KETOROLAC TROMETHAMINE 60 MG/2ML IM SOLN
30.0000 mg | Freq: Once | INTRAMUSCULAR | Status: AC
  Administered 2021-01-01: 30 mg via INTRAMUSCULAR
  Filled 2021-01-01: qty 2

## 2021-01-01 MED ORDER — METHOCARBAMOL 500 MG PO TABS
500.0000 mg | ORAL_TABLET | Freq: Three times a day (TID) | ORAL | 0 refills | Status: DC | PRN
  Filled 2021-01-01: qty 15, 5d supply, fill #0

## 2021-01-01 MED ORDER — NAPROXEN 500 MG PO TABS
500.0000 mg | ORAL_TABLET | Freq: Two times a day (BID) | ORAL | 0 refills | Status: AC | PRN
  Filled 2021-01-01: qty 15, 8d supply, fill #0

## 2021-01-01 NOTE — ED Triage Notes (Signed)
Dx pneumonia x 2 days ago , c/o chest wall pain with coughing , no relief with tessalon p.

## 2021-01-01 NOTE — ED Provider Notes (Signed)
[facility] Provider Note   CSN: [id number] Arrival date & time: 01/01/21  1344     History Chief Complaint  Patient presents with   Cough    Susan Guerrero is a 45 y.o. female with a hx of diabetes mellitus, asthma, and ocular migraines who presents to the ED with complaints of persistent cough and chest discomfort x 5 days. Patient reports she developed sxs of fatigue, subjective fevers, ear pain, sore throat, and cough productive of green phlegm sputum. Has had some wheezing/dyspnea improved with inhaler use. She also noted right sided chest/back pain that occurs with coughing primarily but remains sore otherwise. Has had some post tussive emesis with forceful coughing, no other vomiting. Seen @ UC 07/18- diagnosed with pneumonia and given prescriptions for azithromycin/amoxicillin and tessalon which she is taking without much improvement. Denies leg pain/swelling, hemoptysis, recent surgery/trauma, recent long travel, personal hx of cancer, or hx of DVT/PE. She uses birth control patch.     HPI     Past Medical History:  Diagnosis Date   Asthma    Diabetes mellitus without complication ([place])    Ocular migraine    Varicose veins     Patient Active Problem List   Diagnosis Date Noted   Venous (peripheral) insufficiency 03/22/2018    History reviewed. No pertinent surgical history.   OB History   No obstetric history on file.     Family History  Problem Relation Age of Onset   Hypertension Father    Migraines Sister    Asthma Brother    Obesity Brother    Cancer Maternal Aunt        ovarian   Cancer Maternal Grandmother        breast    Social History   Tobacco Use   Smoking status: Never   Smokeless tobacco: Never  Vaping Use   Vaping Use: Never used  Substance Use Topics   Alcohol use: Yes    Comment: Rare    Drug use: No    Home Medications Prior to Admission medications   Medication Sig Start Date End Date  Taking? Authorizing Provider  albuterol (VENTOLIN HFA) 108 (90 Base) MCG/ACT inhaler Inhale 2 puffs into the lungs every 4 (four) hours as needed for shortness of breath.     [provider]  cetirizine (ZYRTEC) 10 MG tablet Take 10 mg by mouth daily.  08/24/18   [provider]  gabapentin (NEURONTIN) 300 MG capsule Take 300 mg by mouth at bedtime.  08/24/18   [provider]  loperamide (IMODIUM) 2 MG capsule Take 1 capsule (2 mg total) by mouth 4 (four) times daily as needed for diarrhea or loose stools. 10/15/20   Hazel Sams, PA-C  Multiple Vitamin (MULTIVITAMIN WITH MINERALS) TABS tablet Take 1 tablet by mouth daily.    [provider]  ondansetron (ZOFRAN ODT) 8 MG disintegrating tablet Take 1 tablet (8 mg total) by mouth every 8 (eight) hours as needed for nausea or vomiting. 10/15/20   Hazel Sams, PA-C  promethazine (PHENERGAN) 25 MG tablet Take 1 tablet (25 mg total) by mouth every 6 (six) hours as needed for nausea or vomiting. 07/09/20   Hazel Sams, PA-C  Skin Protectants, Misc. (EUCERIN) cream Apply 1 application topically as needed (ezcema).    [provider]  atorvastatin (LIPITOR) 10 MG tablet TK 1 T PO ONCE D HS 09/05/18 01/08/20  [provider]  furosemide (LASIX) 20 MG tablet  Take 10 mg by mouth daily.  08/24/18 01/08/20  [provider]    Allergies    Flagyl [metronidazole hcl]  Review of Systems   Review of Systems  Constitutional:  Positive for chills, fatigue and fever.  HENT:  Positive for ear pain and sore throat. Negative for congestion.   Respiratory:  Positive for cough, shortness of breath and wheezing.   Cardiovascular:  Positive for chest pain. Negative for leg swelling.  Gastrointestinal:  Positive for vomiting (post tussive). Negative for abdominal pain and nausea.  Genitourinary:  Negative for dysuria.  Musculoskeletal:  Positive for back pain.  Neurological:  Negative for syncope.  All  other systems reviewed and are negative.  Physical Exam Updated Vital Signs BP 121/84   Pulse 68   Temp 98.4 F (36.9 C) (Oral)   Resp 16   Ht 5\' 2"  (1.575 m)   Wt 81.6 kg   LMP 12/25/2020   SpO2 98%   BMI 32.92 kg/m   Physical Exam Vitals and nursing note reviewed.  Constitutional:      General: She is not in acute distress.    Appearance: She is well-developed. She is not toxic-appearing.  HENT:     Head: Normocephalic and atraumatic.     Right Ear: Ear canal normal. Tympanic membrane is not perforated, erythematous, retracted or bulging.     Left Ear: Ear canal normal. Tympanic membrane is not perforated, erythematous, retracted or bulging.     Ears:     Comments: No mastoid erythema/swelling/tenderness.     Nose:     Right Sinus: No maxillary sinus tenderness or frontal sinus tenderness.     Left Sinus: No maxillary sinus tenderness or frontal sinus tenderness.     Mouth/Throat:     Pharynx: Uvula midline. No oropharyngeal exudate or posterior oropharyngeal erythema.     Comments: Posterior oropharynx is symmetric appearing. Patient tolerating own secretions without difficulty. No trismus. No drooling. No hot potato voice. No swelling beneath the tongue, submandibular compartment is soft.  Eyes:     General:        Right eye: No discharge.        Left eye: No discharge.     Conjunctiva/sclera: Conjunctivae normal.     Pupils: Pupils are equal, round, and reactive to light.  Cardiovascular:     Rate and Rhythm: Normal rate and regular rhythm.     Heart sounds: No murmur heard. Pulmonary:     Effort: Pulmonary effort is normal. No respiratory distress.     Breath sounds: No wheezing, rhonchi or rales.     Comments: Decreased breath sounds @ the bases- R>L Chest:     Chest wall: Tenderness (right anterior and more so posterior chest wall without overlying skin changes- reproduces patient's pain) present.  Abdominal:     General: There is no distension.      Palpations: Abdomen is soft.     Tenderness: There is no abdominal tenderness.  Musculoskeletal:        General: No tenderness.     Cervical back: Normal range of motion and neck supple. No edema or rigidity.     Right lower leg: No edema.     Left lower leg: No edema.  Lymphadenopathy:     Cervical: No cervical adenopathy.  Skin:    General: Skin is warm and dry.     Findings: No rash.  Neurological:     Mental Status: She is alert.     Comments:  Clear speech.   Psychiatric:        Behavior: Behavior normal.    ED Results / Procedures / Treatments   Labs (all labs ordered are listed, but only abnormal results are displayed) Labs Reviewed - No data to display  EKG None  Radiology No results found.  Procedures Procedures   Medications Ordered in ED Medications  ketorolac (TORADOL) injection 30 mg (has no administration in time range)  ipratropium-albuterol (DUONEB) 0.5-2.5 (3) MG/3ML nebulizer solution 3 mL (3 mLs Nebulization Given 01/01/21 1444)    ED Course  I have reviewed the triage vital signs and the nursing notes.  Pertinent labs & imaging results that were available during my care of the patient were reviewed by me and considered in my medical decision making (see chart for details).    MDM Rules/Calculators/A&P                           Patient presents to the ED with complaints of persistent respiratory sxs & chest discomfort. Nontoxic, vitals WNL.  Additional history obtained:  Additional history obtained from chart review & nursing note review.  UC visit 12/30/20.  Covid, flu, and preg test @ UC- negative. CXR with  Questionable patchy infiltrate right lung base  @ UC  ED Course:  Exam is without signs of AOM, AOE, or mastoiditis. Oropharyngeal exam is benign. No sinus tenderness. No meningeal signs.. Abdomen nontender w/o peritoneal signs, tolerating PO.   No wheezing on exam, however trialed duoneb in the ED without much symptomatic change.  Given significant wheezing on initial or repeat exam and patient with DM do not feel that steroids are indicated for asthma exacerbation at this time. CXR @ UC consistent w/ pneumonia, patient on appropriate abx therapy for this currently, she is not hypoxic with ambulation in the ED- maintaining sats @ 98-100% on RA and is not septic or toxic appearing to necessitate admission. Low risk wells feel that PE is less likely. Feel her pain is likely MSK/pleurisy, her chest/back discomfort is reproducible with palpation on exam, will tx with robaxin & naproxen and trial alternative antitussive medication but overall appears appropriate for discharge home with PCP follow up. I discussed  treatment plan, need for follow-up, and return precautions with the patient. Provided opportunity for questions, patient confirmed understanding and is in agreement with plan.   Findings and plan of care discussed with supervising physician Dr. Ralene Bathe who is in agreement.   Portions of this note were generated with [employer]. Dictation errors may occur despite best attempts at proofreading.  Final Clinical Impression(s) / ED Diagnoses Final diagnoses:  Cough    Rx / DC Orders ED Discharge Orders          Ordered    methocarbamol (ROBAXIN) 500 MG tablet  Every 8 hours PRN        01/01/21 1529    naproxen (NAPROSYN) 500 MG tablet  2 times daily PRN        01/01/21 1529    promethazine-dextromethorphan (PROMETHAZINE-DM) 6.25-15 MG/5ML syrup  Every 6 hours PRN        01/01/21 1529             Silveria Botz, Glynda Jaeger, PA-C 01/01/21 1543    Quintella Reichert, MD 01/01/21 [phone]

## 2021-01-01 NOTE — ED Notes (Signed)
Pt provided discharge instructions and prescription information. Pt was given the opportunity to ask questions and questions were answered. Discharge signature not obtained in the setting of the COVID-19 pandemic in order to reduce high touch surfaces.  ° °

## 2021-01-01 NOTE — Discharge Instructions (Addendum)
You were seen in the Er today for continued cough, trouble breathing, and chest pain. We suspect that your symptoms are related to pneumonia. We would like you to continue your antibiotics. We are also sending you home with the following medications:   - Naproxen is a nonsteroidal anti-inflammatory medication that will help with pain and swelling. Be sure to take this medication as prescribed with food, 1 pill every 12 hours,  It should be taken with food, as it can cause stomach upset, and more seriously, stomach bleeding. Do not take other nonsteroidal anti-inflammatory medications with this such as Advil, Motrin, Aleve, Mobic, Goodie Powder, or Motrin.    - Robaxin is the muscle relaxer I have prescribed, this is meant to help with muscle tightness. Be aware that this medication may make you drowsy therefore the first time you take this it should be at a time you are in an environment where you can rest. Do not drive or operate heavy machinery when taking this medication. Do not drink alcohol or take other sedating medications with this medicine such as narcotics or benzodiazepines.   - Promethazine & dextromethorphan- take 59mL every 6 hours as needed for coughing.   You make take Tylenol per over the counter dosing with these medications.   We have prescribed you new medication(s) today. Discuss the medications prescribed today with your pharmacist as they can have adverse effects and interactions with your other medicines including over the counter and prescribed medications. Seek medical evaluation if you start to experience new or abnormal symptoms after taking one of these medicines, seek care immediately if you start to experience difficulty breathing, feeling of your throat closing, facial swelling, or rash as these could be indications of a more serious allergic reaction  Follow up with primary care within 3 days. Return to the Er for new or worsneing symptoms including but not limited to new  or worsening pain, increased work of breathing, wheezing not alleviated by inhaler, coughing up blood clots, passing out, or any other concerns.

## 2021-01-28 ENCOUNTER — Emergency Department (HOSPITAL_COMMUNITY): Payer: Medicaid Other

## 2021-01-28 ENCOUNTER — Encounter (HOSPITAL_COMMUNITY): Payer: Self-pay

## 2021-01-28 ENCOUNTER — Other Ambulatory Visit: Payer: Self-pay

## 2021-01-28 ENCOUNTER — Emergency Department (HOSPITAL_COMMUNITY)
Admission: EM | Admit: 2021-01-28 | Discharge: 2021-01-29 | Disposition: A | Discharge status: Home / Self Care | Payer: Medicaid Other | Attending: Emergency Medicine | Admitting: Emergency Medicine

## 2021-01-28 DIAGNOSIS — R0602 Shortness of breath: Secondary | ICD-10-CM | POA: Diagnosis present

## 2021-01-28 DIAGNOSIS — U071 COVID-19: Secondary | ICD-10-CM | POA: Diagnosis not present

## 2021-01-28 DIAGNOSIS — E119 Type 2 diabetes mellitus without complications: Secondary | ICD-10-CM | POA: Insufficient documentation

## 2021-01-28 DIAGNOSIS — J45909 Unspecified asthma, uncomplicated: Secondary | ICD-10-CM | POA: Diagnosis not present

## 2021-01-28 LAB — CBG MONITORING, ED: Glucose-Capillary: 86 mg/dL (ref 70–99)

## 2021-01-28 IMAGING — CR DG CHEST 2V
2 series · 2 of 2 positions shown · non-contrast
Comparison: None.

CLINICAL DATA: Cough

EXAM:
CHEST - 2 VIEW

[w chest pa]
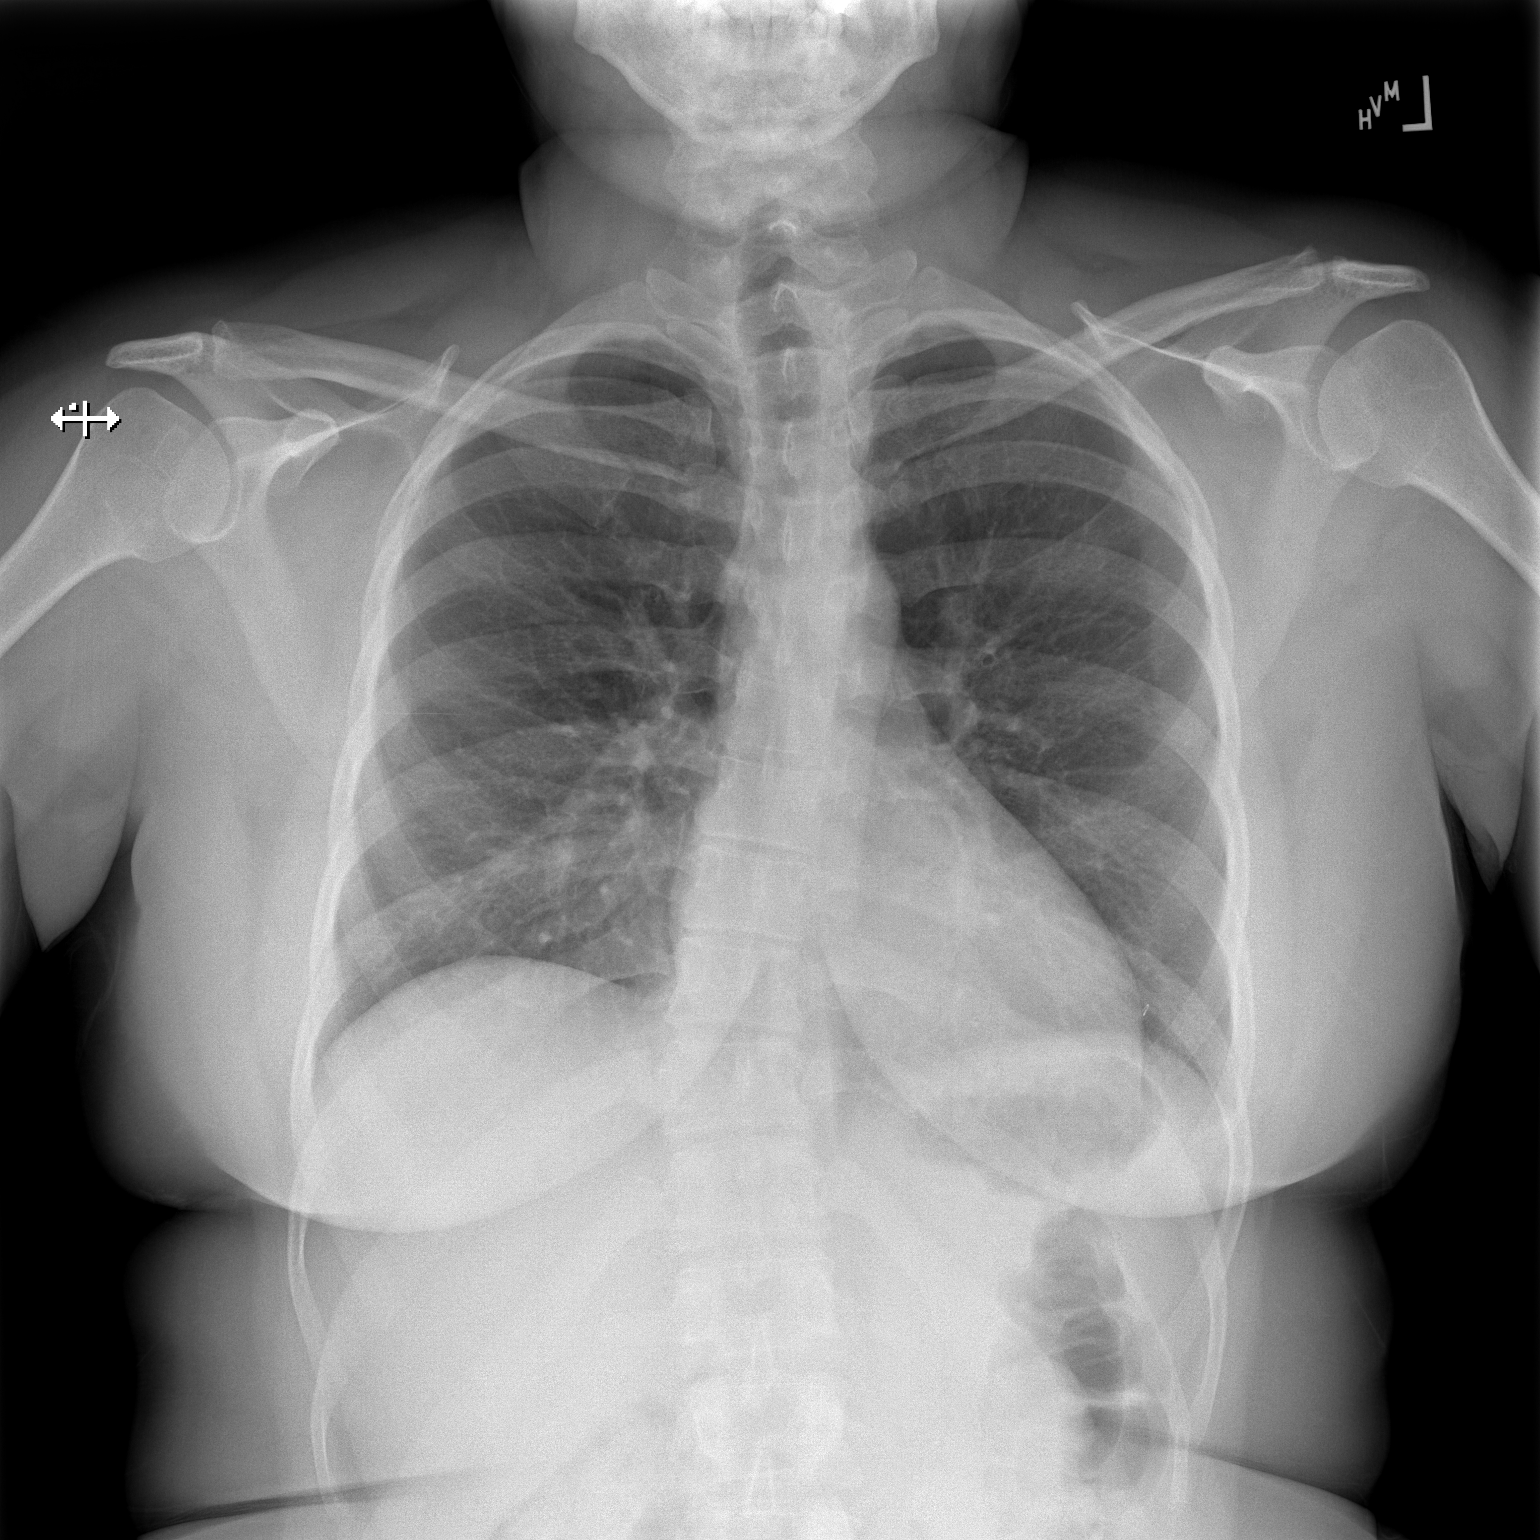

[w chest lat]
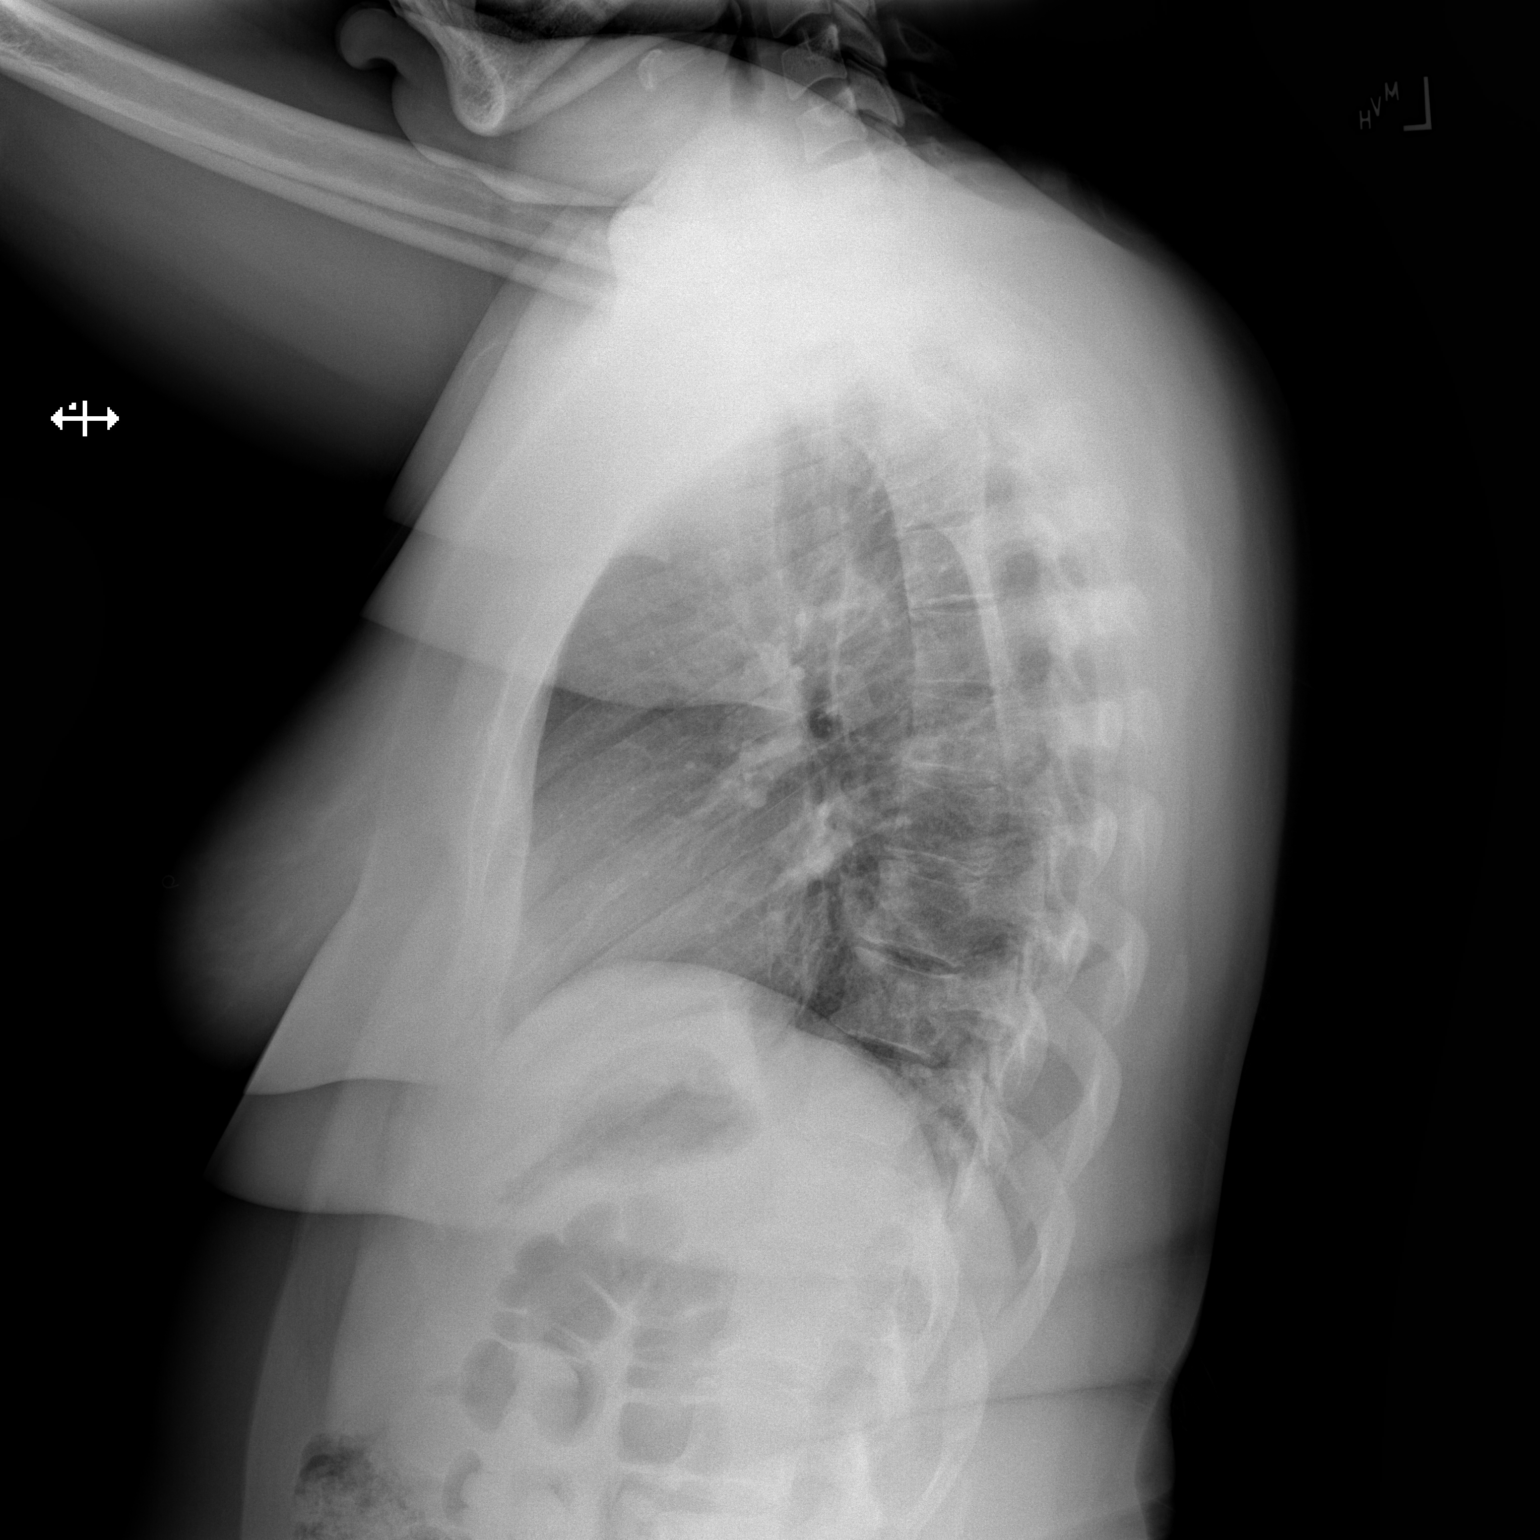

[2 of 2 positions shown; findings below may reference images not displayed]

FINDINGS: The heart size and mediastinal contours are within normal limits.
Both lungs are clear. The visualized skeletal structures are
unremarkable.
IMPRESSION: No active cardiopulmonary disease.

## 2021-01-28 NOTE — ED Notes (Signed)
Pt ambulatory in ED lobby. 

## 2021-01-28 NOTE — ED Triage Notes (Signed)
Pt reports cough, sinus pressure, mild shob, and body aches x 2 days after moving daughter into college.

## 2021-01-29 ENCOUNTER — Other Ambulatory Visit (HOSPITAL_COMMUNITY): Payer: Self-pay

## 2021-01-29 LAB — RESP PANEL BY RT-PCR (FLU A&B, COVID) ARPGX2
Influenza A by PCR: NEGATIVE
Influenza B by PCR: NEGATIVE
SARS Coronavirus 2 by RT PCR: POSITIVE — AB

## 2021-01-29 MED ORDER — ALBUTEROL SULFATE HFA 108 (90 BASE) MCG/ACT IN AERS
2.0000 | INHALATION_SPRAY | Freq: Once | RESPIRATORY_TRACT | Status: AC
  Administered 2021-01-29: 2 via RESPIRATORY_TRACT
  Filled 2021-01-29: qty 6.7

## 2021-01-29 MED ORDER — GUAIFENESIN-CODEINE 100-10 MG/5ML PO SOLN
5.0000 mL | Freq: Four times a day (QID) | ORAL | 0 refills | Status: DC | PRN
  Filled 2021-01-29: qty 120, 6d supply, fill #0

## 2021-01-29 MED ORDER — LIDOCAINE VISCOUS HCL 2 % MT SOLN
15.0000 mL | OROMUCOSAL | 0 refills | Status: DC | PRN
  Filled 2021-01-29: qty 100, 2d supply, fill #0

## 2021-01-29 MED ORDER — ACETAMINOPHEN 325 MG PO TABS
650.0000 mg | ORAL_TABLET | Freq: Once | ORAL | Status: AC
  Administered 2021-01-29: 650 mg via ORAL
  Filled 2021-01-29: qty 2

## 2021-01-29 MED ORDER — IPRATROPIUM-ALBUTEROL 0.5-2.5 (3) MG/3ML IN SOLN
3.0000 mL | Freq: Once | RESPIRATORY_TRACT | Status: AC
  Administered 2021-01-29: 3 mL via RESPIRATORY_TRACT
  Filled 2021-01-29: qty 3

## 2021-01-29 MED ORDER — ONDANSETRON 4 MG PO TBDP
4.0000 mg | ORAL_TABLET | Freq: Once | ORAL | Status: AC
  Administered 2021-01-29: 4 mg via ORAL
  Filled 2021-01-29: qty 1

## 2021-01-29 MED ORDER — LIDOCAINE VISCOUS HCL 2 % MT SOLN
15.0000 mL | Freq: Once | OROMUCOSAL | Status: AC
  Administered 2021-01-29: 15 mL via OROMUCOSAL
  Filled 2021-01-29: qty 15

## 2021-01-29 MED ORDER — MOLNUPIRAVIR EUA 200MG CAPSULE
4.0000 | ORAL_CAPSULE | Freq: Two times a day (BID) | ORAL | 0 refills | Status: AC
  Filled 2021-01-29: qty 40, 5d supply, fill #0

## 2021-01-29 MED ORDER — GUAIFENESIN-CODEINE 100-10 MG/5ML PO SOLN
5.0000 mL | Freq: Once | ORAL | Status: AC
Start: 2021-01-29 — End: 2021-01-29
  Administered 2021-01-29: 5 mL via ORAL
  Filled 2021-01-29: qty 5

## 2021-01-29 MED ORDER — AEROCHAMBER Z-STAT PLUS/MEDIUM MISC
1.0000 | Freq: Once | Status: AC
  Administered 2021-01-29: 1
  Filled 2021-01-29: qty 1

## 2021-01-29 MED ORDER — ONDANSETRON 4 MG PO TBDP
4.0000 mg | ORAL_TABLET | Freq: Three times a day (TID) | ORAL | 0 refills | Status: DC | PRN
  Filled 2021-01-29: qty 20, 7d supply, fill #0

## 2021-01-29 NOTE — ED Provider Notes (Signed)
[facility] DEPT Provider Note   CSN: [id number] Arrival date & time: 01/28/21  1939     History Chief Complaint  Patient presents with   Cough    Susan Guerrero is a 45 y.o. female with a history of asthma, diabetes mellitus, migraines who presents to the emergency department with a chief complaint of shortness of breath.  The patient reports a 3-day history of shortness of breath, intermittently productive cough, headache, sinus pressure, myalgias, and sore throat.  She reports that the cough is so severe that she is having episodes of posttussive emesis.  She denies fever, chills, vomiting not associated with coughing, abdominal pain, diarrhea, numbness, weakness, or visual changes.  She has been using her home medications and her home inhaler without improvement in her symptoms.  She did have wheezing yesterday, but reports that this did resolve today.  She reports that symptoms began the day after she helped to move her daughter into her college dorm.  She also adds that she had similar symptoms in July and was seen at urgent care and was diagnosed with pneumonia.  She was treated by antibiotics and her symptoms significantly improved, but she is not sure if they ever fully resolved.  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  The history is provided by the patient and medical records. No language interpreter was used.      Past Medical History:  Diagnosis Date   Asthma    Diabetes mellitus without complication ([place])    Ocular migraine    Varicose veins     Patient Active Problem List   Diagnosis Date Noted   Venous (peripheral) insufficiency 03/22/2018    History reviewed. No pertinent surgical history.   OB History   No obstetric history on file.     Family History  Problem Relation Age of Onset   Hypertension Father    Migraines Sister    Asthma Brother    Obesity Brother     Cancer Maternal Aunt        ovarian   Cancer Maternal Grandmother        breast    Social History   Tobacco Use   Smoking status: Never   Smokeless tobacco: Never  Vaping Use   Vaping Use: Never used  Substance Use Topics   Alcohol use: Yes    Comment: Rare    Drug use: No    Home Medications Prior to Admission medications   Medication Sig Start Date End Date Taking? Authorizing Provider  albuterol (VENTOLIN HFA) 108 (90 Base) MCG/ACT inhaler Inhale 2 puffs into the lungs every 4 (four) hours as needed for shortness of breath.     [provider]  cetirizine (ZYRTEC) 10 MG tablet Take 10 mg by mouth daily.  08/24/18   [provider]  gabapentin (NEURONTIN) 300 MG capsule Take 300 mg by mouth at bedtime.  08/24/18   [provider]  loperamide (IMODIUM) 2 MG capsule Take 1 capsule (2 mg total) by mouth 4 (four) times daily as needed for diarrhea or loose stools. 10/15/20   Hazel Sams, PA-C  methocarbamol (ROBAXIN) 500 MG tablet Take 1 tablet (500 mg total) by mouth every 8 (eight) hours as needed for muscle spasms. 01/01/21   Petrucelli, Samantha R, PA-C  Multiple Vitamin (MULTIVITAMIN WITH MINERALS) TABS tablet Take 1 tablet by mouth daily.    [provider]  naproxen (NAPROSYN) 500 MG tablet Take 1 tablet (500 mg total) by mouth 2 (two) times daily as needed for moderate pain. 01/01/21   Petrucelli, Samantha R, PA-C  ondansetron (ZOFRAN ODT) 8 MG disintegrating tablet Take 1 tablet (8 mg total) by mouth every 8 (eight) hours as needed for nausea or vomiting. 10/15/20   Hazel Sams, PA-C  promethazine (PHENERGAN) 25 MG tablet Take 1 tablet (25 mg total) by mouth every 6 (six) hours as needed for nausea or vomiting. 07/09/20   Hazel Sams, PA-C  promethazine-dextromethorphan (PROMETHAZINE-DM) 6.25-15 MG/5ML syrup Take 5 mLs by mouth every 6 (six) hours as needed for cough. 01/01/21   Petrucelli, Glynda Jaeger, PA-C  Skin Protectants, Misc.  (EUCERIN) cream Apply 1 application topically as needed (ezcema).    [provider]  atorvastatin (LIPITOR) 10 MG tablet TK 1 T PO ONCE D HS 09/05/18 01/08/20  [provider]  furosemide (LASIX) 20 MG tablet Take 10 mg by mouth daily.  08/24/18 01/08/20  [provider]    Allergies    Flagyl [metronidazole hcl]  Review of Systems   Review of Systems  Constitutional:  Negative for activity change, chills and fever.  HENT:  Positive for sinus pressure, sinus pain and sore throat. Negative for ear pain, hearing loss, mouth sores, postnasal drip and trouble swallowing.   Eyes:  Negative for visual disturbance.  Respiratory:  Positive for cough, shortness of breath and wheezing.        Post-tussive emesis  Cardiovascular:  Negative for chest pain, palpitations and leg swelling.  Gastrointestinal:  Negative for abdominal pain, anal bleeding, blood in stool, constipation, diarrhea and nausea.  Genitourinary:  Negative for dysuria.  Musculoskeletal:  Positive for myalgias. Negative for back pain.  Skin:  Negative for rash.  Allergic/Immunologic: Negative for immunocompromised state.  Neurological:  Positive for headaches. Negative for dizziness, seizures, syncope, weakness and numbness.  Psychiatric/Behavioral:  Negative for confusion.    Physical Exam Updated Vital Signs BP 106/73   Pulse 76   Temp 100.3 F (37.9 C) (Oral)   Resp 16   Ht '5\' 2"'$  (1.575 m)   Wt 81.6 kg   LMP 01/27/2021   SpO2 100%   BMI 32.92 kg/m   Physical Exam Vitals and nursing note reviewed.  Constitutional:      General: She is not in acute distress.    Appearance: She is not ill-appearing, toxic-appearing or diaphoretic.  HENT:     Head: Normocephalic.     Right Ear: Tympanic membrane, ear canal and external ear normal.     Left Ear: Tympanic membrane, ear canal and external ear normal.     Nose: Congestion present. No rhinorrhea.     Mouth/Throat:     Comments: Posterior  oropharynx is erythematous.  No exudates.  No tonsillar edema.  Uvula is midline.  Tolerating secretions without difficulty. Eyes:     Conjunctiva/sclera: Conjunctivae normal.  Cardiovascular:     Rate and Rhythm: Normal rate and regular rhythm.     Heart sounds: No murmur heard.   No friction rub. No gallop.  Pulmonary:     Effort: Pulmonary effort is normal. No respiratory distress.     Breath sounds: No stridor. No wheezing, rhonchi or rales.     Comments: Lungs are clear to auscultation bilaterally.  Good air movement throughout.  No increased work of breathing.  Patient with harsh cough noted frequently throughout exam. Chest:     Chest wall: No tenderness.  Abdominal:     General: There is no distension.     Palpations: Abdomen is soft. There is no mass.     Tenderness: There is no abdominal tenderness. There is no right CVA tenderness, left CVA tenderness, guarding or rebound.     Hernia: No hernia is present.  Musculoskeletal:     Cervical back: Neck supple.     Right lower leg: No edema.     Left lower leg: No edema.  Skin:    General: Skin is warm.     Capillary Refill: Capillary refill takes less than 2 seconds.     Coloration: Skin is not jaundiced or pale.     Findings: No rash.  Neurological:     Mental Status: She is alert.  Psychiatric:        Behavior: Behavior normal.    ED Results / Procedures / Treatments   Labs (all labs ordered are listed, but only abnormal results are displayed) Labs Reviewed  RESP PANEL BY RT-PCR (FLU A&B, COVID) [id number]  CBG MONITORING, ED    EKG None  Radiology DG Chest 2 View  Result Date: 01/28/2021 CLINICAL DATA:  Cough EXAM: CHEST - 2 VIEW COMPARISON:  None. FINDINGS: The heart size and mediastinal contours are within normal limits. Both lungs are clear. The visualized skeletal structures are unremarkable. IMPRESSION: No active cardiopulmonary disease. Electronically Signed   By: Ulyses Jarred M.D.   On: 01/28/2021 21:00     Procedures Procedures   Medications Ordered in ED Medications  ipratropium-albuterol (DUONEB) 0.5-2.5 (3) MG/3ML nebulizer solution 3 mL (has no administration in time range)  ondansetron (ZOFRAN-ODT) disintegrating tablet 4 mg (has no administration in time range)  guaiFENesin-codeine 100-10 MG/5ML solution 5 mL (has no administration in time range)  acetaminophen (TYLENOL) tablet 650 mg (has no administration in time range)    ED Course  I have reviewed the triage vital signs and the nursing notes.  Pertinent labs & imaging results that were available during my care of the patient were reviewed by me and considered in my medical decision making (see chart for details).    MDM Rules/Calculators/A&P                           45 year old female with history of asthma, diabetes mellitus, and migraines who presents to the emergency department with a 3-day history of URI symptoms.  She was treated for CAP in July and symptoms improved, but she does not feel that they ever really resolved prior to onset of her symptoms 3 days ago.  Oral temp 100.3 on arrival.  No hypoxia or tachycardia.  Labs and imaging of been reviewed and independently interpreted by me.  She has been having posttussive emesis.  However, she has no hypoglycemia.  She reports that she has been voiding normally.  Will give symptomatic management with guaifenesin codeine, Zofran and reassess.  She was having wheezing yesterday, but lungs are clear today.  Will give DuoNeb.  She did have an oral temp of 100.3.  She denies any fevers, but will give Tylenol for body aches and suspected fever.  Suspect after symptom management that patient will be appropriate for discharge to home.  Strongly suspect viral URI, especially COVID-19 given that she has had multiple sick contacts at work and was helping her daughter move into the college dorm.  Chest x-ray has been reviewed and independently interpreted by me.  Chest x-ray is clear  and there is no evidence of pneumonia.  Doubt sepsis, intra-abdominal infection, meningitis, COPD exacerbation, CHF exacerbation.  Labs have been reviewed and independently interpreted by me.  She tested positive for COVID-19.  Symptoms have been managed with Zofran, Tylenol, and guaifenesin codeine cough syrup and DuoNeb.  She was able to rest and is feeling much improved.  Viscous lidocaine given for fluid challenge and for sore throat.  She is agreeable with plan to discharge to home.  I will discharge her home with molnupiravir for COVID-19 as well as symptomatic management.  All questions answered.  She is hemodynamically stable and in no acute distress.  Safer discharge home with outpatient follow-up as needed.  ER return precautions given.  Final Clinical Impression(s) / ED Diagnoses Final diagnoses:  None    Rx / DC Orders ED Discharge Orders     None        Joanne Gavel, PA-C 01/29/21 0529    Maudie Flakes, MD 01/29/21 [phone]

## 2021-01-29 NOTE — Discharge Instructions (Addendum)
Thank you for allowing me to care for you today in the Emergency Department.   You tested positive for COVID-19 today.  You should check with your job, but most likely you cannot return to work until after day 5 of your symptoms, which would be this Saturday, August 20.  I have called you in a cough syrup that contains codeine.  Codeine is a narcotic.  You should not drive, work, or take other medications or substances that make you drowsy with this medication since it can cause you to be impaired.  Take as prescribed.  Continue to use your home Flovent as prescribed.  Please remember, this medication is not a rescue inhaler and will not help you if you are feeling more short of breath.  I have given you an albuterol inhaler and a spacer today in the emergency department.  You can use 2 puffs of this medication every 4 hours as needed for wheezing or shortness of breath since it is a rescue inhaler.  Swallow 15 mL of viscous lidocaine every 3 hours as needed for sore throat.  You can also try eating and drinking hot or cold beverages that may be easier to swallow than room temperature items to help with your sore throat.  I have sent a prescription of molnupiravir to the pharmacy.  This is AN antiviral medication that can help to reduce the symptoms of COVID since you have asthma and diabetes.  Take 4 capsules by mouth 2 times daily for the next 5 days.  Let 1 tablet of Zofran dissolve in your tongue every 8 hours as needed for nausea or vomiting. Take 650 mg of Tylenol or 600 mg of ibuprofen with food every 6 hours for body aches or fever.  You can alternate between these 2 medications every 3 hours if your pain returns.  For instance, you can take Tylenol at noon, followed by a dose of ibuprofen at 3, followed by second dose of Tylenol and 6.  You can purchase a pulse oximeter over-the-counter to monitor your oxygen levels at home.  Sometimes even when you feel short of breath your oxygen levels are  good.  Return to the emergency department if you develop respiratory distress, severe chest pain, if you pass out, if you have uncontrollable vomiting and stop making urine, or have other new, concerning symptoms.

## 2021-03-11 ENCOUNTER — Emergency Department (HOSPITAL_BASED_OUTPATIENT_CLINIC_OR_DEPARTMENT_OTHER)
Admission: EM | Admit: 2021-03-11 | Discharge: 2021-03-11 | Disposition: A | Discharge status: Home / Self Care | Payer: Medicaid Other | Attending: Emergency Medicine | Admitting: Emergency Medicine

## 2021-03-11 ENCOUNTER — Encounter (HOSPITAL_BASED_OUTPATIENT_CLINIC_OR_DEPARTMENT_OTHER): Payer: Self-pay | Admitting: Emergency Medicine

## 2021-03-11 ENCOUNTER — Emergency Department (HOSPITAL_BASED_OUTPATIENT_CLINIC_OR_DEPARTMENT_OTHER): Payer: Medicaid Other | Admitting: Radiology

## 2021-03-11 ENCOUNTER — Other Ambulatory Visit: Payer: Self-pay

## 2021-03-11 DIAGNOSIS — M542 Cervicalgia: Secondary | ICD-10-CM | POA: Diagnosis not present

## 2021-03-11 DIAGNOSIS — M546 Pain in thoracic spine: Secondary | ICD-10-CM | POA: Diagnosis not present

## 2021-03-11 DIAGNOSIS — W109XXA Fall (on) (from) unspecified stairs and steps, initial encounter: Secondary | ICD-10-CM | POA: Insufficient documentation

## 2021-03-11 DIAGNOSIS — M545 Low back pain, unspecified: Secondary | ICD-10-CM | POA: Diagnosis present

## 2021-03-11 DIAGNOSIS — E119 Type 2 diabetes mellitus without complications: Secondary | ICD-10-CM | POA: Insufficient documentation

## 2021-03-11 DIAGNOSIS — J45909 Unspecified asthma, uncomplicated: Secondary | ICD-10-CM | POA: Diagnosis not present

## 2021-03-11 DIAGNOSIS — M549 Dorsalgia, unspecified: Secondary | ICD-10-CM

## 2021-03-11 DIAGNOSIS — M25522 Pain in left elbow: Secondary | ICD-10-CM | POA: Insufficient documentation

## 2021-03-11 DIAGNOSIS — W19XXXA Unspecified fall, initial encounter: Secondary | ICD-10-CM

## 2021-03-11 LAB — PREGNANCY, URINE: Preg Test, Ur: NEGATIVE

## 2021-03-11 IMAGING — DX DG ELBOW 2V*L*
2 series · 2 of 2 positions shown · non-contrast
Comparison: None.

CLINICAL DATA: Elbow pain after fall.

EXAM:
LEFT ELBOW - 2 VIEW

[elbow ap]
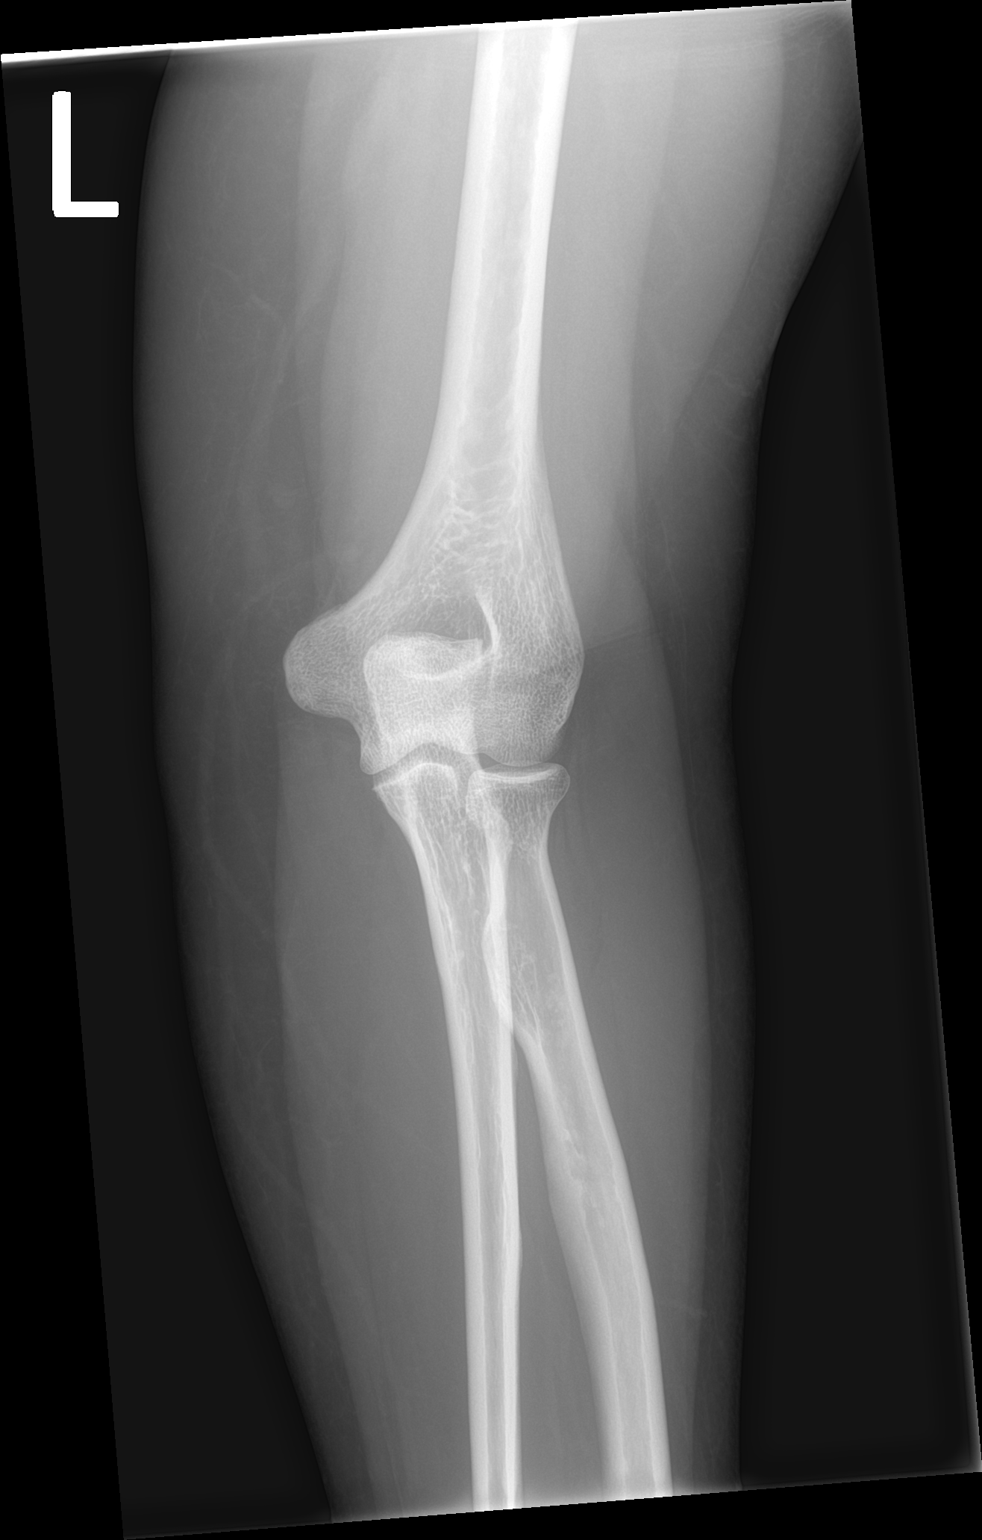

[elbow lat]
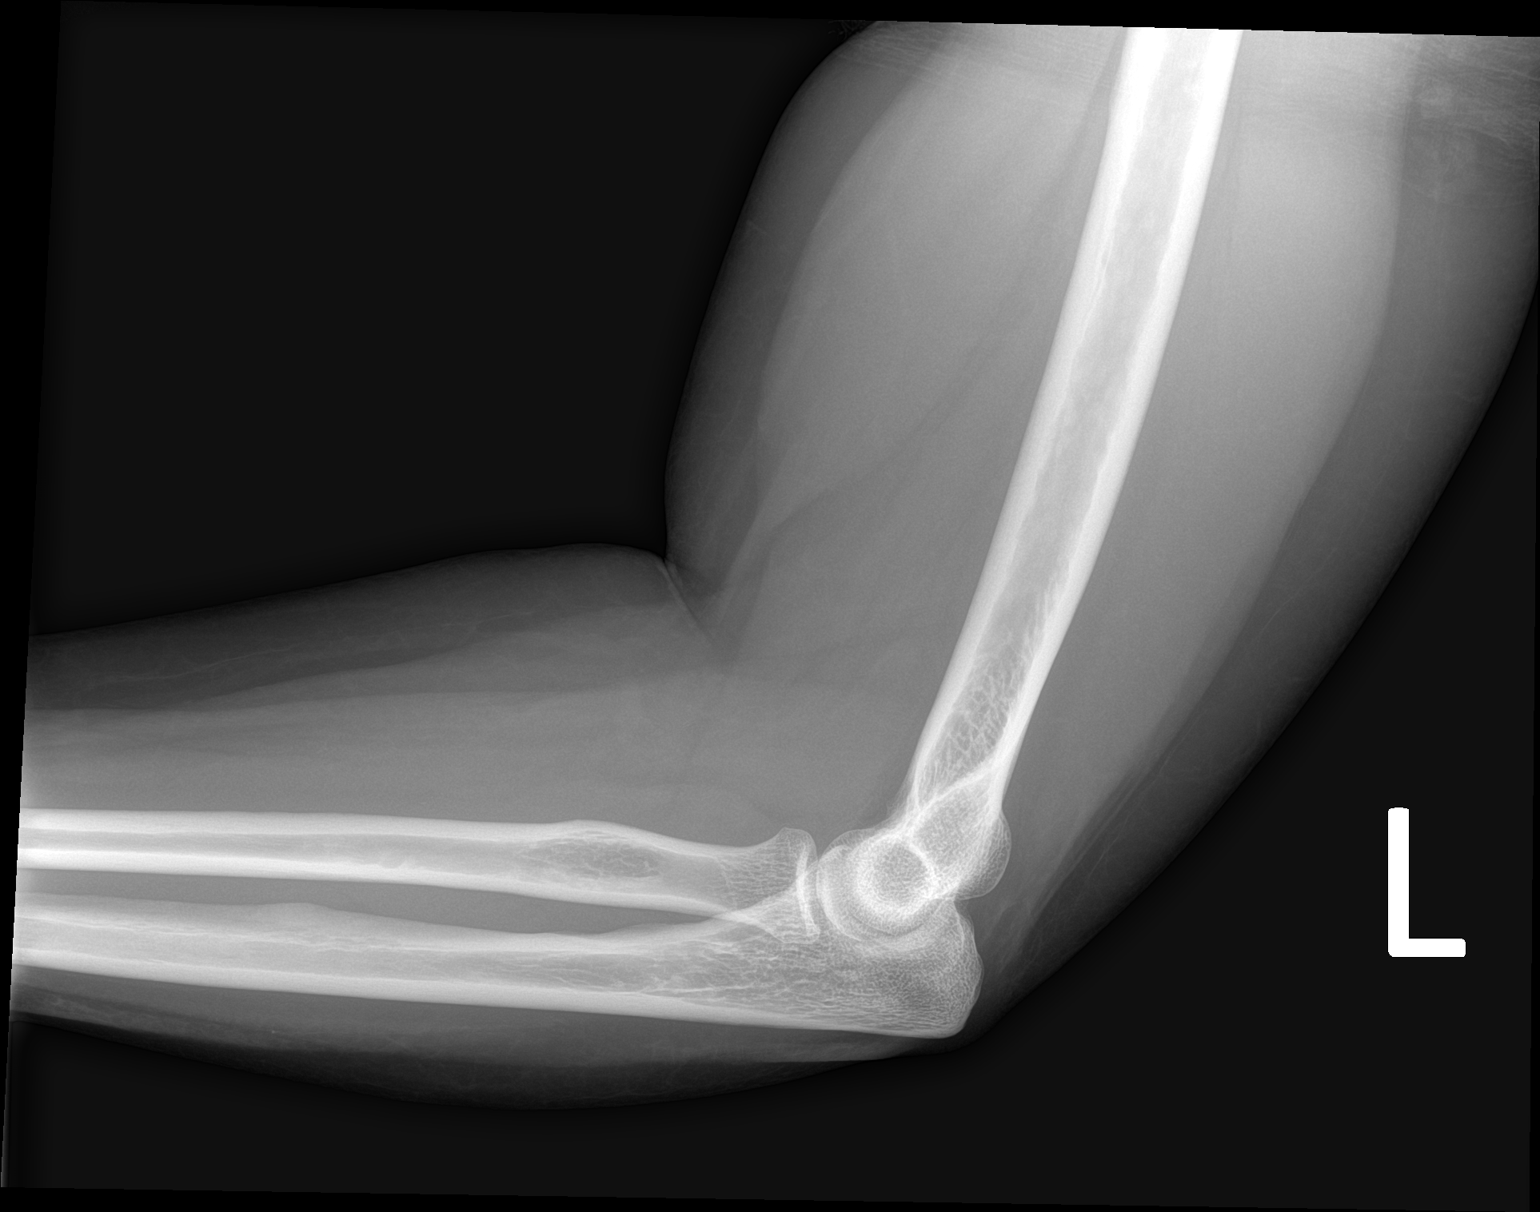

[2 of 2 positions shown; findings below may reference images not displayed]

FINDINGS: There is no evidence of fracture, dislocation, or joint effusion.
There is no evidence of arthropathy or other focal bone abnormality.
Soft tissues are unremarkable.
IMPRESSION: Negative.

## 2021-03-11 IMAGING — DX DG LUMBAR SPINE COMPLETE 4+V
5 series · 5 of 5 positions shown · non-contrast
Comparison: X-ray thoracolumbar spine [DATE]

CLINICAL DATA: Status post fall

EXAM:
THORACIC SPINE 2 VIEWS; LUMBAR SPINE - COMPLETE 4+ VIEW

[l-spine ap]
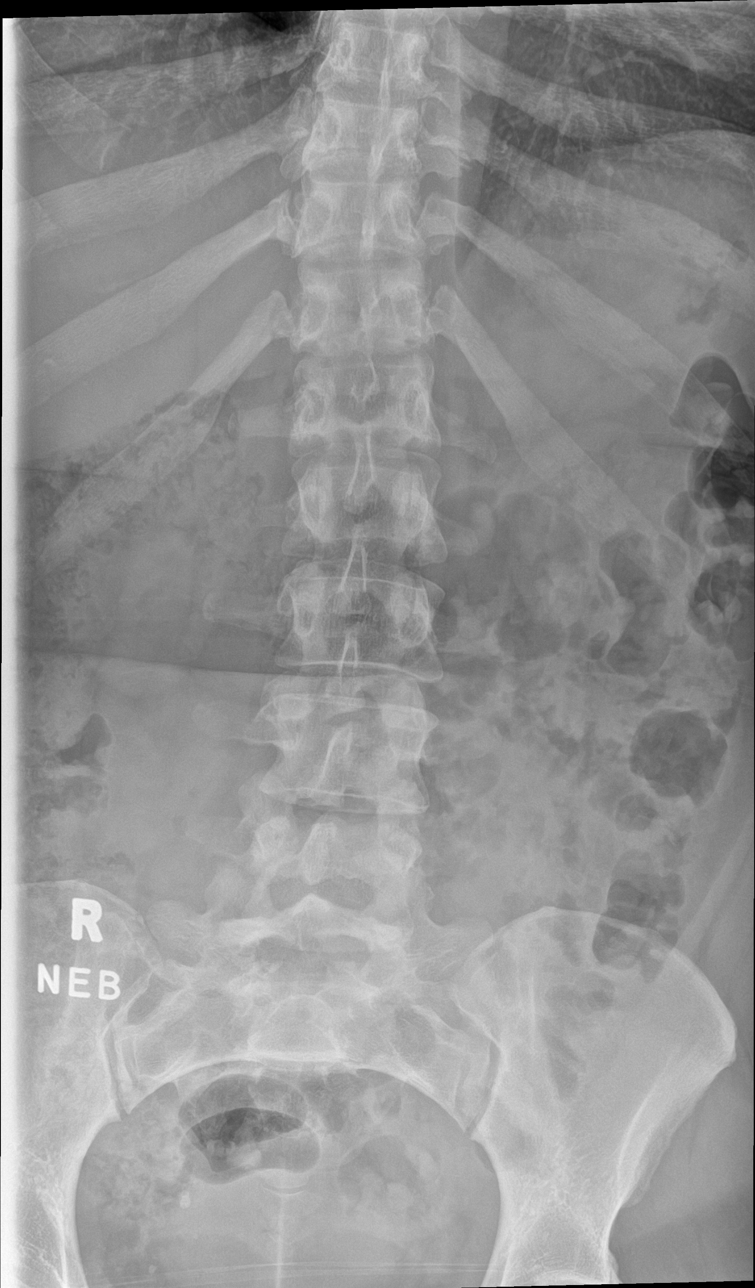

[l-spine obl (1 of 2)]
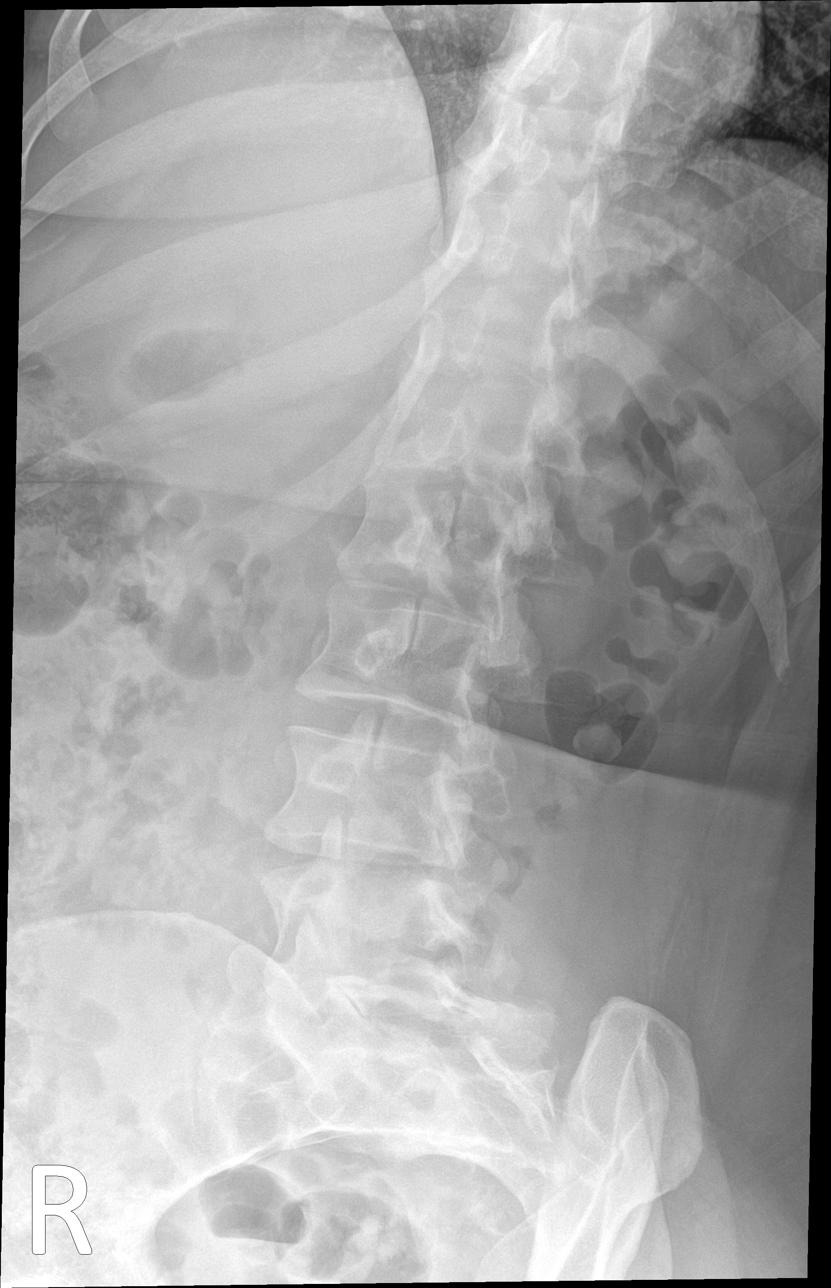

[l-spine obl (2 of 2)]
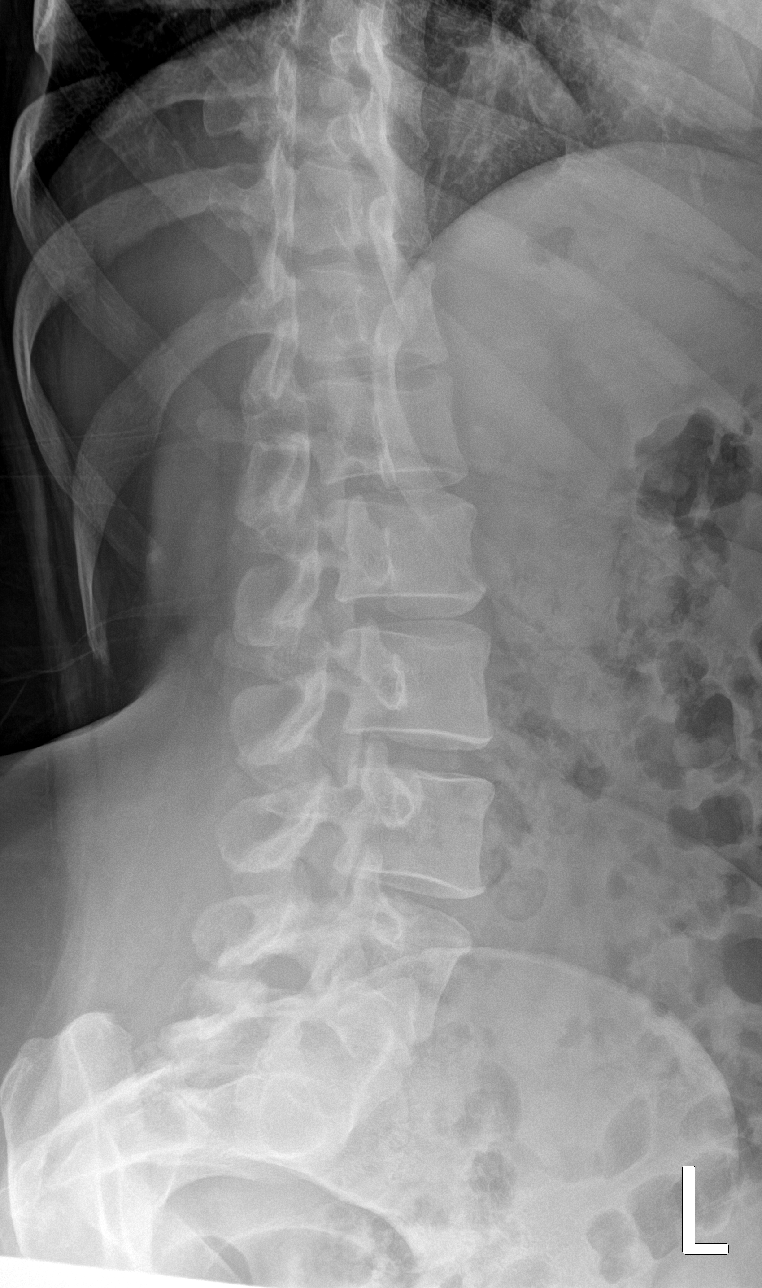

[l-spine lat]
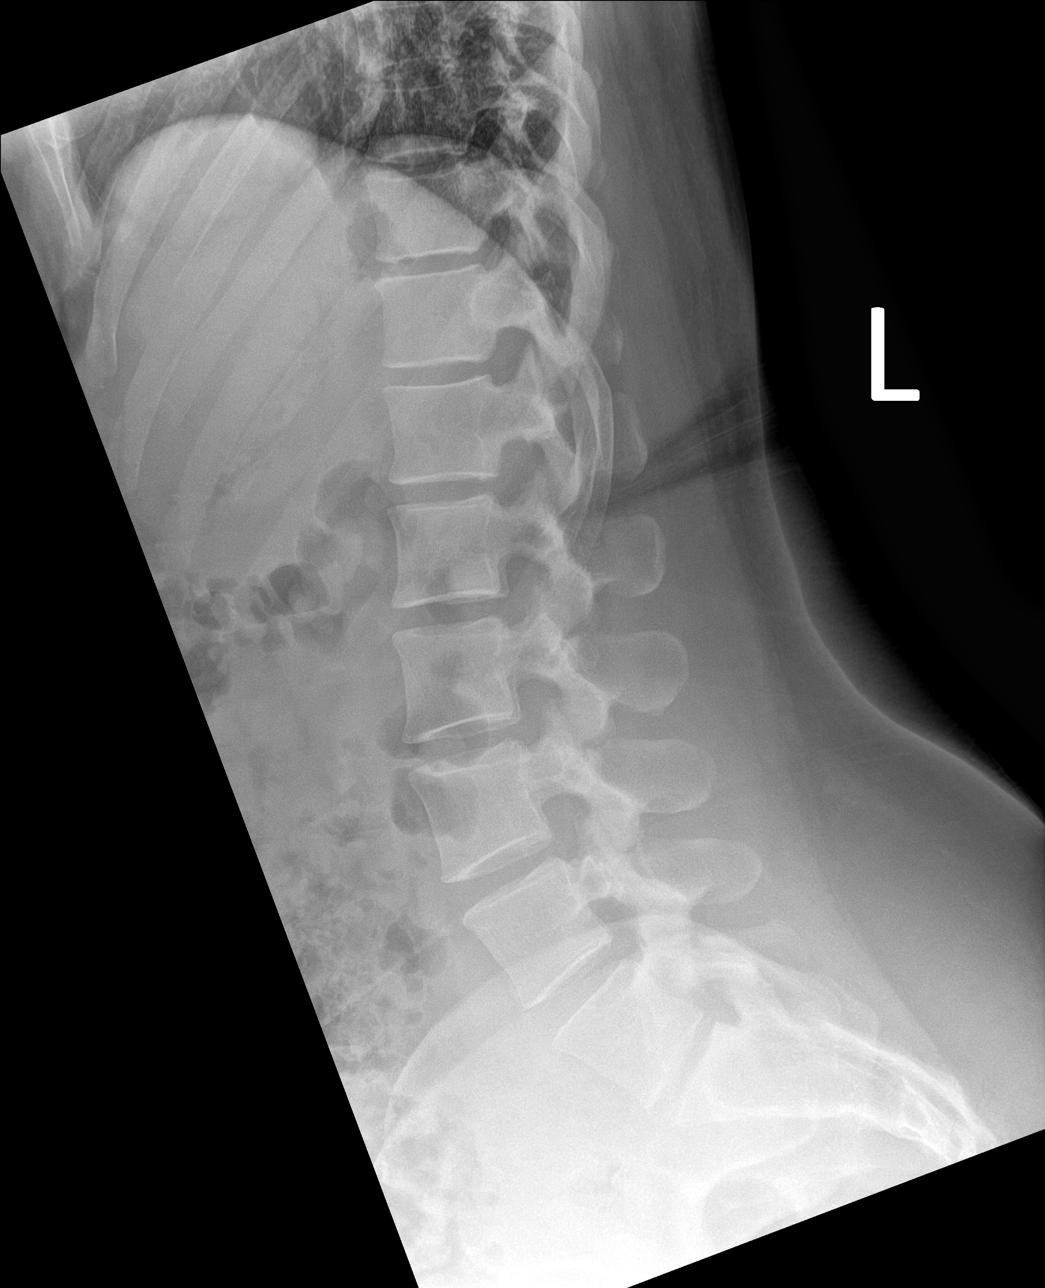

[l-spine spot]
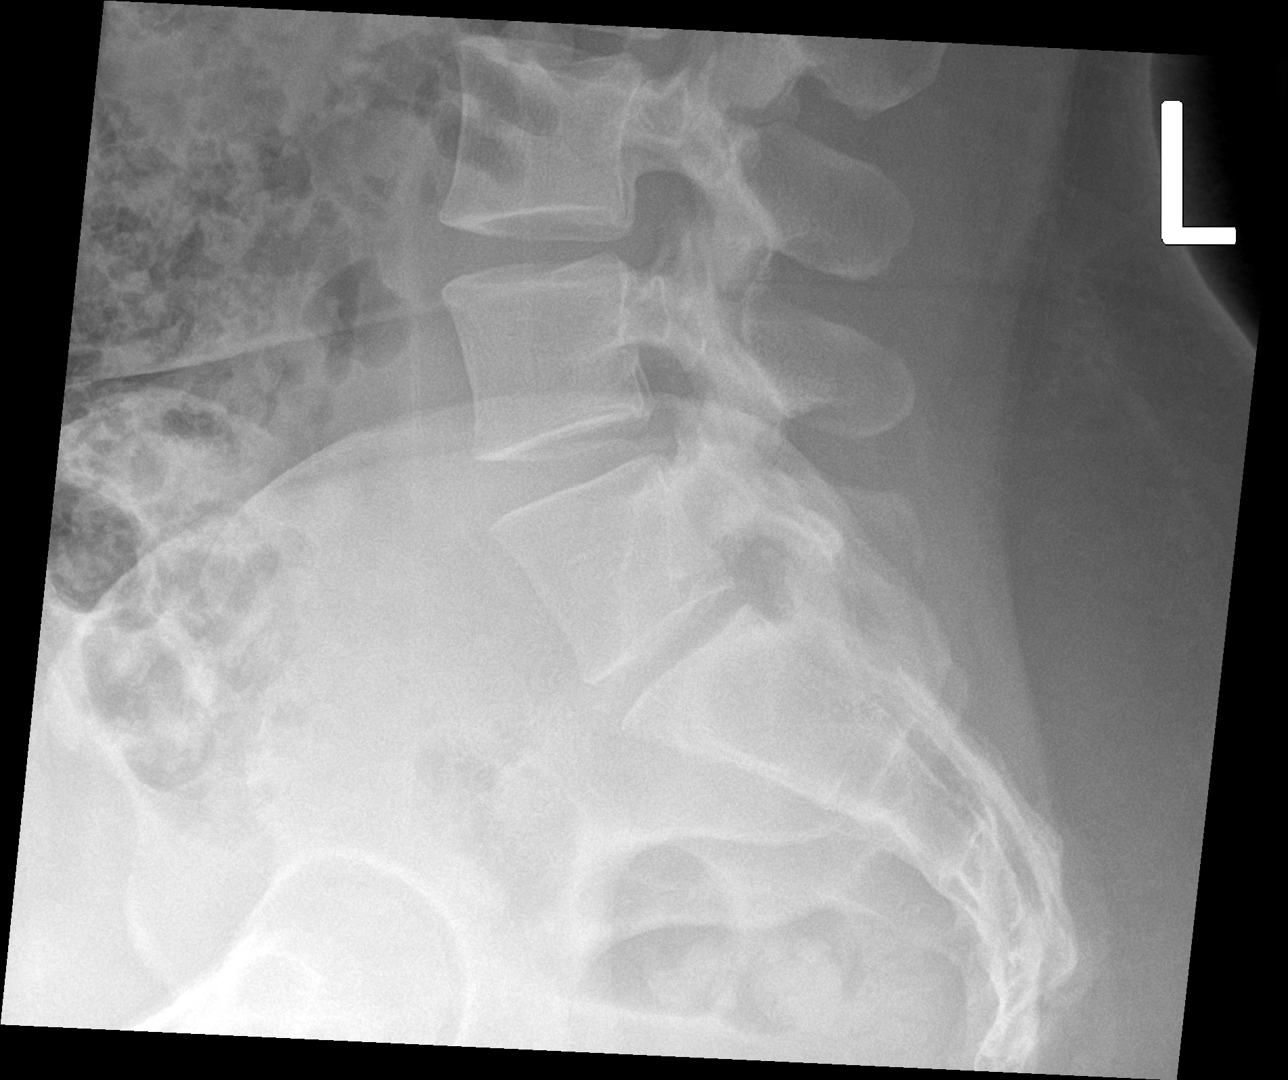

[5 of 5 positions shown; findings below may reference images not displayed]

FINDINGS: 11 rib-bearing thoracic vertebral bodies. 6 non-rib-bearing lumbar
vertebral bodies. There is no evidence of thoracolumbar spine
fracture. Alignment is normal. No other significant bone
abnormalities are identified.
IMPRESSION: Negative.

## 2021-03-11 IMAGING — DX DG THORACIC SPINE 2V
3 series · 3 of 3 positions shown · non-contrast
Comparison: X-ray thoracolumbar spine [DATE]

CLINICAL DATA: Status post fall

EXAM:
THORACIC SPINE 2 VIEWS; LUMBAR SPINE - COMPLETE 4+ VIEW

[t-spine ap]
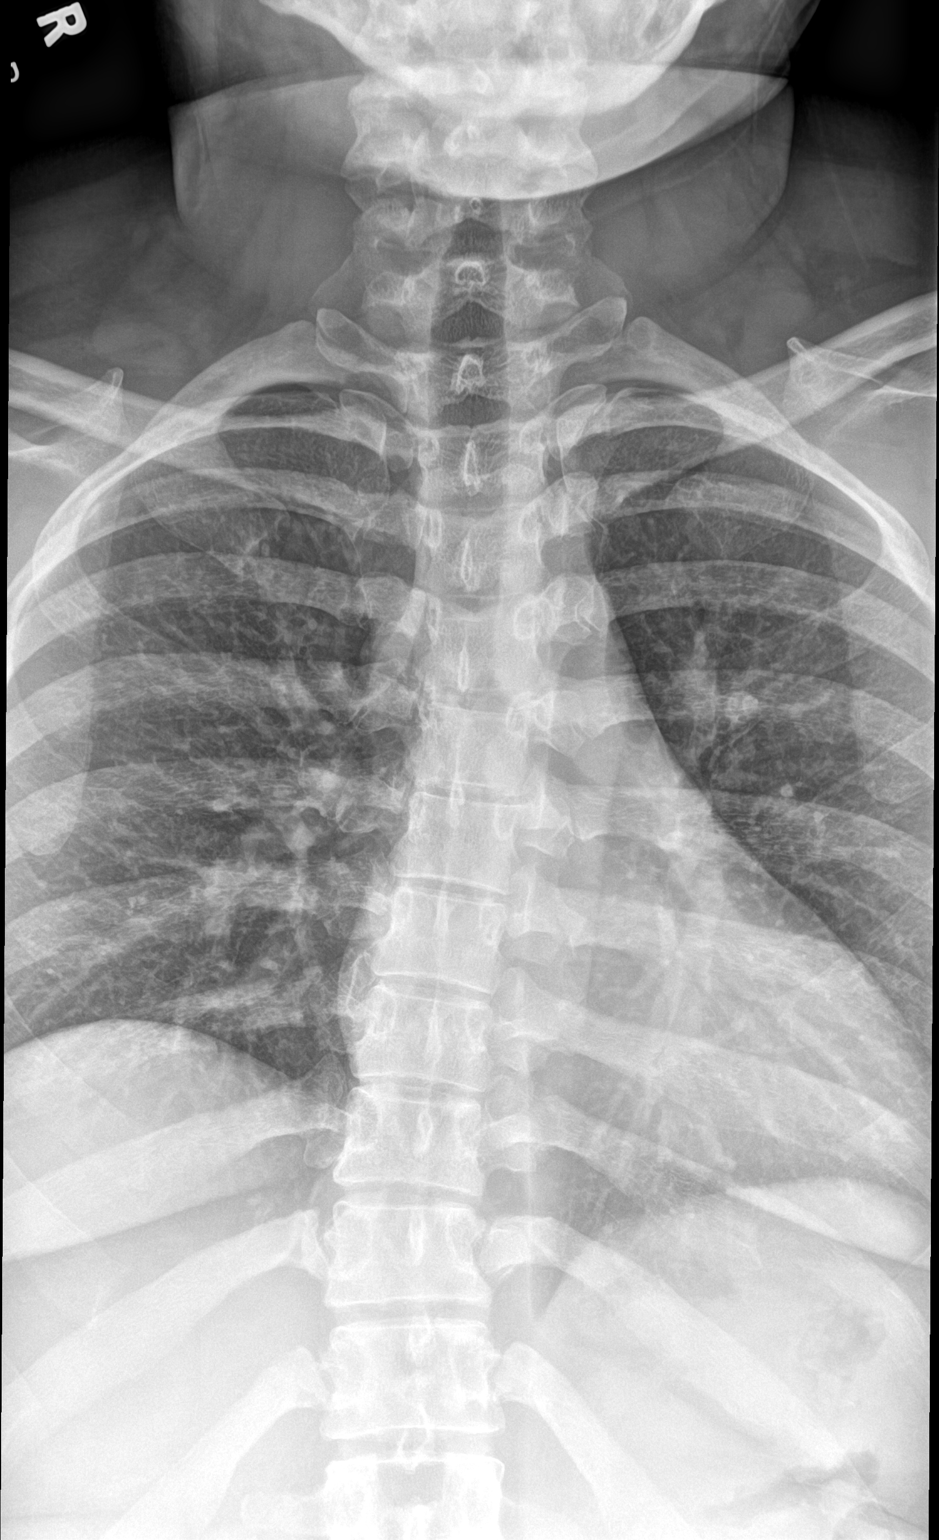

[t-spine lat]
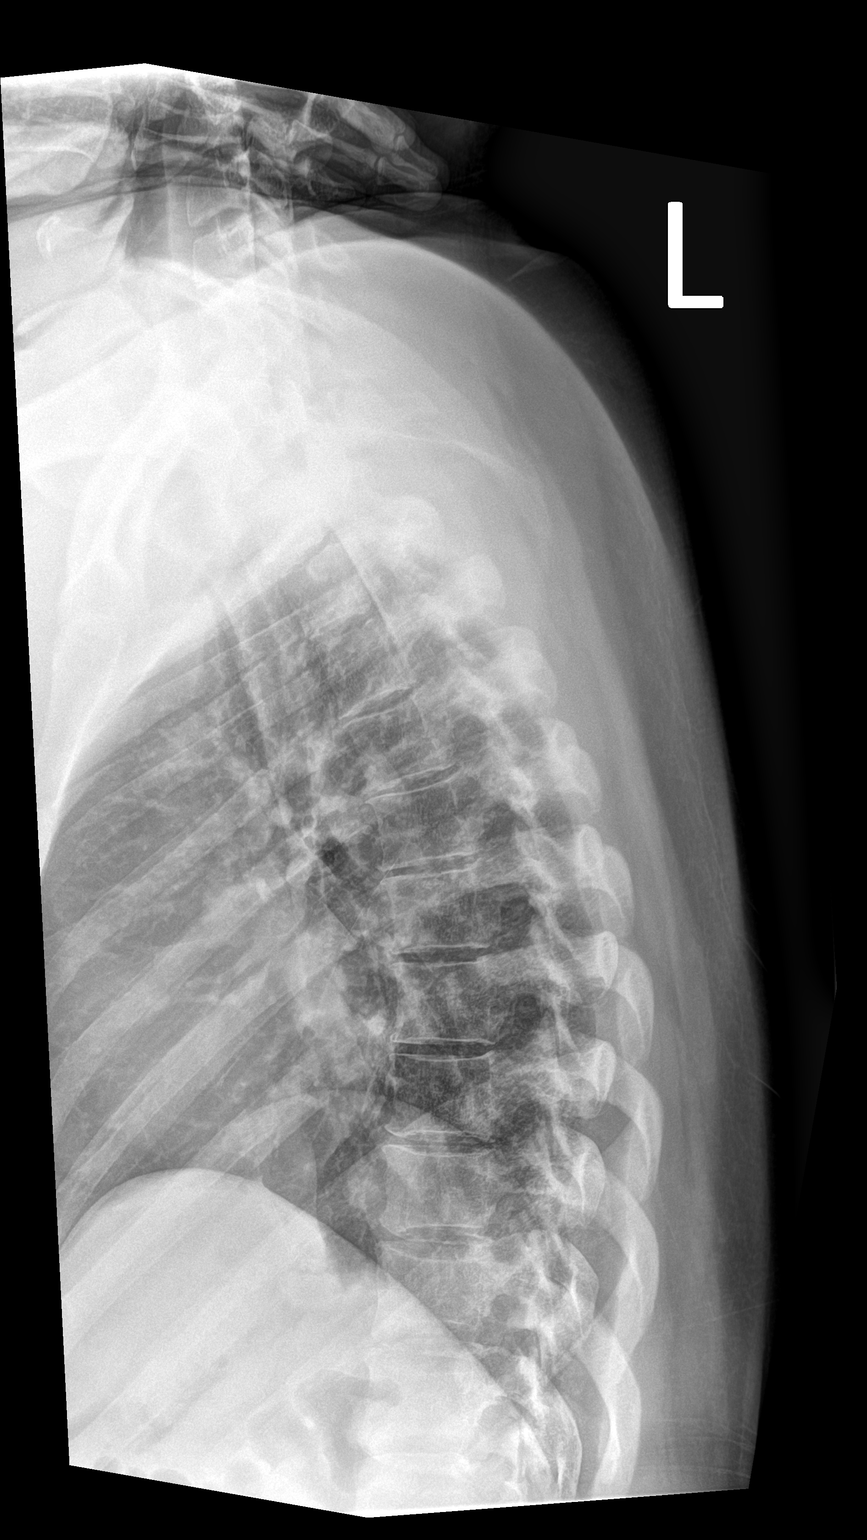

[ct-spine swimmers]
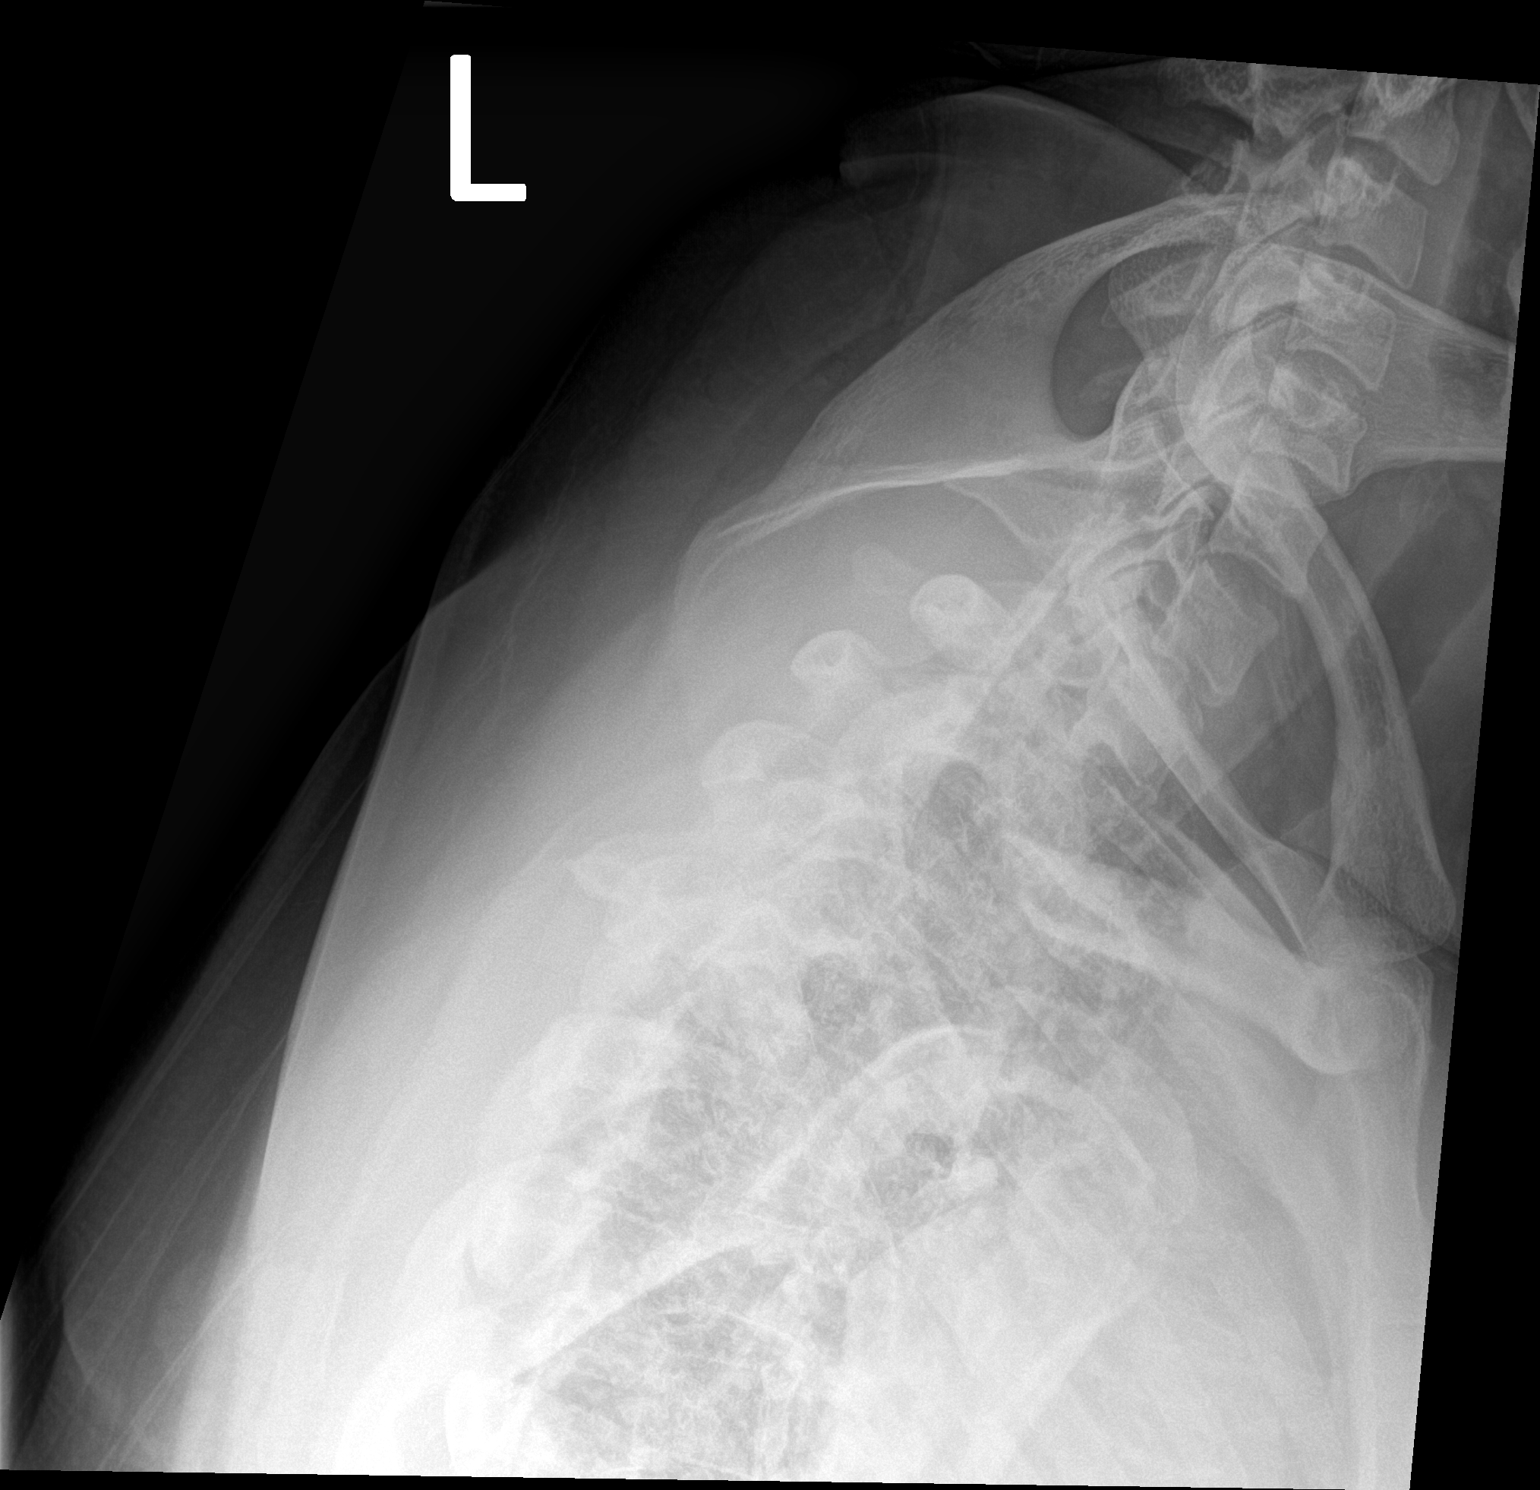

[3 of 3 positions shown; findings below may reference images not displayed]

FINDINGS: 11 rib-bearing thoracic vertebral bodies. 6 non-rib-bearing lumbar
vertebral bodies. There is no evidence of thoracolumbar spine
fracture. Alignment is normal. No other significant bone
abnormalities are identified.
IMPRESSION: Negative.

## 2021-03-11 MED ORDER — METHOCARBAMOL 500 MG PO TABS: 500.0000 mg | ORAL_TABLET | Freq: Two times a day (BID) | ORAL | 0 refills | Status: DC

## 2021-03-11 MED ORDER — NAPROXEN 500 MG PO TABS: 500.0000 mg | ORAL_TABLET | Freq: Two times a day (BID) | ORAL | 0 refills | Status: AC

## 2021-03-11 NOTE — ED Provider Notes (Signed)
[facility] EMERGENCY DEPT Provider Note   CSN: [id number] Arrival date & time: 03/11/21  1800     History Chief Complaint  Patient presents with   Back Pain    Susan Guerrero is a 45 y.o. female who presents to the ED today status post mechanical fall that occurred earlier today.  She states she was walking downstairs when she tripped and fell causing her to fall onto her back and slide down 5 steps.  No head injury or loss of consciousness and is not anticoagulated.  She has not taken anything for pain prior to arrival.  She complains of pain to her diffuse back as well as her left elbow.  No other complaints at this time.  The history is provided by the patient and medical records.      Past Medical History:  Diagnosis Date   Asthma    Diabetes mellitus without complication ([place])    Ocular migraine    Varicose veins     Patient Active Problem List   Diagnosis Date Noted   Venous (peripheral) insufficiency 03/22/2018    History reviewed. No pertinent surgical history.   OB History   No obstetric history on file.     Family History  Problem Relation Age of Onset   Hypertension Father    Migraines Sister    Asthma Brother    Obesity Brother    Cancer Maternal Aunt        ovarian   Cancer Maternal Grandmother        breast    Social History   Tobacco Use   Smoking status: Never   Smokeless tobacco: Never  Vaping Use   Vaping Use: Never used  Substance Use Topics   Alcohol use: Yes    Comment: Rare    Drug use: No    Home Medications Prior to Admission medications   Medication Sig Start Date End Date Taking? Authorizing Provider  methocarbamol (ROBAXIN) 500 MG tablet Take 1 tablet (500 mg total) by mouth 2 (two) times daily. 03/11/21  Yes Winfield Caba, PA-C  naproxen (NAPROSYN) 500 MG tablet Take 1 tablet (500 mg total) by mouth 2 (two) times daily. 03/11/21  Yes Zafir Schauer, PA-C  albuterol (VENTOLIN HFA) 108 (90 Base)  MCG/ACT inhaler Inhale 2 puffs into the lungs every 4 (four) hours as needed for shortness of breath.     [provider]  cetirizine (ZYRTEC) 10 MG tablet Take 10 mg by mouth daily.  08/24/18   [provider]  gabapentin (NEURONTIN) 300 MG capsule Take 300 mg by mouth at bedtime.  08/24/18   [provider]  guaiFENesin-codeine 100-10 MG/5ML syrup Take 5 mLs by mouth every 6 hours as needed for cough. 01/29/21   McDonald, Mia A, PA-C  lidocaine (XYLOCAINE) 2 % solution Swish & gargle 15 mLs in the mouth or throat every 3  hours as needed for mouth pain. 01/29/21   McDonald, Mia A, PA-C  loperamide (IMODIUM) 2 MG capsule Take 1 capsule (2 mg total) by mouth 4 (four) times daily as needed for diarrhea or loose stools. 10/15/20   Hazel Sams, PA-C  methocarbamol (ROBAXIN) 500 MG tablet Take 1 tablet (500 mg total) by mouth every 8 (eight) hours as needed for muscle spasms. 01/01/21   Petrucelli, Samantha R, PA-C  Multiple Vitamin (MULTIVITAMIN WITH MINERALS) TABS tablet Take 1 tablet by mouth daily.    [provider]  naproxen (NAPROSYN) 500 MG tablet Take 1 tablet (  500 mg total) by mouth 2 (two) times daily as needed for moderate pain. 01/01/21   Petrucelli, Samantha R, PA-C  ondansetron (ZOFRAN ODT) 4 MG disintegrating tablet Dissolve 1 tablet in mouth every 8  hours as needed. 01/29/21   McDonald, Mia A, PA-C  promethazine (PHENERGAN) 25 MG tablet Take 1 tablet (25 mg total) by mouth every 6 (six) hours as needed for nausea or vomiting. 07/09/20   Hazel Sams, PA-C  promethazine-dextromethorphan (PROMETHAZINE-DM) 6.25-15 MG/5ML syrup Take 5 mLs by mouth every 6 (six) hours as needed for cough. 01/01/21   Petrucelli, Glynda Jaeger, PA-C  Skin Protectants, Misc. (EUCERIN) cream Apply 1 application topically as needed (ezcema).    [provider]  atorvastatin (LIPITOR) 10 MG tablet TK 1 T PO ONCE D HS 09/05/18 01/08/20  [provider]  furosemide (LASIX)  20 MG tablet Take 10 mg by mouth daily.  08/24/18 01/08/20  [provider]    Allergies    Flagyl [metronidazole hcl] and Metronidazole  Review of Systems   Review of Systems  Constitutional:  Negative for chills and fever.  Musculoskeletal:  Positive for back pain.  Neurological:  Negative for syncope and headaches.  All other systems reviewed and are negative.  Physical Exam Updated Vital Signs BP 116/72 (BP Location: Left Arm)   Pulse 79   Temp 98.1 F (36.7 C)   Resp 17   Ht 5\' 1"  (1.549 m)   Wt 84.8 kg   LMP 02/18/2021 (Exact Date)   SpO2 100%   BMI 35.33 kg/m   Physical Exam Vitals and nursing note reviewed.  Constitutional:      Appearance: She is not ill-appearing.  HENT:     Head: Normocephalic and atraumatic.  Eyes:     Conjunctiva/sclera: Conjunctivae normal.  Neck:     Comments: Diffuse bilateral paracervical musculature TTP. ROM intact.  Cardiovascular:     Rate and Rhythm: Normal rate and regular rhythm.     Pulses: Normal pulses.  Pulmonary:     Effort: Pulmonary effort is normal.     Breath sounds: Normal breath sounds. No wheezing, rhonchi or rales.  Musculoskeletal:     Comments: Diffuse T and L midline spinal TTP with associated bilateral paraspinal musculature TTP. ROM intact to neck and back. Strength 5/5 to BUE and BLEs. Sensation intact throughout.   + Mild Ttp to L elbow. No deformity. ROM intact. 2+ radial pulse.   Skin:    General: Skin is warm and dry.     Coloration: Skin is not jaundiced.  Neurological:     Mental Status: She is alert.    ED Results / Procedures / Treatments   Labs (all labs ordered are listed, but only abnormal results are displayed) Labs Reviewed  PREGNANCY, URINE    EKG None  Radiology DG Thoracic Spine 2 View  Result Date: 03/11/2021 CLINICAL DATA:  Status post fall EXAM: THORACIC SPINE 2 VIEWS; LUMBAR SPINE - COMPLETE 4+ VIEW COMPARISON:  X-ray thoracolumbar spine 10/20/2014 FINDINGS: 11  rib-bearing thoracic vertebral bodies. 6 non-rib-bearing lumbar vertebral bodies. There is no evidence of thoracolumbar spine fracture. Alignment is normal. No other significant bone abnormalities are identified. IMPRESSION: Negative. Electronically Signed   By: Iven Finn M.D.   On: 03/11/2021 21:33   DG Lumbar Spine Complete  Result Date: 03/11/2021 CLINICAL DATA:  Status post fall EXAM: THORACIC SPINE 2 VIEWS; LUMBAR SPINE - COMPLETE 4+ VIEW COMPARISON:  X-ray thoracolumbar spine 10/20/2014 FINDINGS: 11 rib-bearing thoracic  vertebral bodies. 6 non-rib-bearing lumbar vertebral bodies. There is no evidence of thoracolumbar spine fracture. Alignment is normal. No other significant bone abnormalities are identified. IMPRESSION: Negative. Electronically Signed   By: Iven Finn M.D.   On: 03/11/2021 21:33   DG Elbow 2 Views Left  Result Date: 03/11/2021 CLINICAL DATA:  Elbow pain after fall. EXAM: LEFT ELBOW - 2 VIEW COMPARISON:  None. FINDINGS: There is no evidence of fracture, dislocation, or joint effusion. There is no evidence of arthropathy or other focal bone abnormality. Soft tissues are unremarkable. IMPRESSION: Negative. Electronically Signed   By: Iven Finn M.D.   On: 03/11/2021 21:31    Procedures Procedures   Medications Ordered in ED Medications - No data to display  ED Course  I have reviewed the triage vital signs and the nursing notes.  Pertinent labs & imaging results that were available during my care of the patient were reviewed by me and considered in my medical decision making (see chart for details).    MDM Rules/Calculators/A&P                           45 year old female who presents to the ED today status post mechanical fall complaining of back pain.  No head injury or loss of consciousness.  On arrival to the ED vitals are stable.  Patient had x-rays done of her T-spine, L-spine, elbow which have all returned negative.  Urine pregnancy test negative.   On my exam she has diffuse paraspinal musculature tenderness palpation to back as well as cervical area.  No midline C-spine tenderness palpation.  I do not feel patient needs a dedicated x-ray of her neck as I have very low suspicion that she fractured her cervical spine.  We will plan to discharge home with muscle relaxer and anti-inflammatories and PCP follow-up.  Patient in agreement with plan and stable for discharge.   This note was prepared using Dragon voice recognition software and may include unintentional dictation errors due to the inherent limitations of voice recognition software.   Final Clinical Impression(s) / ED Diagnoses Final diagnoses:  Fall, initial encounter  Acute bilateral back pain, unspecified back location    Rx / DC Orders ED Discharge Orders          Ordered    naproxen (NAPROSYN) 500 MG tablet  2 times daily        03/11/21 2208    methocarbamol (ROBAXIN) 500 MG tablet  2 times daily        03/11/21 2208             Discharge Instructions      Please pick up your medication and take as needed. DO NOT DRIVE OR DRINK ALCOHOL WHILE ON THE MUSCLE RELAXER AS IT CAN MAKE YOU DROWSY.   I would recommend taking the muscle relaxer at nighttime to help you sleep and to take the antiinflammatory during the daytime. Apply ice as needed.   Follow up with your PCP regarding ED visit  Return to the ED for any new/worsening symptoms       Eustaquio Maize, PA-C 03/11/21 [place], DO 03/12/21 2043

## 2021-03-11 NOTE — ED Triage Notes (Signed)
Pt arrives to ED with c/o of back injury. Pt reports that she slipped and fell down five steps hitting her back on each step. Pt is having lower and mid back pain. The pain is sharp and constant. Pt also reports she hit her left elbow as well which is painful. Pt denies injury to head and did not lose consciousness.

## 2021-03-11 NOTE — Discharge Instructions (Addendum)
Please pick up your medication and take as needed. DO NOT DRIVE OR DRINK ALCOHOL WHILE ON THE MUSCLE RELAXER AS IT CAN MAKE YOU DROWSY.   I would recommend taking the muscle relaxer at nighttime to help you sleep and to take the antiinflammatory during the daytime. Apply ice as needed.   Follow up with your PCP regarding ED visit  Return to the ED for any new/worsening symptoms

## 2021-09-04 ENCOUNTER — Encounter: Payer: Self-pay | Admitting: Emergency Medicine

## 2021-09-04 ENCOUNTER — Emergency Department
Admission: EM | Admit: 2021-09-04 | Discharge: 2021-09-04 | Disposition: A | Discharge status: Home / Self Care | Payer: Medicaid Other | Attending: Emergency Medicine | Admitting: Emergency Medicine

## 2021-09-04 ENCOUNTER — Other Ambulatory Visit: Payer: Self-pay

## 2021-09-04 ENCOUNTER — Emergency Department: Payer: Medicaid Other

## 2021-09-04 DIAGNOSIS — N3000 Acute cystitis without hematuria: Secondary | ICD-10-CM | POA: Insufficient documentation

## 2021-09-04 DIAGNOSIS — D72829 Elevated white blood cell count, unspecified: Secondary | ICD-10-CM | POA: Insufficient documentation

## 2021-09-04 DIAGNOSIS — R7309 Other abnormal glucose: Secondary | ICD-10-CM | POA: Insufficient documentation

## 2021-09-04 DIAGNOSIS — R112 Nausea with vomiting, unspecified: Secondary | ICD-10-CM | POA: Insufficient documentation

## 2021-09-04 DIAGNOSIS — N83201 Unspecified ovarian cyst, right side: Secondary | ICD-10-CM | POA: Insufficient documentation

## 2021-09-04 DIAGNOSIS — R1031 Right lower quadrant pain: Secondary | ICD-10-CM | POA: Diagnosis present

## 2021-09-04 LAB — COMPREHENSIVE METABOLIC PANEL
ALT: 16 U/L (ref 0–44)
AST: 22 U/L (ref 15–41)
Albumin: 3.8 g/dL (ref 3.5–5.0)
Alkaline Phosphatase: 71 U/L (ref 38–126)
Anion gap: 5 (ref 5–15)
BUN: 13 mg/dL (ref 6–20)
CO2: 25 mmol/L (ref 22–32)
Calcium: 8.7 mg/dL — ABNORMAL LOW (ref 8.9–10.3)
Chloride: 109 mmol/L (ref 98–111)
Creatinine, Ser: 0.88 mg/dL (ref 0.44–1.00)
GFR, Estimated: >60 mL/min (ref >60)
Glucose, Bld: 136 mg/dL — ABNORMAL HIGH (ref 70–99)
Potassium: 3.6 mmol/L (ref 3.5–5.1)
Sodium: 139 mmol/L (ref 135–145)
Total Bilirubin: 0.7 mg/dL (ref 0.3–1.2)
Total Protein: 7.7 g/dL (ref 6.5–8.1)

## 2021-09-04 LAB — CBC
HCT: 43.8 % (ref 36.0–46.0)
Hemoglobin: 14 g/dL (ref 12.0–15.0)
MCH: 27.7 pg (ref 26.0–34.0)
MCHC: 32 g/dL (ref 30.0–36.0)
MCV: 86.6 fL (ref 80.0–100.0)
Platelets: 268 10*3/uL (ref 150–400)
RBC: 5.06 MIL/uL (ref 3.87–5.11)
RDW: 13.2 % (ref 11.5–15.5)
WBC: 10.4 10*3/uL (ref 4.0–10.5)
nRBC: 0 % (ref 0.0–0.2)

## 2021-09-04 LAB — URINALYSIS, ROUTINE W REFLEX MICROSCOPIC
Bilirubin Urine: NEGATIVE
Glucose, UA: NEGATIVE mg/dL
Ketones, ur: NEGATIVE mg/dL
Nitrite: NEGATIVE
Protein, ur: 30 mg/dL — AB
RBC / HPF: >50 RBC/hpf — ABNORMAL HIGH (ref 0–5)
Specific Gravity, Urine: 1.029 (ref 1.005–1.030)
pH: 7 (ref 5.0–8.0)

## 2021-09-04 LAB — POC URINE PREG, ED: Preg Test, Ur: NEGATIVE

## 2021-09-04 LAB — LIPASE, BLOOD: Lipase: 35 U/L (ref 11–51)

## 2021-09-04 IMAGING — CT CT RENAL STONE PROTOCOL
2 of 4 series · 15 of 46 positions shown, 17 images · non-contrast
Comparison: [DATE].

CLINICAL DATA: Left lower quadrant abdominal pain.



[Series 2: stone full standard · axial · 0.71mm/px · z∈[-586,-146]mm · 12 of 97 slices shown, 14 images]
[im 5/97  soft-tissue]
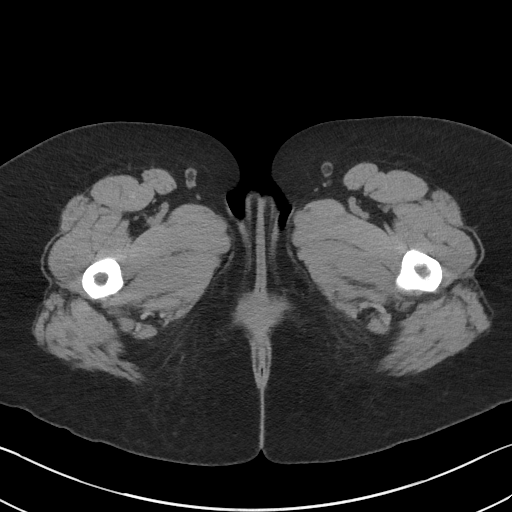
[im 5/97  bone]
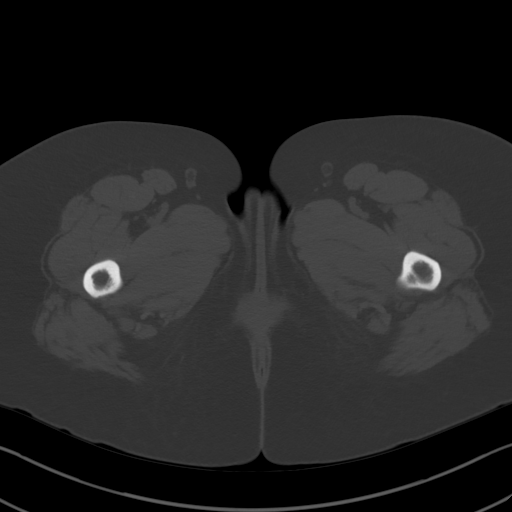
[im 13/97  soft-tissue]
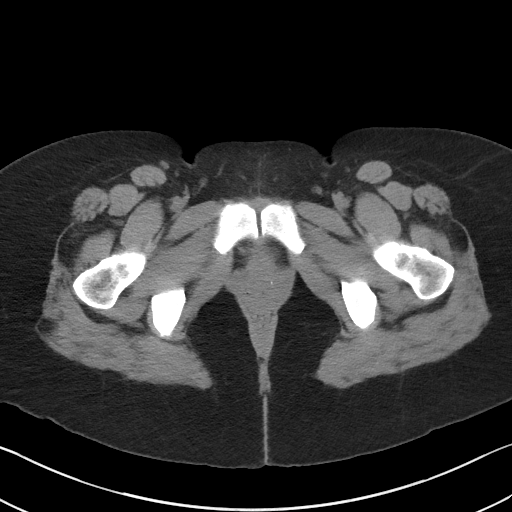
[im 21/97  soft-tissue]
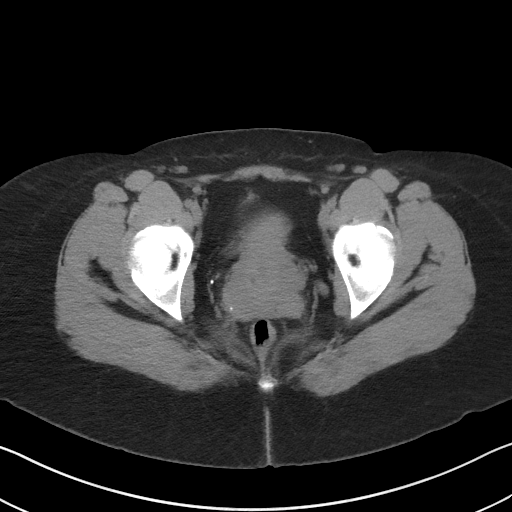
[im 29/97  soft-tissue]
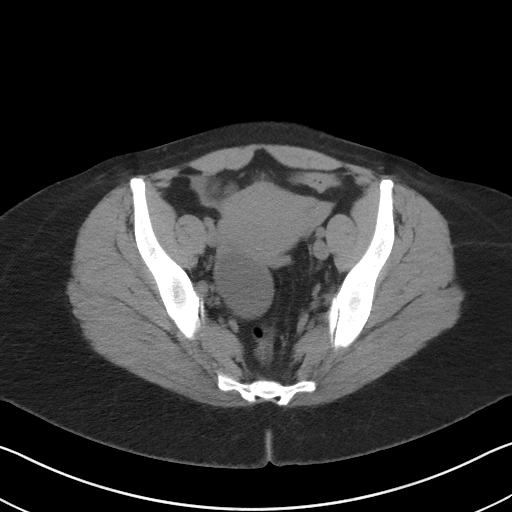
[im 37/97  soft-tissue]
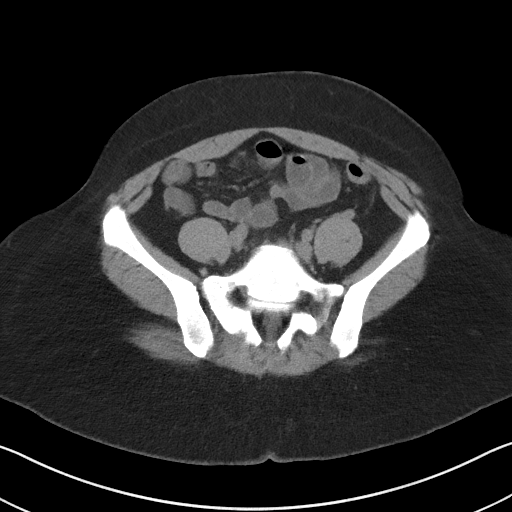
[im 45/97  soft-tissue]
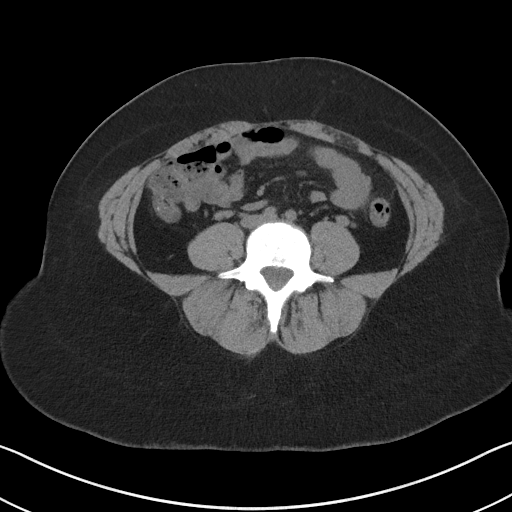
[im 53/97  soft-tissue]
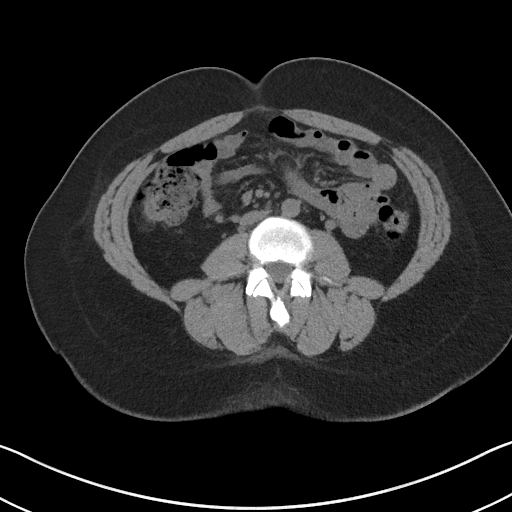
[im 61/97  soft-tissue]
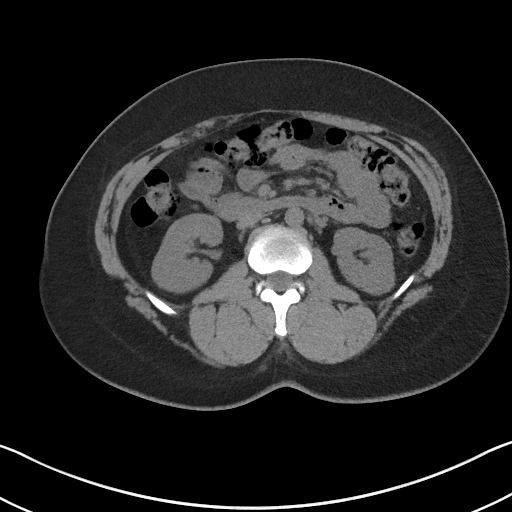
[im 69/97  soft-tissue]
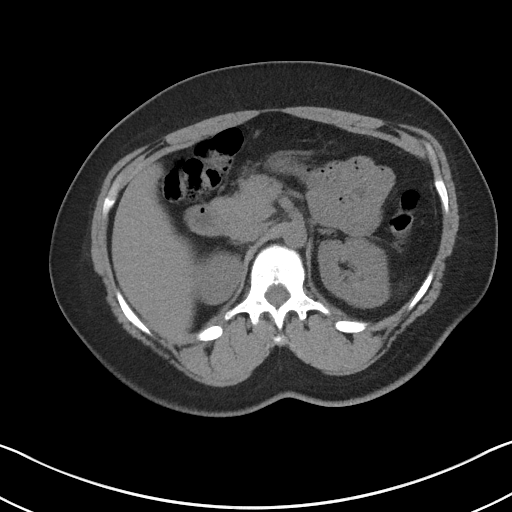
[im 69/97  bone]
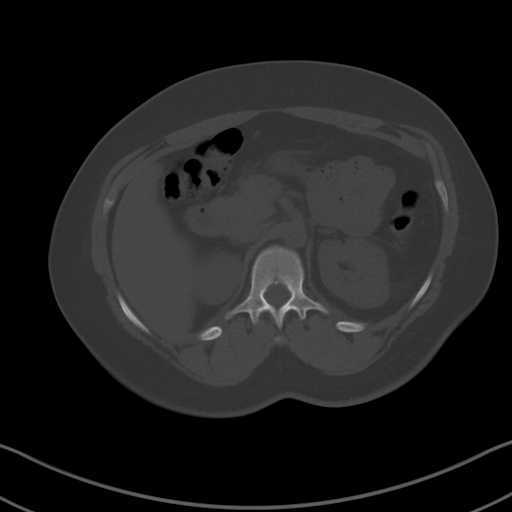
[im 77/97  soft-tissue]
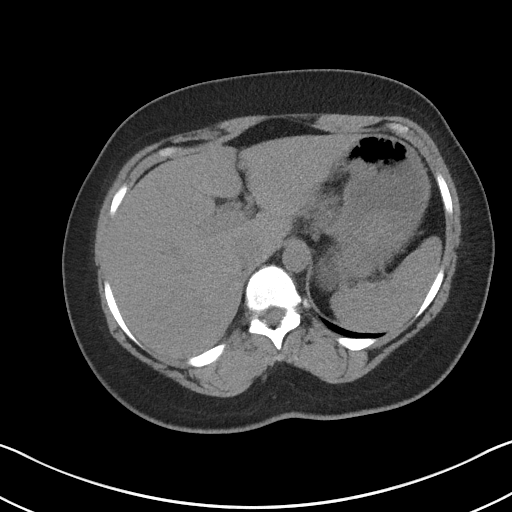
[im 85/97  soft-tissue]
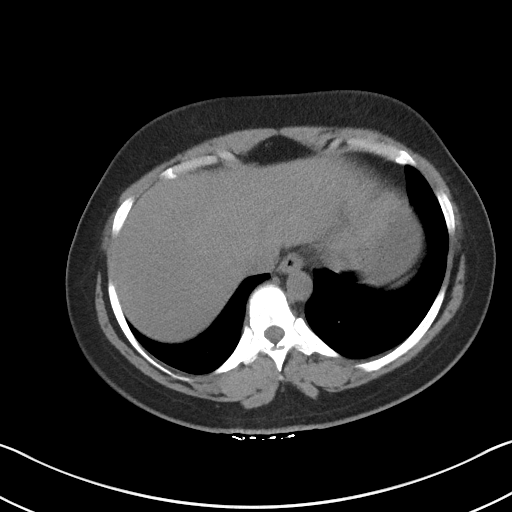
[im 93/97  soft-tissue]
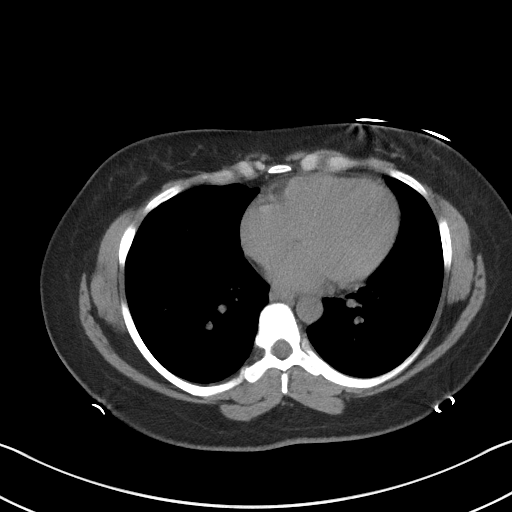

[Series 5: coronal · coronal · 0.71mm/px · 3 of 147 slices shown]
[im 49/147  soft-tissue]
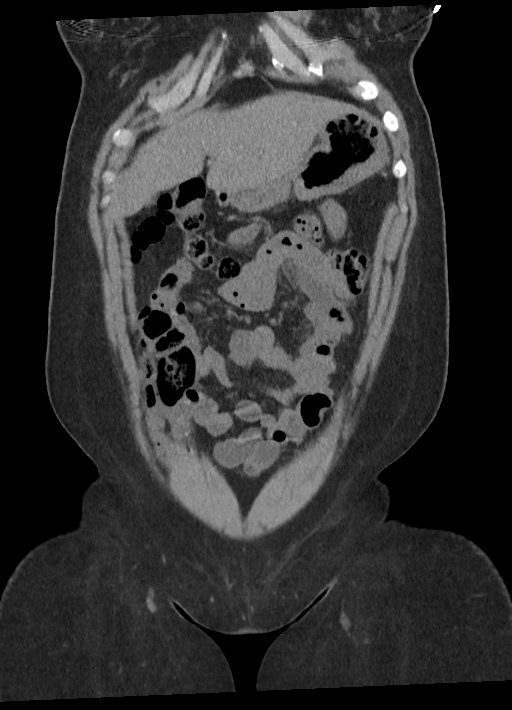
[im 65/147  soft-tissue]
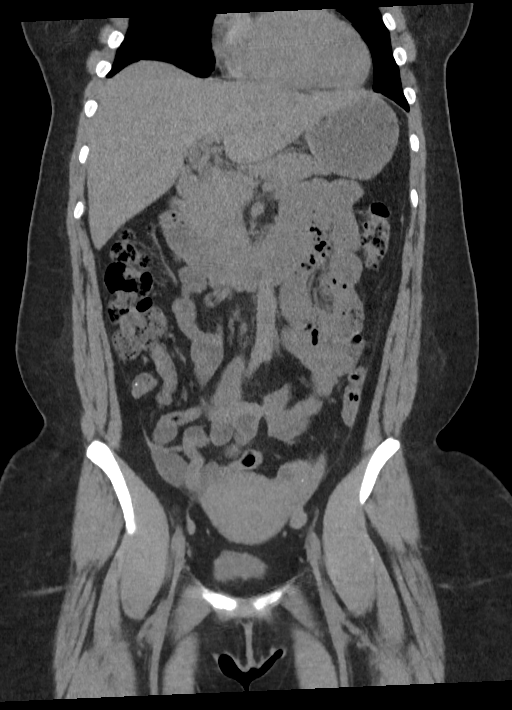
[im 82/147  soft-tissue]
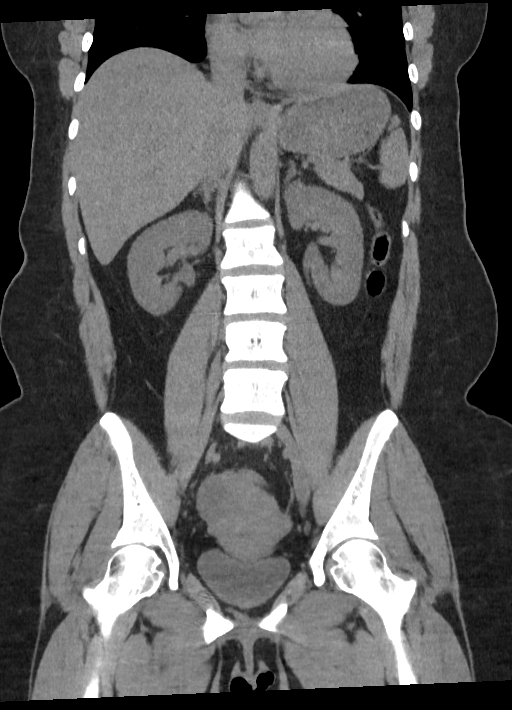

[15 of 46 positions shown; findings below may reference images not displayed]

FINDINGS: Lower chest: No acute abnormality.

Hepatobiliary: No focal liver abnormality is seen. No gallstones,
gallbladder wall thickening, or biliary dilatation.

Pancreas: Unremarkable. No pancreatic ductal dilatation or
surrounding inflammatory changes.

Spleen: Normal in size without focal abnormality.

Adrenals/Urinary Tract: Adrenal glands are unremarkable. Kidneys are
normal, without renal calculi, focal lesion, or hydronephrosis.
Bladder is unremarkable.

Stomach/Bowel: Stomach is within normal limits. Appendix appears
normal. No evidence of bowel wall thickening, distention, or
inflammatory changes.

Vascular/Lymphatic: No significant vascular findings are present. No
enlarged abdominal or pelvic lymph nodes.

Reproductive: Uterus is unremarkable. 5.4 cm right ovarian cyst is
noted. Left ovary is unremarkable.

Other: No abdominal wall hernia or abnormality. No abdominopelvic
ascites.

Musculoskeletal: No acute or significant osseous findings.
IMPRESSION: 5.4 cm right ovarian cyst. Follow-up by US is recommended in 3-6
months. Note: This recommendation does not apply to premenarchal
patients and to those with increased risk (genetic, family history,
elevated tumor markers or other high-risk factors) of ovarian
cancer. Reference: JACR [DATE]):[id number].

No other abnormality seen in the abdomen or pelvis.

## 2021-09-04 MED ORDER — CEPHALEXIN 500 MG PO CAPS: 500.0000 mg | ORAL_CAPSULE | Freq: Two times a day (BID) | ORAL | 0 refills | Status: AC

## 2021-09-04 MED ORDER — ONDANSETRON 4 MG PO TBDP: 4.0000 mg | ORAL_TABLET | Freq: Three times a day (TID) | ORAL | 0 refills | Status: DC | PRN

## 2021-09-04 NOTE — ED Provider Notes (Signed)
? ?[facility] ?Provider Note ? ? ? Event Date/Time  ? First MD Initiated Contact with Patient 09/04/21 1128   ?  (approximate) ? ? ?History  ? ?Abdominal Pain and Emesis ? ? ?HPI ? ?Susan Guerrero is a 46 y.o. female  with history of ovarian cyst and as listed in EMR presents to the emergency department for treatment and evaluation of RLQ abdominal pain with nausea, vomiting, and diarrhea that started yesterday. She has also had left side back pain intermittently for the past week.  ?  ? ?Physical Exam  ? ?Triage Vital Signs: ?ED Triage Vitals  ?Enc Vitals Group  ?   BP 09/04/21 1032 112/79  ?   Pulse Rate 09/04/21 1032 (!) 102  ?   Resp 09/04/21 1032 17  ?   Temp 09/04/21 1032 98.3 ?F (36.8 ?C)  ?   Temp src --   ?   SpO2 09/04/21 1032 100 %  ?   Weight 09/04/21 1013 187 lb (84.8 kg)  ?   Height 09/04/21 1013 '5\' 2"'$  (1.575 m)  ?   Head Circumference --   ?   Peak Flow --   ?   Pain Score 09/04/21 1012 10  ?   Pain Loc --   ?   Pain Edu? --   ?   Excl. in [place]? --   ? ? ?Most recent vital signs: ?Vitals:  ? 09/04/21 1032 09/04/21 1156  ?BP: 112/79 118/78  ?Pulse: (!) 102 99  ?Resp: 17 16  ?Temp: 98.3 ?F (36.8 ?C)   ?SpO2: 100% 99%  ? ? ?General: Awake, no distress.  ?CV:  Good peripheral perfusion.  ?Resp:  Normal effort.  ?Abd:  No distention. Diffuse tenderness over the right upper and lower abdomen. No rebound tenderness.  ?Other:   ? ? ?ED Results / Procedures / Treatments  ? ?Labs ?(all labs ordered are listed, but only abnormal results are displayed) ?Labs Reviewed  ?COMPREHENSIVE METABOLIC PANEL - Abnormal; Notable for the following components:  ?    Result Value  ? Glucose, Bld 136 (*)   ? Calcium 8.7 (*)   ? All other components within normal limits  ?URINALYSIS, ROUTINE W REFLEX MICROSCOPIC - Abnormal; Notable for the following components:  ? Color, Urine YELLOW (*)   ? APPearance CLOUDY (*)   ? Hgb urine dipstick MODERATE (*)   ? Protein, ur 30 (*)   ? Leukocytes,Ua MODERATE (*)    ? RBC / HPF >50 (*)   ? Bacteria, UA FEW (*)   ? All other components within normal limits  ?LIPASE, BLOOD  ?CBC  ?POC URINE PREG, ED  ? ? ? ?EKG ? ?Not indicated. ? ? ?RADIOLOGY ? ?Image and radiology report reviewed by me. ? ?CT for renal stone study shows a 5.4 cm right ovarian cyst. No other acute concerns. ? ?PROCEDURES: ? ?Critical Care performed: No ? ?Procedures ? ? ?MEDICATIONS ORDERED IN ED: ?Medications - No data to display ? ? ?IMPRESSION / MDM / ASSESSMENT AND PLAN / ED COURSE  ? ?I have reviewed the triage note. ? ?Differential diagnosis includes, but is not limited to: kidney stone; pyelonephritis; gastritis, covid, influenza. ? ?46 year old female presenting to the emergency department for treatment and evaluation of abdominal and flank pain with nausea, vomiting, and diarrhea. See HPI. ? ?CBC shows a normal white count of 10.4, CMP overall reassuring.  Glucose is elevated at 136, however this is a nonfasting value.  Lipase is  normal urinalysis does show a moderate amount of hemoglobin with 30 of protein and moderate leukocytes, 11-20 white blood cells and few bacteria.  Plan will be to get a CT to rule out renal stone since she does have evidence of hematuria in addition to the left flank pain. ? ?Results reviewed with the patient.  She also tells me that she has had an ovarian cyst on the right side for quite some time.  This is shown on the CT.  CT is otherwise negative for acute concerns, specifically the appendix appears normal. ? ?Plan will be to treat her with Zofran for the nausea and vomiting and Keflex for presumed acute cystitis.  ER return precautions were discussed.  She was also encouraged to follow-up with her primary care provider or gynecologist for further treatment of ovarian cyst. ?  ? ? ?FINAL CLINICAL IMPRESSION(S) / ED DIAGNOSES  ? ?Final diagnoses:  ?Nausea vomiting and diarrhea  ?Acute cystitis without hematuria  ?Cyst of right ovary  ? ? ? ?Rx / DC Orders  ? ?ED Discharge  Orders   ? ?      Ordered  ?  ondansetron (ZOFRAN-ODT) 4 MG disintegrating tablet  Every 8 hours PRN       ? 09/04/21 1443  ?  cephALEXin (KEFLEX) 500 MG capsule  2 times daily       ? 09/04/21 1443  ? ?  ?  ? ?  ? ? ? ?Note:  This document was prepared using Dragon voice recognition software and may include unintentional dictation errors. ?  Victorino Dike, FNP ?09/04/21 1618 ? ?  ?Harvest Dark, MD ?09/05/21 1103 ? ?

## 2021-09-04 NOTE — Discharge Instructions (Addendum)
Please take the antibiotic as prescribed and until finished. ? ?Follow up with gynecology or the primary care provider for further evaluation of the ovarian cyst. ?

## 2021-09-04 NOTE — ED Triage Notes (Signed)
Pt via POV from home. Pt c/o LLQ abd pain, NVD. Denies any fevers. Denies urinary symptoms. States she is just getting over [place]. Pt still has her gallbladder and appendix. Pt is A&Ox4 and NAD.  ?

## 2021-10-17 ENCOUNTER — Emergency Department: Payer: Medicaid Other

## 2021-10-17 ENCOUNTER — Emergency Department
Admission: EM | Admit: 2021-10-17 | Discharge: 2021-10-17 | Disposition: A | Discharge status: Home / Self Care | Payer: Medicaid Other | Attending: Emergency Medicine | Admitting: Emergency Medicine

## 2021-10-17 ENCOUNTER — Other Ambulatory Visit: Payer: Self-pay

## 2021-10-17 ENCOUNTER — Encounter: Payer: Self-pay | Admitting: Intensive Care

## 2021-10-17 DIAGNOSIS — I872 Venous insufficiency (chronic) (peripheral): Secondary | ICD-10-CM | POA: Diagnosis not present

## 2021-10-17 DIAGNOSIS — R200 Anesthesia of skin: Secondary | ICD-10-CM | POA: Diagnosis present

## 2021-10-17 DIAGNOSIS — J45909 Unspecified asthma, uncomplicated: Secondary | ICD-10-CM | POA: Insufficient documentation

## 2021-10-17 DIAGNOSIS — R202 Paresthesia of skin: Secondary | ICD-10-CM | POA: Diagnosis not present

## 2021-10-17 DIAGNOSIS — E119 Type 2 diabetes mellitus without complications: Secondary | ICD-10-CM | POA: Insufficient documentation

## 2021-10-17 LAB — PROTIME-INR
INR: 1 (ref 0.8–1.2)
Prothrombin Time: 12.8 seconds (ref 11.4–15.2)

## 2021-10-17 LAB — CBC WITH DIFFERENTIAL/PLATELET
Abs Immature Granulocytes: 0.01 10*3/uL (ref 0.00–0.07)
Basophils Absolute: 0 10*3/uL (ref 0.0–0.1)
Basophils Relative: 1 %
Eosinophils Absolute: 0.1 10*3/uL (ref 0.0–0.5)
Eosinophils Relative: 1 %
HCT: 41.6 % (ref 36.0–46.0)
Hemoglobin: 13.3 g/dL (ref 12.0–15.0)
Immature Granulocytes: 0 %
Lymphocytes Relative: 43 %
Lymphs Abs: 2.7 10*3/uL (ref 0.7–4.0)
MCH: 27.8 pg (ref 26.0–34.0)
MCHC: 32 g/dL (ref 30.0–36.0)
MCV: 87 fL (ref 80.0–100.0)
Monocytes Absolute: 0.6 10*3/uL (ref 0.1–1.0)
Monocytes Relative: 9 %
Neutro Abs: 2.9 10*3/uL (ref 1.7–7.7)
Neutrophils Relative %: 46 %
Platelets: 283 10*3/uL (ref 150–400)
RBC: 4.78 MIL/uL (ref 3.87–5.11)
RDW: 12.9 % (ref 11.5–15.5)
WBC: 6.3 10*3/uL (ref 4.0–10.5)
nRBC: 0 % (ref 0.0–0.2)

## 2021-10-17 LAB — POC URINE PREG, ED: Preg Test, Ur: NEGATIVE

## 2021-10-17 LAB — BASIC METABOLIC PANEL
Anion gap: 8 (ref 5–15)
BUN: 12 mg/dL (ref 6–20)
CO2: 25 mmol/L (ref 22–32)
Calcium: 8.8 mg/dL — ABNORMAL LOW (ref 8.9–10.3)
Chloride: 105 mmol/L (ref 98–111)
Creatinine, Ser: 0.85 mg/dL (ref 0.44–1.00)
GFR, Estimated: >60 mL/min (ref >60)
Glucose, Bld: 109 mg/dL — ABNORMAL HIGH (ref 70–99)
Potassium: 3.8 mmol/L (ref 3.5–5.1)
Sodium: 138 mmol/L (ref 135–145)

## 2021-10-17 LAB — CBG MONITORING, ED: Glucose-Capillary: 128 mg/dL — ABNORMAL HIGH (ref 70–99)

## 2021-10-17 IMAGING — US US EXTREM LOW VENOUS
1 series · 14 of 24 positions shown · non-contrast
Comparison: None Available.

CLINICAL DATA: Bilateral leg pain and swelling for 5 days. Clinical
concern for DVT.

EXAM:
BILATERAL LOWER EXTREMITY VENOUS DOPPLER ULTRASOUND
TECHNIQUE: Gray-scale sonography with compression, as well as color and duplex
ultrasound, were performed to evaluate the deep venous system(s)
from the level of the common femoral vein through the popliteal and
proximal calf veins.

[Series 1: us venous img lower bilat (dvt) · portal-venous · 14 of 63 slices shown]
[im 1/63]
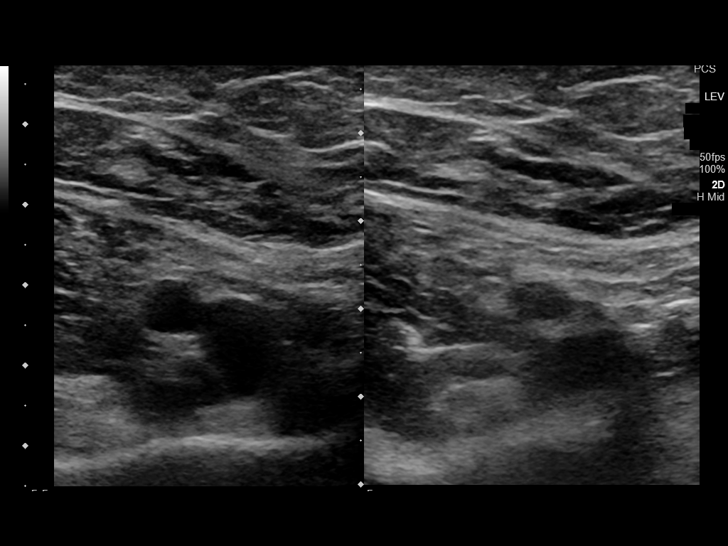
[im 6/63]
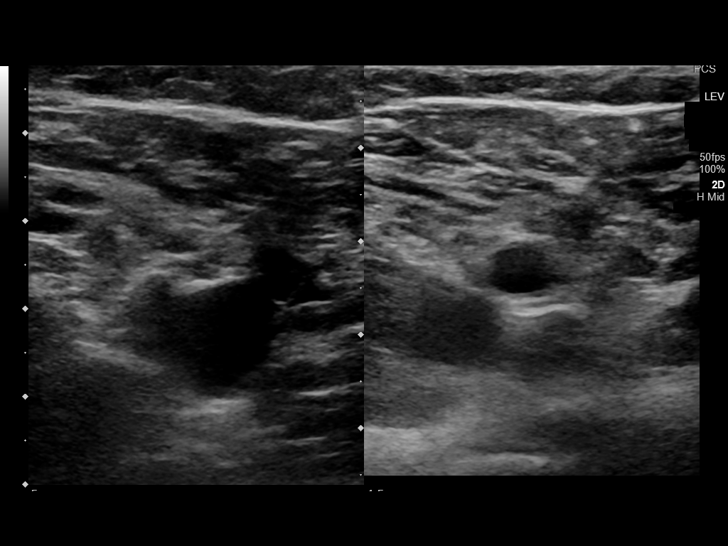
[im 11/63]
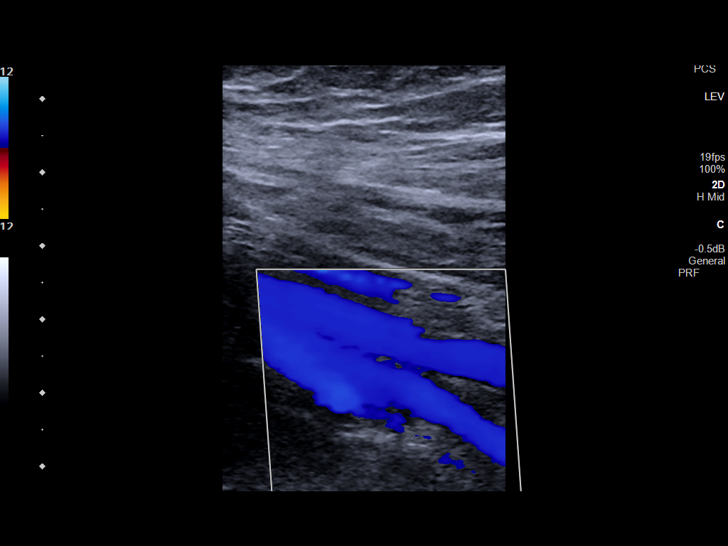
[im 17/63]
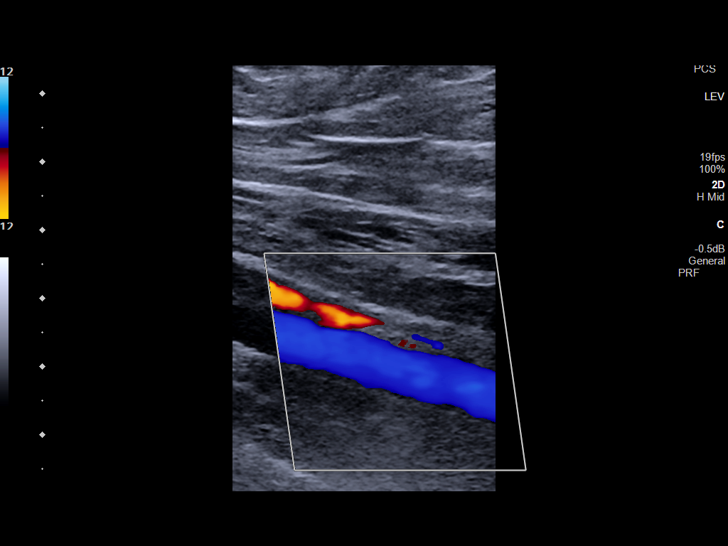
[im 19/63]
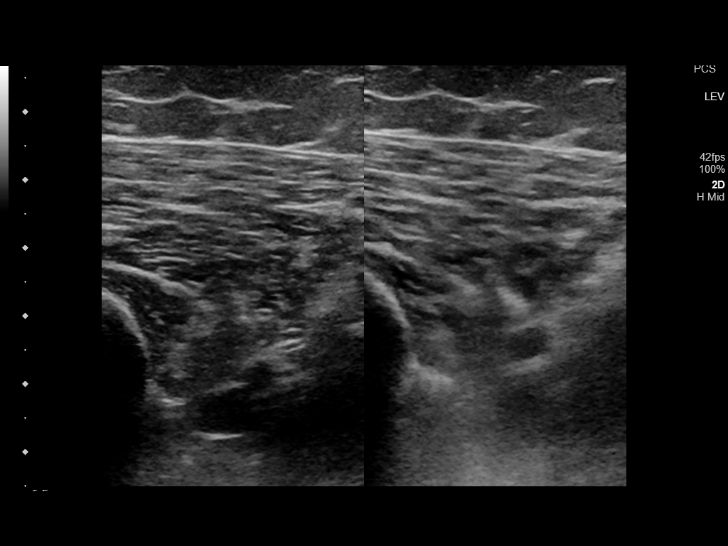
[im 25/63]
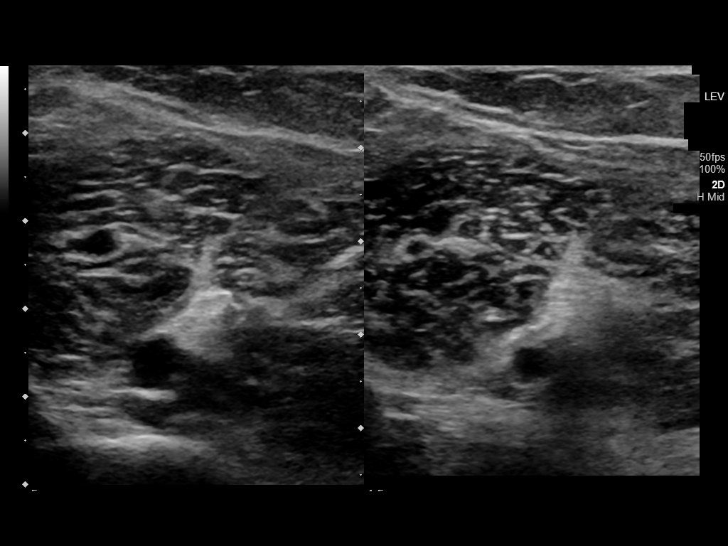
[im 30/63]
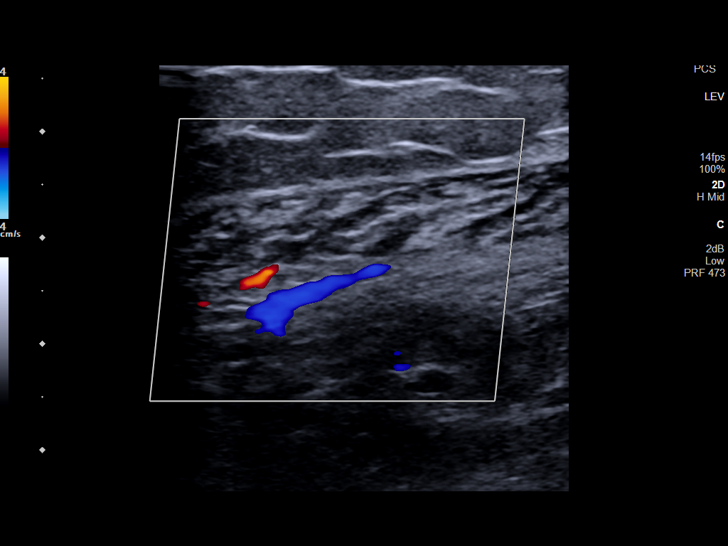
[im 33/63]
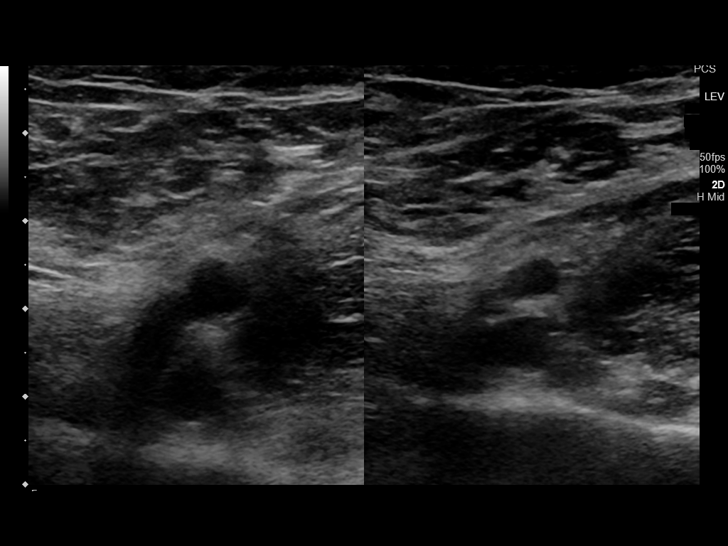
[im 38/63]
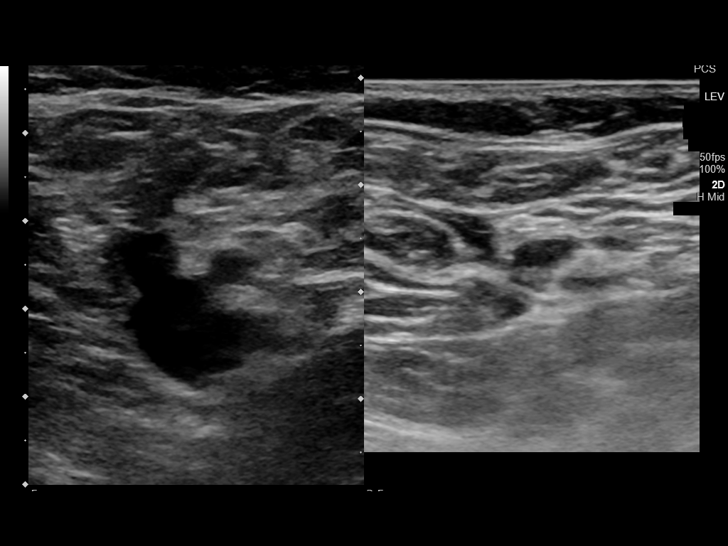
[im 44/63]
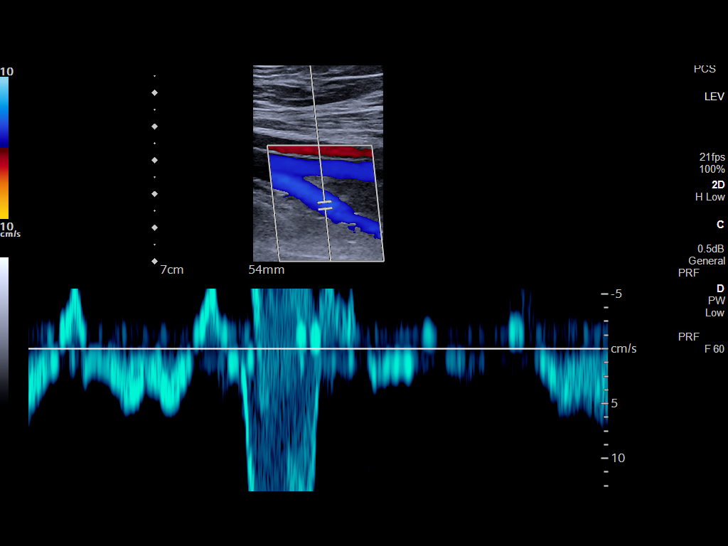
[im 49/63]
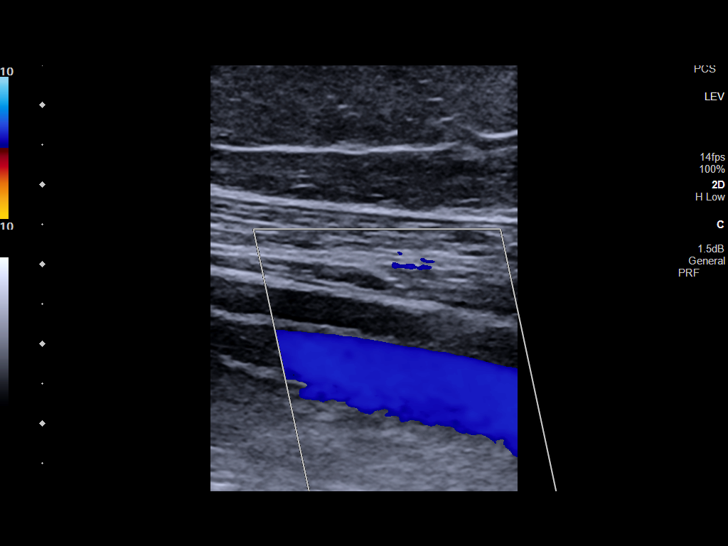
[im 52/63]
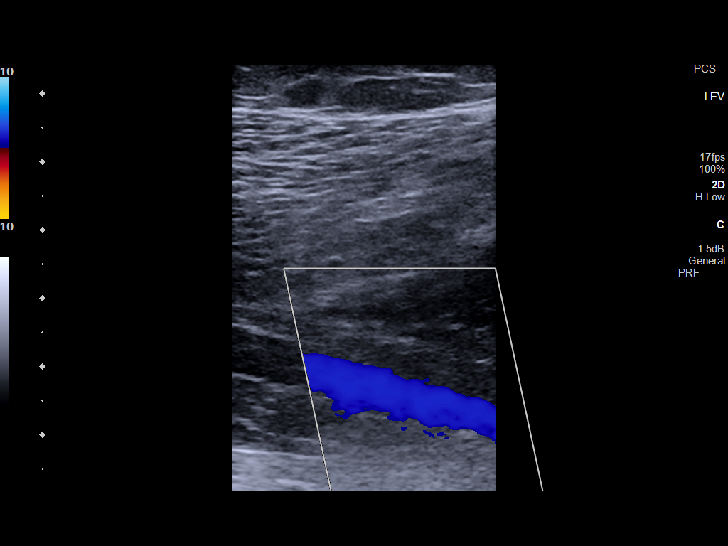
[im 57/63]
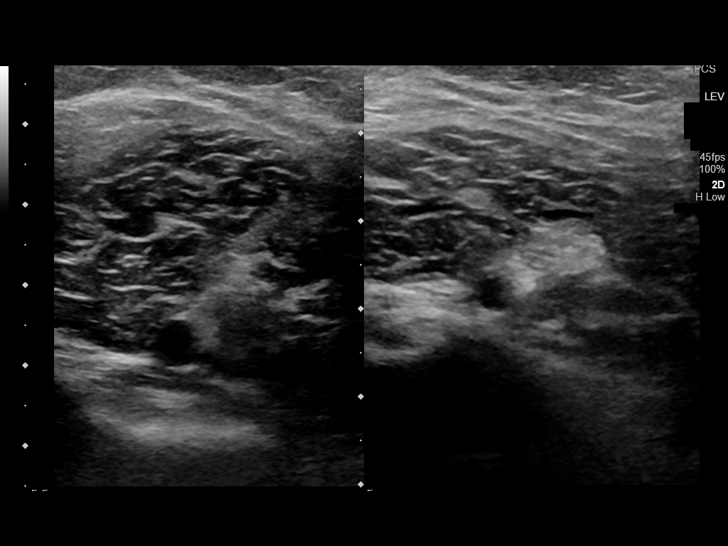
[im 63/63]
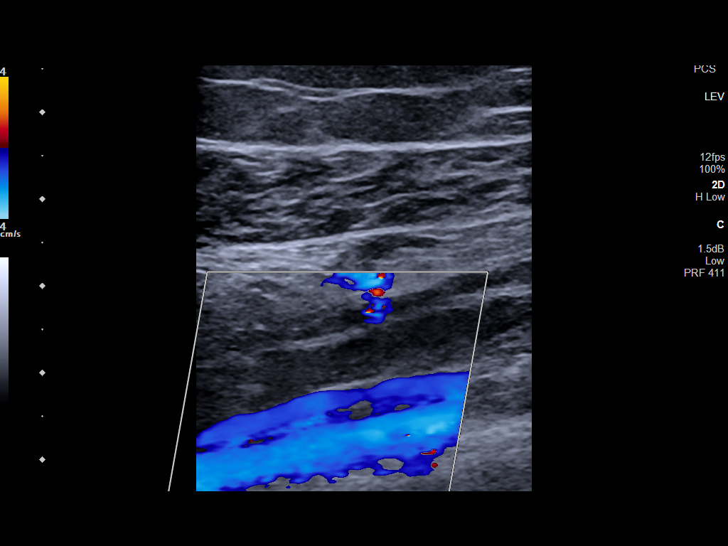

[14 of 24 positions shown; findings below may reference images not displayed]

FINDINGS: VENOUS

Normal compressibility of the common femoral, superficial femoral,
and popliteal veins, as well as the visualized calf veins.
Visualized portions of profunda femoral vein and great saphenous
vein unremarkable. No filling defects to suggest DVT on grayscale or
color Doppler imaging. Doppler waveforms show normal direction of
venous flow, normal respiratory plasticity and response to
augmentation.

OTHER

None.

Limitations: none
IMPRESSION: No evidence of DVT within either lower extremity.

## 2021-10-17 IMAGING — MR MR LUMBAR SPINE W/O CM
5 series · 32 of 48 positions shown · non-contrast
Comparison: Radiography [DATE]

CLINICAL DATA: Low back pain. Cauda equina syndrome suspected.
Numbness of the right leg. Weakness of the right leg.

EXAM:
MRI LUMBAR SPINE WITHOUT CONTRAST
TECHNIQUE: Multiplanar, multisequence MR imaging of the lumbar spine was
performed. No intravenous contrast was administered.

[Series 5: T2 · sagittal · 4.0mm · 0.81mm/px · 7 of 15 slices shown (1 of 2)]
[im 1/15]
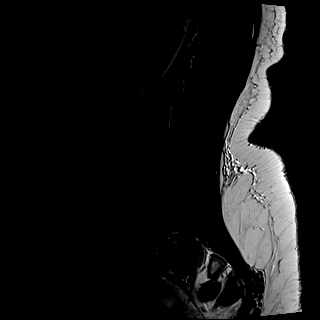
[im 3/15]
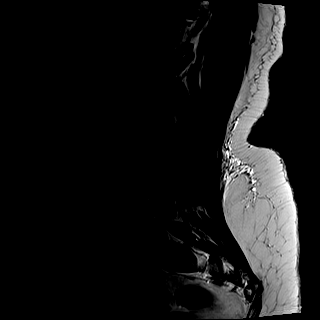
[im 5/15]
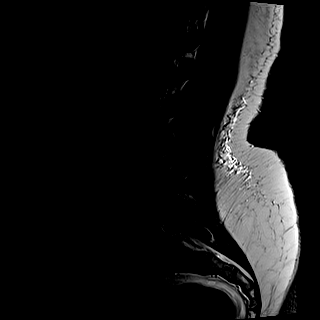
[im 8/15]
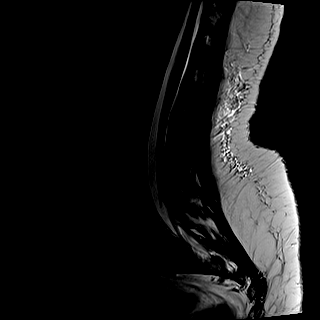
[im 10/15]
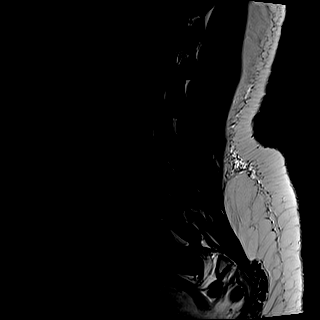
[im 12/15]
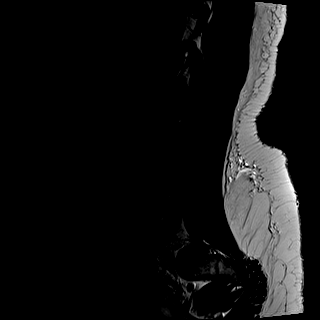
[im 15/15]
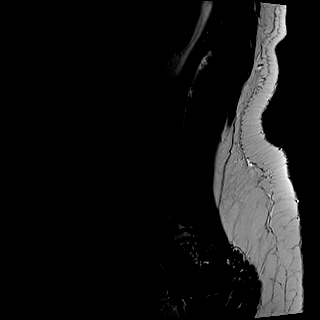

[Series 6: T1 · sagittal · 4.0mm · 0.81mm/px · 7 of 15 slices shown (1 of 2)]
[im 1/15]
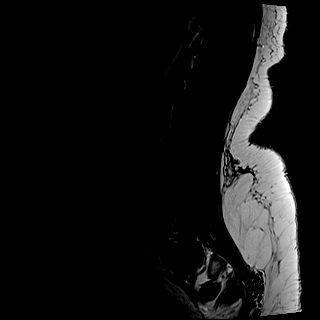
[im 3/15]
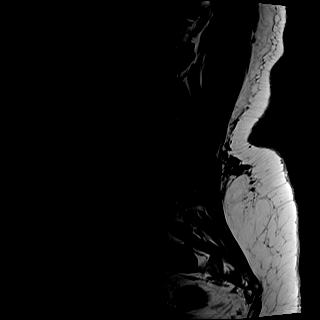
[im 5/15]
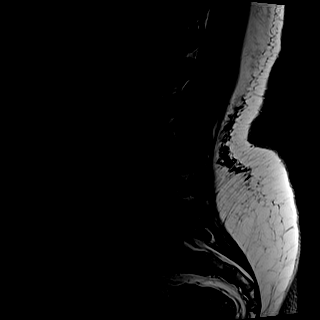
[im 8/15]
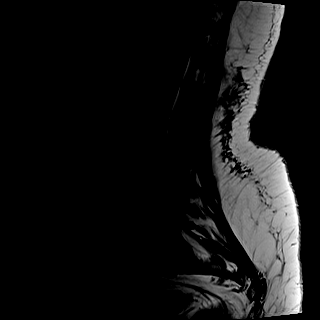
[im 10/15]
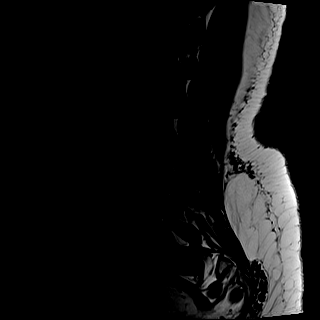
[im 12/15]
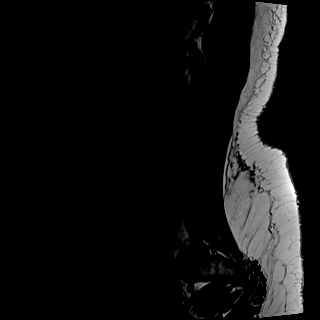
[im 15/15]
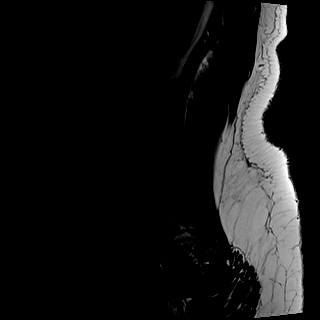

[Series 7: STIR · sagittal · 4.0mm · 0.41mm/px · 2 of 15 slices shown]
[im 1/15]
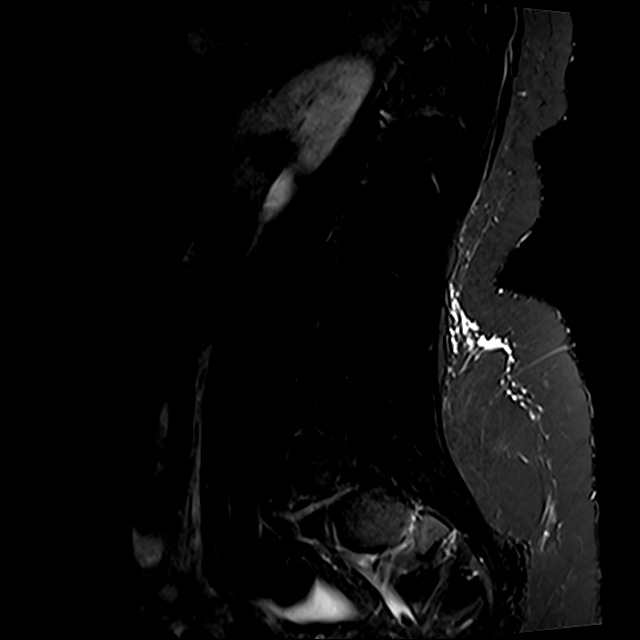
[im 3/15]
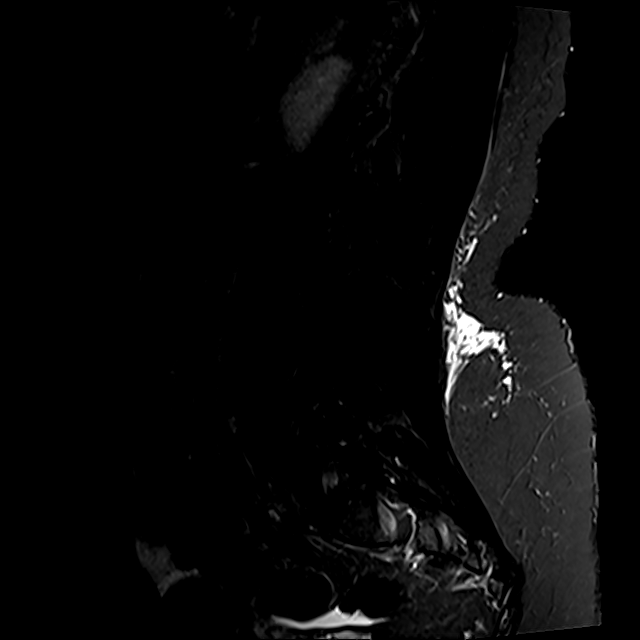

[Series 8: T2 · axial · 4.0mm · 0.78mm/px · z∈[-174,+40]mm · 8 of 34 slices shown (2 of 2)]
[im 1/34]
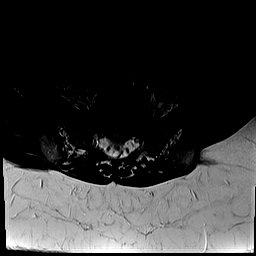
[im 6/34]
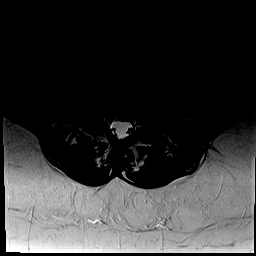
[im 11/34]
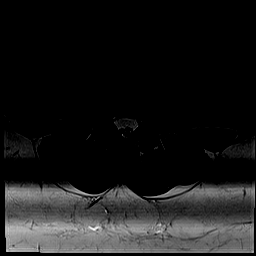
[im 16/34]
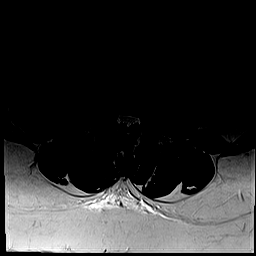
[im 18/34]
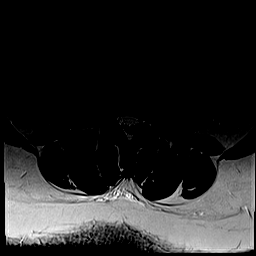
[im 23/34]
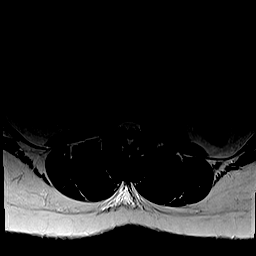
[im 28/34]
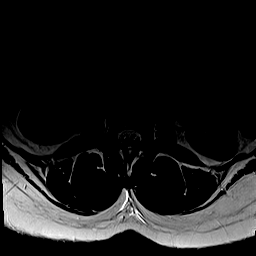
[im 34/34]
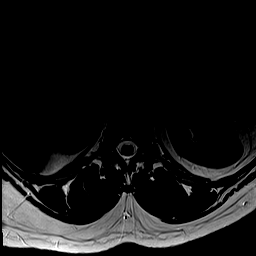

[Series 9: T1 · axial · 4.0mm · 0.39mm/px · z∈[-174,+40]mm · 8 of 34 slices shown (2 of 2)]
[im 1/34]
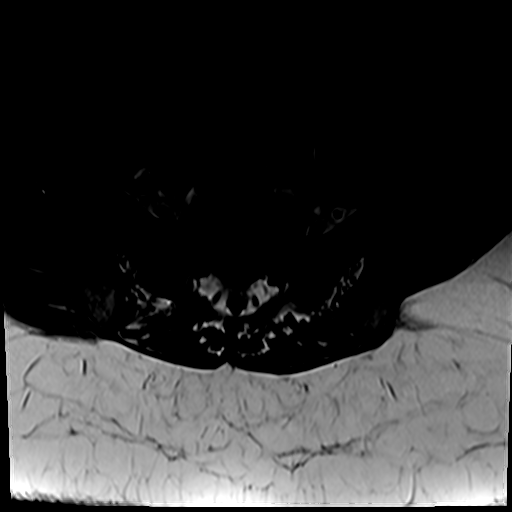
[im 6/34]
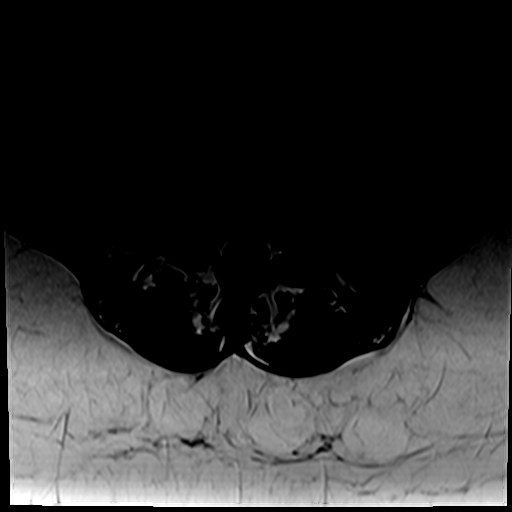
[im 11/34]
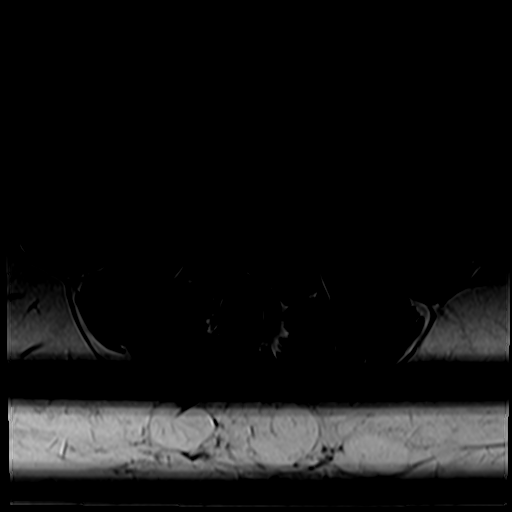
[im 16/34]
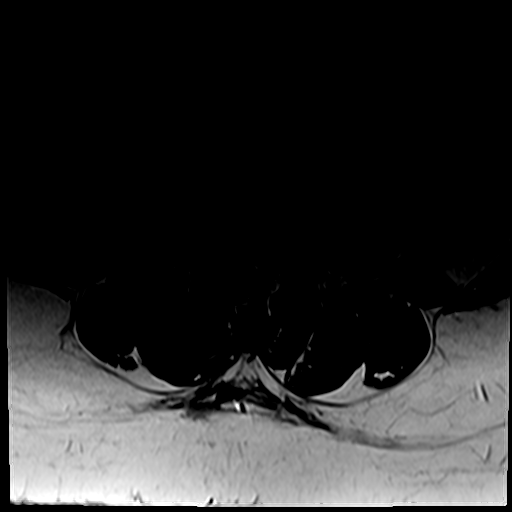
[im 18/34]
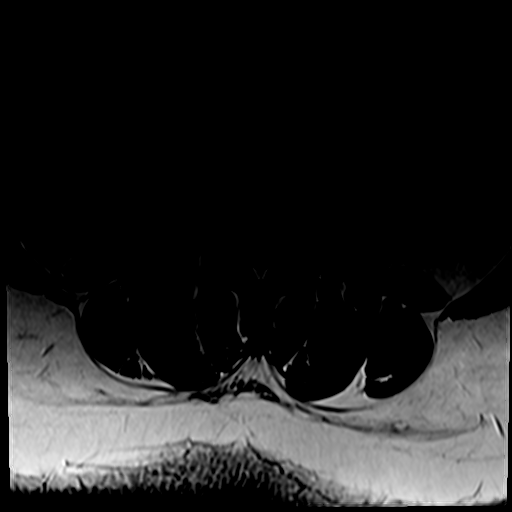
[im 23/34]
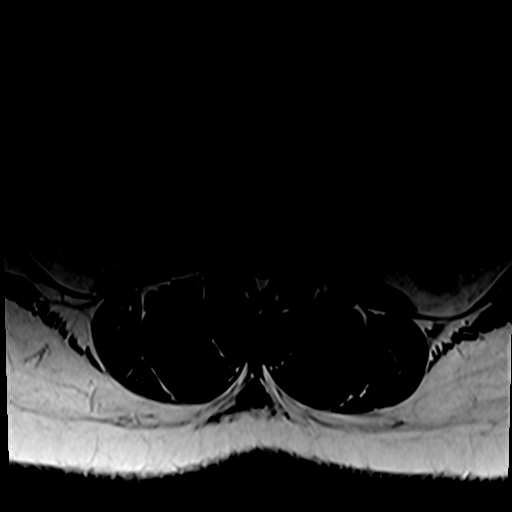
[im 28/34]
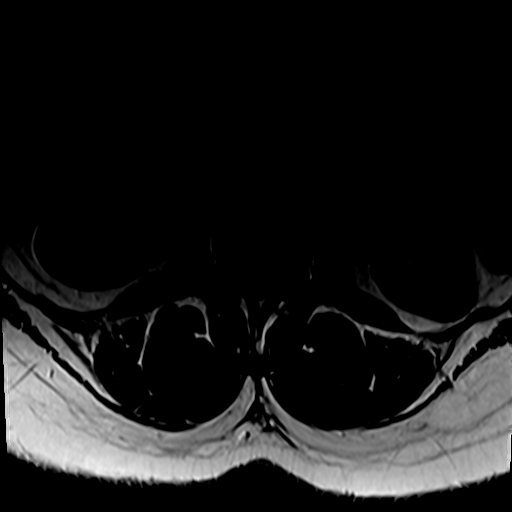
[im 34/34]
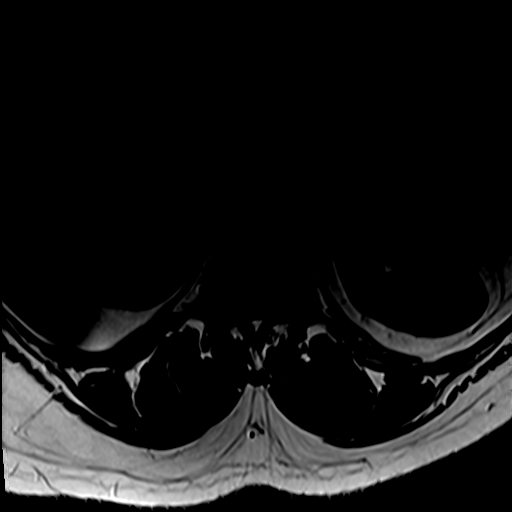

[32 of 48 positions shown; findings below may reference images not displayed]

FINDINGS: Segmentation: Transitional lumbosacral anatomy. L5 is described as a
sacralized vertebra based on the position of the conus. Correlation
with this numbering terminology would be important should
intervention be contemplated.

Alignment: Minimal scoliotic curvature convex to the left. No antero
or retrolisthesis.

Vertebrae:  No fracture or focal bone lesion.

Conus medullaris and cauda equina: Conus extends to the L1-2 level.
Conus and cauda equina appear normal.

Paraspinal and other soft tissues: Negative

Disc levels:

No disc level abnormality at L2-3 or above.

L3-4: Very minimal disc bulge. Mild facet and ligamentous
hypertrophy. No stenosis.

L4-5: Normal appearance of the disc. Minimal facet and ligamentous
hypertrophy. No stenosis.

L5-S1: Transitional level. No degenerative change. Canal and
foramina are widely patent.
IMPRESSION: L5 is described as a sacralized vertebra.

Mild scoliotic curvature convex to the left.

Very minimal disc bulges at L3-4 and L4-5. Minimal facet and
ligamentous hypertrophy at those levels without joint effusions or
edema. No compressive stenosis. I do not see a finding that would
explain the patient's right radicular symptoms.

## 2021-10-17 MED ORDER — KETOROLAC TROMETHAMINE 30 MG/ML IJ SOLN
15.0000 mg | Freq: Once | INTRAMUSCULAR | Status: AC
  Administered 2021-10-17: 15 mg via INTRAVENOUS
  Filled 2021-10-17: qty 1

## 2021-10-17 MED ORDER — DICLOFENAC SODIUM 1 % EX GEL
4.0000 g | Freq: Four times a day (QID) | CUTANEOUS | 1 refills | Status: AC
Start: 2021-10-17 — End: ?

## 2021-10-17 NOTE — ED Triage Notes (Signed)
Patient c/o numbness in bilateral thighs down to feet for a few days. Reports pain ?

## 2021-10-17 NOTE — ED Notes (Signed)
Pt given urine cup and assisted to toilet ? ?

## 2021-10-17 NOTE — ED Provider Notes (Signed)
? ?[facility] ?Provider Note ? ? ? Event Date/Time  ? First MD Initiated Contact with Patient 10/17/21 1041   ?  (approximate) ? ? ?History  ? ?Chief Complaint ?Numbness ? ? ?HPI ? ?Susan Guerrero is a 46 y.o. female with past medical history of asthma, diabetes, and venous insufficiency who presents to the ED complaining of leg pain and numbness.  Patient reports that she has been dealing with increasing, numbness, tingling, and pain to both thighs for about the past week.  Symptoms have been gradually worsening and extending down into both calves and feet.  She describes a sensation of numbness and tingling along with both legs feeling weaker than usual.  She denies any recent trauma, does report dealing with chronic pain in both knees but states current symptoms are different.  She feels like both her thighs and calves are becoming swollen and tender to touch, but she denies any rash or wounds.  She has noticed increasing venous varicosities in both thighs which was previously limited to area behind both knees.  She denies any pain in her back or saddle anesthesia but does report chronic urinary incontinence at times for the past 2 years.  She was recently referred to vascular surgery by her PCP for varicose veins, but denies any history of DVT/PE. ?  ? ? ?Physical Exam  ? ?Triage Vital Signs: ?ED Triage Vitals  ?Enc Vitals Group  ?   BP 10/17/21 1013 (!) 130/95  ?   Pulse Rate 10/17/21 1013 63  ?   Resp 10/17/21 1013 16  ?   Temp 10/17/21 1013 98.3 ?F (36.8 ?C)  ?   Temp Source 10/17/21 1013 Oral  ?   SpO2 10/17/21 1013 94 %  ?   Weight 10/17/21 1014 187 lb (84.8 kg)  ?   Height 10/17/21 1014 '5\' 2"'$  (1.575 m)  ?   Head Circumference --   ?   Peak Flow --   ?   Pain Score 10/17/21 1013 9  ?   Pain Loc --   ?   Pain Edu? --   ?   Excl. in [place]? --   ? ? ?Most recent vital signs: ?Vitals:  ? 10/17/21 1300 10/17/21 1456  ?BP: 130/88 132/84  ?Pulse: 60 68  ?Resp: 16 16  ?Temp:    ?SpO2: 95%  96%  ? ? ?Constitutional: Alert and oriented. ?Eyes: Conjunctivae are normal. ?Head: Atraumatic. ?Nose: No congestion/rhinnorhea. ?Mouth/Throat: Mucous membranes are moist.  ?Cardiovascular: Normal rate, regular rhythm. Grossly normal heart sounds.  2+ radial and DP pulses bilaterally. ?Respiratory: Normal respiratory effort.  No retractions. Lungs CTAB. ?Gastrointestinal: Soft and nontender. No distention. ?Musculoskeletal: Diffuse tenderness to palpation of bilateral thighs and calves with trace pitting edema.  Varicose veins noted without any rash, erythema, warmth, or drainage.  No midline thoracic or lumbar spinal tenderness to palpation. ?Neurologic:  Normal speech and language. No gross focal neurologic deficits are appreciated, strength 5 out of 5 in bilateral lower extremities. ? ? ? ?ED Results / Procedures / Treatments  ? ?Labs ?(all labs ordered are listed, but only abnormal results are displayed) ?Labs Reviewed  ?BASIC METABOLIC PANEL - Abnormal; Notable for the following components:  ?    Result Value  ? Glucose, Bld 109 (*)   ? Calcium 8.8 (*)   ? All other components within normal limits  ?CBG MONITORING, ED - Abnormal; Notable for the following components:  ? Glucose-Capillary 128 (*)   ?  All other components within normal limits  ?CBC WITH DIFFERENTIAL/PLATELET  ?PROTIME-INR  ?CBG MONITORING, ED  ?POC URINE PREG, ED  ? ?RADIOLOGY ?MRI of lumbar spine reviewed by me with no significant central cord compression. ? ?PROCEDURES: ? ?Critical Care performed: No ? ?Procedures ? ? ?MEDICATIONS ORDERED IN ED: ?Medications  ?ketorolac (TORADOL) 30 MG/ML injection 15 mg (15 mg Intravenous Given 10/17/21 1118)  ? ? ? ?IMPRESSION / MDM / ASSESSMENT AND PLAN / ED COURSE  ?I reviewed the triage vital signs and the nursing notes. ?             ?               ? ?46 y.o. female with past medical history of asthma, diabetes, and venous insufficiency who presents to the ED complaining of increasing lower extremity  numbness, pain, and swelling over the past week with no recent trauma. ? ?Differential diagnosis includes, but is not limited to, arterial insufficiency, venous insufficiency, DVT, cauda equina, lumbar radiculopathy, cellulitis, acute on chronic pain syndrome. ? ?Patient well-appearing and in no acute distress, vital signs are unremarkable and she is vascularly intact to her bilateral lower extremities with 2+ DP pulses.  She does have increasing edema and venous varicosities, we will further assess with ultrasound to rule out DVT.  Strength appears intact as patient is able to ambulate on her own but with increasing numbness and urinary incontinence, we will further assess with MRI to rule out cauda equina and also check for findings consistent with lumbar radiculopathy.  No findings to suggest infectious process and if work-up is unremarkable, suspect symptoms are related to chronic venous insufficiency. ? ?Lower extremity ultrasound is unremarkable with no evidence of DVT.  MRI shows no evidence of cauda equina or radicular compression.  Labs are also reassuring with CBC showing no anemia or leukocytosis, BMP without electrolyte abnormality or AKI.  Symptoms seem most likely related to patient's chronic venous insufficiency, no findings to suggest Guillain-Barr? at this time.  She is appropriate for discharge home and has follow-up with vascular surgery previously scheduled.  She was advised to use compression stockings and will also be prescribed topical Voltaren for use as needed for pain.  She was counseled to return to the ED for new worsening symptoms, patient agrees with plan. ? ?  ? ? ?FINAL CLINICAL IMPRESSION(S) / ED DIAGNOSES  ? ?Final diagnoses:  ?Bilateral leg numbness  ?Paresthesia  ?Venous insufficiency  ? ? ? ?Rx / DC Orders  ? ?ED Discharge Orders   ? ?      Ordered  ?  diclofenac Sodium (VOLTAREN) 1 % GEL  4 times daily       ? 10/17/21 1524  ? ?  ?  ? ?  ? ? ? ?Note:  This document was prepared  using Dragon voice recognition software and may include unintentional dictation errors. ?  ?Blake Divine, MD ?10/17/21 1526 ? ?

## 2021-10-17 NOTE — ED Notes (Signed)
See triage note  presents with numbness to right leg  states weakness goes from her hip into her leg  started 3-4 days ago ?

## 2021-10-26 ENCOUNTER — Other Ambulatory Visit: Payer: Self-pay

## 2021-10-26 ENCOUNTER — Emergency Department
Admission: EM | Admit: 2021-10-26 | Discharge: 2021-10-26 | Disposition: A | Discharge status: Home / Self Care | Payer: Medicaid Other | Attending: Emergency Medicine | Admitting: Emergency Medicine

## 2021-10-26 DIAGNOSIS — K047 Periapical abscess without sinus: Secondary | ICD-10-CM | POA: Insufficient documentation

## 2021-10-26 DIAGNOSIS — K0889 Other specified disorders of teeth and supporting structures: Secondary | ICD-10-CM | POA: Diagnosis present

## 2021-10-26 MED ORDER — LIDOCAINE-EPINEPHRINE 2 %-1:100000 IJ SOLN
1.7000 mL | Freq: Once | INTRAMUSCULAR | Status: AC
  Administered 2021-10-26: 1.7 mL
  Filled 2021-10-26: qty 1.7

## 2021-10-26 MED ORDER — AMOXICILLIN 875 MG PO TABS: 875.0000 mg | ORAL_TABLET | Freq: Two times a day (BID) | ORAL | 0 refills | Status: AC

## 2021-10-26 MED ORDER — AMOXICILLIN 500 MG PO CAPS
1000.0000 mg | ORAL_CAPSULE | Freq: Once | ORAL | Status: AC
  Administered 2021-10-26: 1000 mg via ORAL
  Filled 2021-10-26: qty 2

## 2021-10-26 MED ORDER — HYDROCODONE-ACETAMINOPHEN 5-325 MG PO TABS
1.0000 | ORAL_TABLET | Freq: Once | ORAL | Status: AC
  Administered 2021-10-26: 1 via ORAL
  Filled 2021-10-26: qty 1

## 2021-10-26 MED ORDER — HYDROCODONE-ACETAMINOPHEN 5-325 MG PO TABS: 1.0000 | ORAL_TABLET | ORAL | 0 refills | Status: AC | PRN

## 2021-10-26 NOTE — ED Provider Notes (Signed)
[facility] Provider Note  Patient Contact: 9:04 PM (approximate)   History   Dental Pain   HPI  Susan Guerrero is a 46 y.o. female who presents the emergency department complaining of left upper dental pain.  Patient states that she has had increasing pain over last 2 to 3 days.  She states that she has had a dentist in the past, however they had canceled several of her appointments on the same day and she decided not to return to them.  No difficulty breathing or swallowing.  No fevers or chills.  No dental injuries reported.     Physical Exam   Triage Vital Signs: ED Triage Vitals  Enc Vitals Group     BP 10/26/21 1954 137/83     Pulse Rate 10/26/21 1954 77     Resp 10/26/21 1954 17     Temp 10/26/21 1954 97.7 F (36.5 C)     Temp Source 10/26/21 1954 Oral     SpO2 10/26/21 1954 98 %     Weight 10/26/21 1955 186 lb 15.2 oz (84.8 kg)     Height 10/26/21 1955 '5\' 2"'$  (1.575 m)     Head Circumference --      Peak Flow --      Pain Score 10/26/21 1954 10     Pain Loc --      Pain Edu? --      Excl. in [place]? --     Most recent vital signs: Vitals:   10/26/21 1954  BP: 137/83  Pulse: 77  Resp: 17  Temp: 97.7 F (36.5 C)  SpO2: 98%     General: Alert and in no acute distress. ENT:      Ears:       Nose: No congestion/rhinnorhea.      Mouth/Throat: Mucous membranes are moist.  Visualization of the left upper dentition reveals some mild erythema about the first molar of the left upper dentition.  No fluctuance concerning for abscess. Hematological/Lymphatic/Immunilogical: No cervical lymphadenopathy. Cardiovascular:  Good peripheral perfusion Respiratory: Normal respiratory effort without tachypnea or retractions. Lungs CTAB emergency department strong Musculoskeletal: Full range of motion to all extremities.  Neurologic:  No gross focal neurologic deficits are appreciated.  Skin:   No rash noted Other:   ED Results / Procedures /  Treatments   Labs (all labs ordered are listed, but only abnormal results are displayed) Labs Reviewed - No data to display   EKG     RADIOLOGY    No results found.  PROCEDURES:  Critical Care performed: No  Dental Block  Date/Time: 10/26/2021 9:34 PM Performed by: Darletta Moll, PA-C Authorized by: Darletta Moll, PA-C   Consent:    Consent obtained:  Verbal   Consent given by:  Patient   Risks discussed:  Pain and unsuccessful block Universal protocol:    Procedure explained and questions answered to patient or proxy's satisfaction: yes     Immediately prior to procedure, a time out was called: yes     Patient identity confirmed:  Verbally with patient Indications:    Indications: dental abscess   Location:    Block type:  Posterior superior alveolar   Laterality:  Left Procedure details:    Needle gauge:  27 G   Anesthetic injected:  Lidocaine 1% WITH epi   Injection procedure:  Anatomic landmarks identified, introduced needle, negative aspiration for blood and incremental injection Post-procedure details:    Outcome:  Anesthesia achieved  Procedure completion:  Tolerated well, no immediate complications   MEDICATIONS ORDERED IN ED: Medications  lidocaine-EPINEPHrine (XYLOCAINE W/EPI) 2 %-1:100000 (with pres) injection 1.7 mL (1.7 mLs Infiltration Given 10/26/21 2118)  HYDROcodone-acetaminophen (NORCO/VICODIN) 5-325 MG per tablet 1 tablet (1 tablet Oral Given 10/26/21 2118)  amoxicillin (AMOXIL) capsule 1,000 mg (1,000 mg Oral Given 10/26/21 2118)     IMPRESSION / MDM / ASSESSMENT AND PLAN / ED COURSE  I reviewed the triage vital signs and the nursing notes.                              Differential diagnosis includes, but is not limited to, dental infection, dental pain, broken dentition   Patient's diagnosis is consistent with dental infection.  Patient presented to the emergency department with left upper dental pain.  Symptoms began  2 to 3 days ago and have been worsening.  No fevers or chills, difficulty breathing or swallowing.  Findings on physical exam are consistent with dental infection.  Antibiotics administered here.  Patient also received a dental block as described above.  Patient will have scription sent for her dental infection to her pharmacy.  Follow-up with dentist.  Return precautions discussed with the patient..  Patient is given ED precautions to return to the ED for any worsening or new symptoms.        FINAL CLINICAL IMPRESSION(S) / ED DIAGNOSES   Final diagnoses:  Dental infection     Rx / DC Orders   ED Discharge Orders          Ordered    HYDROcodone-acetaminophen (NORCO/VICODIN) 5-325 MG tablet  Every 4 hours PRN        10/26/21 2134    amoxicillin (AMOXIL) 875 MG tablet  2 times daily        10/26/21 2134             Note:  This document was prepared using Dragon voice recognition software and may include unintentional dictation errors.   Brynda Peon 10/26/21 2137    Duffy Bruce, MD 11/03/21 [phone]

## 2021-10-26 NOTE — ED Triage Notes (Signed)
Pt has pain in the left upper mouth that started a few days ago. Pt is unable to control the pain with over the counter meds at home. Pt denies any fever, nausea, vomiting, or diarrhea. Pt had a tooth that broke and had an appointment last month that was canceled.  ?

## 2022-03-22 ENCOUNTER — Emergency Department: Payer: Medicaid Other

## 2022-03-22 ENCOUNTER — Other Ambulatory Visit: Payer: Self-pay

## 2022-03-22 ENCOUNTER — Encounter: Payer: Self-pay | Admitting: Emergency Medicine

## 2022-03-22 ENCOUNTER — Emergency Department
Admission: EM | Admit: 2022-03-22 | Discharge: 2022-03-22 | Disposition: A | Discharge status: Home / Self Care | Payer: Medicaid Other | Attending: Emergency Medicine | Admitting: Emergency Medicine

## 2022-03-22 DIAGNOSIS — Z20822 Contact with and (suspected) exposure to covid-19: Secondary | ICD-10-CM | POA: Diagnosis not present

## 2022-03-22 DIAGNOSIS — K2940 Chronic atrophic gastritis without bleeding: Secondary | ICD-10-CM

## 2022-03-22 DIAGNOSIS — R109 Unspecified abdominal pain: Secondary | ICD-10-CM | POA: Diagnosis present

## 2022-03-22 DIAGNOSIS — R112 Nausea with vomiting, unspecified: Secondary | ICD-10-CM

## 2022-03-22 LAB — COMPREHENSIVE METABOLIC PANEL
ALT: 16 U/L (ref 0–44)
AST: 23 U/L (ref 15–41)
Albumin: 3.8 g/dL (ref 3.5–5.0)
Alkaline Phosphatase: 74 U/L (ref 38–126)
Anion gap: 7 (ref 5–15)
BUN: 9 mg/dL (ref 6–20)
CO2: 25 mmol/L (ref 22–32)
Calcium: 9.1 mg/dL (ref 8.9–10.3)
Chloride: 105 mmol/L (ref 98–111)
Creatinine, Ser: 0.87 mg/dL (ref 0.44–1.00)
GFR, Estimated: >60 mL/min (ref >60)
Glucose, Bld: 162 mg/dL — ABNORMAL HIGH (ref 70–99)
Potassium: 3.6 mmol/L (ref 3.5–5.1)
Sodium: 137 mmol/L (ref 135–145)
Total Bilirubin: 0.5 mg/dL (ref 0.3–1.2)
Total Protein: 6.8 g/dL (ref 6.5–8.1)

## 2022-03-22 LAB — CBC
HCT: 40.9 % (ref 36.0–46.0)
Hemoglobin: 13.1 g/dL (ref 12.0–15.0)
MCH: 27.7 pg (ref 26.0–34.0)
MCHC: 32 g/dL (ref 30.0–36.0)
MCV: 86.5 fL (ref 80.0–100.0)
Platelets: 284 10*3/uL (ref 150–400)
RBC: 4.73 MIL/uL (ref 3.87–5.11)
RDW: 13 % (ref 11.5–15.5)
WBC: 5.5 10*3/uL (ref 4.0–10.5)
nRBC: 0 % (ref 0.0–0.2)

## 2022-03-22 LAB — URINALYSIS, ROUTINE W REFLEX MICROSCOPIC
Bacteria, UA: NONE SEEN
Bilirubin Urine: NEGATIVE
Glucose, UA: NEGATIVE mg/dL
Ketones, ur: NEGATIVE mg/dL
Nitrite: NEGATIVE
Protein, ur: NEGATIVE mg/dL
Specific Gravity, Urine: 1.023 (ref 1.005–1.030)
Squamous Epithelial / HPF: >50 — ABNORMAL HIGH (ref 0–5)
pH: 5 (ref 5.0–8.0)

## 2022-03-22 LAB — RESP PANEL BY RT-PCR (FLU A&B, COVID) ARPGX2
Influenza A by PCR: NEGATIVE
Influenza B by PCR: NEGATIVE
SARS Coronavirus 2 by RT PCR: NEGATIVE

## 2022-03-22 LAB — WET PREP, GENITAL
Clue Cells Wet Prep HPF POC: NONE SEEN
Sperm: NONE SEEN
Trich, Wet Prep: NONE SEEN
WBC, Wet Prep HPF POC: <10 (ref <10)
Yeast Wet Prep HPF POC: NONE SEEN

## 2022-03-22 LAB — POC URINE PREG, ED: Preg Test, Ur: NEGATIVE

## 2022-03-22 LAB — CHLAMYDIA/NGC RT PCR (ARMC ONLY)
Chlamydia Tr: NOT DETECTED
N gonorrhoeae: NOT DETECTED

## 2022-03-22 LAB — LIPASE, BLOOD: Lipase: 37 U/L (ref 11–51)

## 2022-03-22 MED ORDER — PANTOPRAZOLE SODIUM 20 MG PO TBEC: 20.0000 mg | DELAYED_RELEASE_TABLET | Freq: Every day | ORAL | 0 refills | Status: AC

## 2022-03-22 MED ORDER — SUCRALFATE 1 G PO TABS: 1.0000 g | ORAL_TABLET | Freq: Three times a day (TID) | ORAL | 0 refills | Status: AC

## 2022-03-22 MED ORDER — SODIUM CHLORIDE 0.9 % IV BOLUS
1000.0000 mL | Freq: Once | INTRAVENOUS | Status: AC
  Administered 2022-03-22: 1000 mL via INTRAVENOUS

## 2022-03-22 MED ORDER — ONDANSETRON 4 MG PO TBDP: 4.0000 mg | ORAL_TABLET | Freq: Three times a day (TID) | ORAL | 0 refills | Status: AC | PRN

## 2022-03-22 MED ORDER — KETOROLAC TROMETHAMINE 15 MG/ML IJ SOLN
15.0000 mg | Freq: Once | INTRAMUSCULAR | Status: AC
  Administered 2022-03-22: 15 mg via INTRAVENOUS
  Filled 2022-03-22: qty 1

## 2022-03-22 MED ORDER — ONDANSETRON HCL 4 MG/2ML IJ SOLN
4.0000 mg | Freq: Once | INTRAMUSCULAR | Status: AC
  Administered 2022-03-22: 4 mg via INTRAVENOUS
  Filled 2022-03-22: qty 2

## 2022-03-22 MED ORDER — IOHEXOL 300 MG/ML  SOLN
100.0000 mL | Freq: Once | INTRAMUSCULAR | Status: AC | PRN
  Administered 2022-03-22: 100 mL via INTRAVENOUS

## 2022-03-22 MED ORDER — PANTOPRAZOLE SODIUM 40 MG IV SOLR
40.0000 mg | Freq: Once | INTRAVENOUS | Status: AC
  Administered 2022-03-22: 40 mg via INTRAVENOUS
  Filled 2022-03-22: qty 10

## 2022-03-22 MED ORDER — ACETAMINOPHEN 500 MG PO TABS
1000.0000 mg | ORAL_TABLET | Freq: Once | ORAL | Status: AC
Start: 2022-03-22 — End: 2022-03-22
  Administered 2022-03-22: 1000 mg via ORAL
  Filled 2022-03-22: qty 2

## 2022-03-22 NOTE — ED Provider Notes (Signed)
[facility] Provider Note    Event Date/Time   First MD Initiated Contact with Patient 03/22/22 1138     (approximate)   History   Abdominal Pain, Nausea, and Emesis   HPI  Mallarie Renshaw is a 46 y.o. female with history of eczema who comes in with concerns for abdominal pain.  Patient reports having some mid abdominal pain for the past 2 or 3 days associate with nausea, vomiting.  Denies any prior abdominal surgeries.  Denies any diarrhea.  Does report a little bit of a cough as well.  She reports taking ibuprofen at home without relief in symptoms.  She denies any chest pain, shortness of breath or any other concerns.  Denies any history of ovarian issues.  She reports being with 1 partner for the past 5 years and denies any new vaginal discharge.  Denies using tampons.  Denies any concerns for foreign bodies in her vagina.  Physical Exam   Triage Vital Signs: ED Triage Vitals  Enc Vitals Group     BP 03/22/22 0943 124/77     Pulse Rate 03/22/22 0943 72     Resp 03/22/22 0943 20     Temp 03/22/22 0943 97.8 F (36.6 C)     Temp Source 03/22/22 0943 Oral     SpO2 03/22/22 0943 99 %     Weight 03/22/22 0941 187 lb (84.8 kg)     Height 03/22/22 0941 '5\' 2"'$  (1.575 m)     Head Circumference --      Peak Flow --      Pain Score 03/22/22 0941 8     Pain Loc --      Pain Edu? --      Excl. in [place]? --     Most recent vital signs: Vitals:   03/22/22 0943  BP: 124/77  Pulse: 72  Resp: 20  Temp: 97.8 F (36.6 C)  SpO2: 99%     General: Awake, no distress.  CV:  Good peripheral perfusion.  Resp:  Normal effort.  Abd:  No distention.  Tender in the mid abdomen around the umbilicus area.  Without any rebound or guarding.  No significant pelvic tenderness Other:     ED Results / Procedures / Treatments   Labs (all labs ordered are listed, but only abnormal results are displayed) Labs Reviewed  COMPREHENSIVE METABOLIC PANEL - Abnormal;  Notable for the following components:      Result Value   Glucose, Bld 162 (*)    All other components within normal limits  URINALYSIS, ROUTINE W REFLEX MICROSCOPIC - Abnormal; Notable for the following components:   Color, Urine YELLOW (*)    APPearance CLOUDY (*)    Hgb urine dipstick MODERATE (*)    Leukocytes,Ua TRACE (*)    Squamous Epithelial / LPF >50 (*)    All other components within normal limits  WET PREP, GENITAL  CHLAMYDIA/NGC RT PCR ([facility] ONLY)            RESP PANEL BY RT-PCR (FLU A&B, COVID) ARPGX2  LIPASE, BLOOD  CBC  POC URINE PREG, ED     RADIOLOGY I have reviewed the xray personally and  interpreted and no evidence of any pneumonia  IMPRESSION: There is no evidence of intestinal obstruction or pneumoperitoneum. There is no hydronephrosis.   Gallbladder is contracted which may be physiological or suggest chronic cholecystitis. There are no imaging signs of acute cholecystitis. There is no dilation of bile ducts.  Other findings as described in the body of the report.   PROCEDURES:  Critical Care performed: No  Procedures   MEDICATIONS ORDERED IN ED: Medications  sodium chloride 0.9 % bolus 1,000 mL (has no administration in time range)  ondansetron (ZOFRAN) injection 4 mg (has no administration in time range)  acetaminophen (TYLENOL) tablet 1,000 mg (has no administration in time range)     IMPRESSION / MDM / ASSESSMENT AND PLAN / ED COURSE  I reviewed the triage vital signs and the nursing notes.   Patient's presentation is most consistent with acute presentation with potential threat to life or bodily function.   Patient comes in with abdominal pain.  Differential includes cholecystitis, pancreatitis, obstruction, appendicitis, diverticulitis.  Seems lower suspicion for ovarian pathology since the pain seems to be more mid abdomen.  Lipase is normal.  Present test negative.  CMP reassuring glucose was 162.  CBC reassuring.  Urine with  significant squamous cells so most likely no signs for infection.  We will get CT imaging to further evaluate as well as x-ray to evaluate for any signs of pneumonia given an occasional cough as well as COVID test.  Patient given some Tylenol, IV fluids, Zofran help with symptoms  CT imaging overall reassuring that did notice a contracted gallbladder which could be physiologic or suggest chronic cholecystitis.  Her COVID test was negative.  Wet prep negative  Patient denies any significant ibuprofen use, smoking, drug or alcohol use.   On repeat evaluation pt reports that her pain is improving.  Her CT scan was as above she now reports more pain and more epigastrically so I will order an ultrasound just to ensure no signs of cholecystitis but seems less likely.  She does report occasional ibuprofen use.  We discussed the possibility of this being viral versus get some gastritis.  If ultrasound is negative we will have her start medications for possible gastritis and have her follow-up outpatient with GI.  Patient expressed understanding and felt comfortable with plan.  Patient handed off to oncoming team pending ultrasound     FINAL CLINICAL IMPRESSION(S) / ED DIAGNOSES   Final diagnoses:  Nausea and vomiting, unspecified vomiting type  Atrophic gastritis without hemorrhage     Rx / DC Orders   ED Discharge Orders          Ordered    pantoprazole (PROTONIX) 20 MG tablet  Daily        03/22/22 1406    sucralfate (CARAFATE) 1 g tablet  3 times daily with meals & bedtime        03/22/22 1406    ondansetron (ZOFRAN-ODT) 4 MG disintegrating tablet  Every 8 hours PRN        03/22/22 1406             Note:  This document was prepared using Dragon voice recognition software and may include unintentional dictation errors.   Vanessa Angola, MD 03/22/22 1409

## 2022-03-22 NOTE — ED Provider Notes (Signed)
-----------------------------------------   3:36 PM on 03/22/2022 -----------------------------------------  Blood pressure 124/77, pulse 72, temperature 97.9 F (36.6 C), temperature source Oral, resp. rate 20, height '5\' 2"'$  (1.575 m), weight 84.8 kg, last menstrual period 03/21/2022, SpO2 99 %.  Assuming care from Dr. Jari Pigg.  In short, Susan Guerrero is a 46 y.o. female with a chief complaint of Abdominal Pain, Nausea, and Emesis .  Refer to the original H&P for additional details.  The current plan of care is to await ultrasound.  Patient presented to the emergency department initially with what appears to be periumbilical pain.  Patient had reassuring work-up with labs, CT scan.  There was some concern on CT of a contracted gallbladder which may be either chronic cholecystitis versus physiological findings.  On reassessment by previous provider patient started endorsing more epigastric pain so ultrasound was ordered.  We are currently awaiting ultrasound.  If findings are reassuring patient will be stable for discharge.  We will manage concerning findings accordingly.Marland Kitchen  ----------------------------------------- 4:07 PM on 03/22/2022 -----------------------------------------   Ultrasound returns with ongoing constricted gallbladder but no evidence of cholelithiasis or cholecystitis on ultrasound.  Suspect symptoms patient is experiencing to be viral in nature.  Will provide symptom control medication discharge.  Patient will have prescriptions for Zofran, Protonix and Carafate return precautions will be provided for the patient.    ED diagnosis:  Nausea and vomiting Atrophic gastritis without hemorrhage       Darletta Moll, PA-C 03/22/22 1615    Vanessa Allison, MD 03/23/22 [phone]

## 2022-03-22 NOTE — ED Triage Notes (Signed)
Pt reports general abd pain and NV for the past 2 days. Pt states pain is crampy and all over abd. Denies diarrhea or SOB

## 2022-03-22 NOTE — Discharge Instructions (Addendum)
Take Tylenol 1 g every 8 hours.  Avoid ibuprofen and smoking /marijuana/alcohol.  We are treating you as if this could be gastritis or inflammation of the stomach secondary to acid versus a viral illness.  Take the Protonix to help decrease acid Carafate to help line the stomach.  Call GI to make a follow-up appointment return to the ER if you develop worsening symptoms or any other concerns

## 2023-03-29 ENCOUNTER — Emergency Department
Admission: EM | Admit: 2023-03-29 | Discharge: 2023-03-29 | Disposition: A | Discharge status: Home / Self Care | Payer: Medicaid Other | Attending: Emergency Medicine | Admitting: Emergency Medicine

## 2023-03-29 ENCOUNTER — Emergency Department: Payer: Medicaid Other

## 2023-03-29 ENCOUNTER — Encounter: Payer: Self-pay | Admitting: *Deleted

## 2023-03-29 ENCOUNTER — Other Ambulatory Visit: Payer: Self-pay

## 2023-03-29 DIAGNOSIS — J45909 Unspecified asthma, uncomplicated: Secondary | ICD-10-CM | POA: Diagnosis not present

## 2023-03-29 DIAGNOSIS — N83202 Unspecified ovarian cyst, left side: Secondary | ICD-10-CM | POA: Insufficient documentation

## 2023-03-29 DIAGNOSIS — R1084 Generalized abdominal pain: Secondary | ICD-10-CM

## 2023-03-29 DIAGNOSIS — R109 Unspecified abdominal pain: Secondary | ICD-10-CM | POA: Diagnosis present

## 2023-03-29 DIAGNOSIS — K529 Noninfective gastroenteritis and colitis, unspecified: Secondary | ICD-10-CM | POA: Insufficient documentation

## 2023-03-29 DIAGNOSIS — E119 Type 2 diabetes mellitus without complications: Secondary | ICD-10-CM | POA: Diagnosis not present

## 2023-03-29 LAB — COMPREHENSIVE METABOLIC PANEL
ALT: 15 U/L (ref 0–44)
AST: 21 U/L (ref 15–41)
Albumin: 3.5 g/dL (ref 3.5–5.0)
Alkaline Phosphatase: 70 U/L (ref 38–126)
Anion gap: 9 (ref 5–15)
BUN: 10 mg/dL (ref 6–20)
CO2: 22 mmol/L (ref 22–32)
Calcium: 8.4 mg/dL — ABNORMAL LOW (ref 8.9–10.3)
Chloride: 108 mmol/L (ref 98–111)
Creatinine, Ser: 0.76 mg/dL (ref 0.44–1.00)
GFR, Estimated: >60 mL/min (ref >60)
Glucose, Bld: 105 mg/dL — ABNORMAL HIGH (ref 70–99)
Potassium: 3.8 mmol/L (ref 3.5–5.1)
Sodium: 139 mmol/L (ref 135–145)
Total Bilirubin: 0.4 mg/dL (ref 0.3–1.2)
Total Protein: 6.8 g/dL (ref 6.5–8.1)

## 2023-03-29 LAB — URINALYSIS, ROUTINE W REFLEX MICROSCOPIC
Bacteria, UA: NONE SEEN
Bilirubin Urine: NEGATIVE
Glucose, UA: NEGATIVE mg/dL
Ketones, ur: NEGATIVE mg/dL
Leukocytes,Ua: NEGATIVE
Nitrite: NEGATIVE
Protein, ur: NEGATIVE mg/dL
Specific Gravity, Urine: 1.027 (ref 1.005–1.030)
pH: 6 (ref 5.0–8.0)

## 2023-03-29 LAB — CBC
HCT: 40.2 % (ref 36.0–46.0)
Hemoglobin: 13 g/dL (ref 12.0–15.0)
MCH: 27.8 pg (ref 26.0–34.0)
MCHC: 32.3 g/dL (ref 30.0–36.0)
MCV: 85.9 fL (ref 80.0–100.0)
Platelets: 290 10*3/uL (ref 150–400)
RBC: 4.68 MIL/uL (ref 3.87–5.11)
RDW: 13.2 % (ref 11.5–15.5)
WBC: 7 10*3/uL (ref 4.0–10.5)
nRBC: 0 % (ref 0.0–0.2)

## 2023-03-29 LAB — POC URINE PREG, ED: Preg Test, Ur: NEGATIVE

## 2023-03-29 LAB — LIPASE, BLOOD: Lipase: 29 U/L (ref 11–51)

## 2023-03-29 MED ORDER — ONDANSETRON 4 MG PO TBDP: 4.0000 mg | ORAL_TABLET | Freq: Three times a day (TID) | ORAL | 0 refills | Status: AC | PRN

## 2023-03-29 MED ORDER — LOPERAMIDE HCL 2 MG PO CAPS: 2.0000 mg | ORAL_CAPSULE | ORAL | 0 refills | Status: AC | PRN

## 2023-03-29 MED ORDER — ONDANSETRON HCL 4 MG/2ML IJ SOLN
4.0000 mg | Freq: Once | INTRAMUSCULAR | Status: AC
  Administered 2023-03-29: 4 mg via INTRAVENOUS
  Filled 2023-03-29: qty 2

## 2023-03-29 MED ORDER — MORPHINE SULFATE (PF) 4 MG/ML IV SOLN: 4.0000 mg | Freq: Once | INTRAVENOUS | Status: DC

## 2023-03-29 MED ORDER — KETOROLAC TROMETHAMINE 30 MG/ML IJ SOLN
15.0000 mg | Freq: Once | INTRAMUSCULAR | Status: AC
  Administered 2023-03-29: 15 mg via INTRAVENOUS
  Filled 2023-03-29: qty 1

## 2023-03-29 MED ORDER — IOHEXOL 300 MG/ML  SOLN
100.0000 mL | Freq: Once | INTRAMUSCULAR | Status: AC | PRN
  Administered 2023-03-29: 100 mL via INTRAVENOUS

## 2023-03-29 NOTE — ED Provider Notes (Signed)
[facility] Provider Note    Event Date/Time   First MD Initiated Contact with Patient 03/29/23 1517     (approximate)   History   Chief Complaint Abdominal Pain   HPI  Susan Guerrero is a 47 y.o. female with past medical history of asthma and diabetes who presents to the ED complaining of abdominal pain.  Patient reports that she has had 1 week of worsening pain in both sides of her lower abdomen.  She describes the pain as sharp and constant, has been associated with vomiting and multiple episodes of diarrhea.  She denies any fevers and has not had any blood in her emesis or stool.  She states she was seen by her PCP for this problem last week and prescribed Zofran, symptoms seem to be getting better for a couple of days before worsening again yesterday.  She denies any dysuria, hematuria, or flank pain.     Physical Exam   Triage Vital Signs: ED Triage Vitals  Encounter Vitals Group     BP 03/29/23 1318 106/78     Systolic BP Percentile --      Diastolic BP Percentile --      Pulse Rate 03/29/23 1318 (!) 58     Resp 03/29/23 1318 15     Temp 03/29/23 1318 98 F (36.7 C)     Temp Source 03/29/23 1318 Oral     SpO2 03/29/23 1318 97 %     Weight --      Height --      Head Circumference --      Peak Flow --      Pain Score 03/29/23 1319 8     Pain Loc --      Pain Education --      Exclude from Growth Chart --     Most recent vital signs: Vitals:   03/29/23 1318  BP: 106/78  Pulse: (!) 58  Resp: 15  Temp: 98 F (36.7 C)  SpO2: 97%    Constitutional: Alert and oriented. Eyes: Conjunctivae are normal. Head: Atraumatic. Nose: No congestion/rhinnorhea. Mouth/Throat: Mucous membranes are moist.  Cardiovascular: Normal rate, regular rhythm. Grossly normal heart sounds.  2+ radial pulses bilaterally. Respiratory: Normal respiratory effort.  No retractions. Lungs CTAB. Gastrointestinal: Soft and tender to palpation in the bilateral  lower quadrants with no rebound or guarding. No distention. Musculoskeletal: No lower extremity tenderness nor edema.  Neurologic:  Normal speech and language. No gross focal neurologic deficits are appreciated.    ED Results / Procedures / Treatments   Labs (all labs ordered are listed, but only abnormal results are displayed) Labs Reviewed  COMPREHENSIVE METABOLIC PANEL - Abnormal; Notable for the following components:      Result Value   Glucose, Bld 105 (*)    Calcium 8.4 (*)    All other components within normal limits  URINALYSIS, ROUTINE W REFLEX MICROSCOPIC - Abnormal; Notable for the following components:   Color, Urine YELLOW (*)    APPearance HAZY (*)    Hgb urine dipstick MODERATE (*)    All other components within normal limits  LIPASE, BLOOD  CBC  POC URINE PREG, ED    RADIOLOGY CT abdomen/pelvis reviewed and interpreted by me with no inflammatory changes, focal fluid collections, or dilated bowel loops.  PROCEDURES:  Critical Care performed: No  Procedures   MEDICATIONS ORDERED IN ED: Medications  ondansetron (ZOFRAN) injection 4 mg (4 mg Intravenous Given 03/29/23 1547)  ketorolac (TORADOL)  30 MG/ML injection 15 mg (15 mg Intravenous Given 03/29/23 1547)  iohexol (OMNIPAQUE) 300 MG/ML solution 100 mL (100 mLs Intravenous Contrast Given 03/29/23 1557)     IMPRESSION / MDM / ASSESSMENT AND PLAN / ED COURSE  I reviewed the triage vital signs and the nursing notes.                              47 y.o. female with past medical history of diabetes and asthma who presents to the ED complaining of 1 week of increasing bilateral lower quadrant abdominal pain with nausea, vomiting, and diarrhea.  Patient's presentation is most consistent with acute presentation with potential threat to life or bodily function.  Differential diagnosis includes, but is not limited to, gastroenteritis, dehydration, electrolyte abnormality, AKI, UTI, kidney stone, appendicitis,  diverticulitis, colitis.  Patient nontoxic-appearing and in no acute distress, vital signs are unremarkable.  Her abdomen is soft but she does have tenderness to palpation of her bilateral lower quadrants.  Labs are reassuring with no significant anemia, leukocytosis, electrolyte abnormality, or AKI.  Pregnancy testing is negative and urinalysis shows no signs of infection.  Patient wishes to avoid narcotic pain medicine, will treat with IV Toradol and IV Zofran, reassess following CT of her abdomen/pelvis.  CT imaging shows left ovarian cyst but is otherwise unremarkable.  Pain does not seem to localize to the left side and symptoms seem unlikely to represent ovarian torsion.  Patient appropriate for outpatient management of suspected gastroenteritis, pain improved on reassessment.  She will be prescribed Zofran and loperamide, was counseled to return to the ED for new or worsening symptoms, otherwise follow-up with PCP for ultrasound of ovarian cyst.  Patient agrees with plan.      FINAL CLINICAL IMPRESSION(S) / ED DIAGNOSES   Final diagnoses:  Generalized abdominal pain  Gastroenteritis  Cyst of left ovary     Rx / DC Orders   ED Discharge Orders          Ordered    ondansetron (ZOFRAN-ODT) 4 MG disintegrating tablet  Every 8 hours PRN        03/29/23 1841    loperamide (IMODIUM) 2 MG capsule  As needed        03/29/23 1841             Note:  This document was prepared using Dragon voice recognition software and may include unintentional dictation errors.   Chesley Noon, MD 03/29/23 [phone]

## 2023-03-29 NOTE — ED Triage Notes (Signed)
Pt was seen at [facility] for 8/10 RLQ pain with n/v/d for a week.  Tenderness on exam at [facility].  Pt tells me that she had a negative flu and covid test at [facility] and was sent here for further evaluation.
